# Patient Record
Sex: Male | Born: 1970
Health system: Southern US, Community
[De-identification: ages and names within clinical notes are randomized; demographics above are authoritative.]

## PROBLEM LIST (undated history)

## (undated) DIAGNOSIS — D126 Benign neoplasm of colon, unspecified: Secondary | ICD-10-CM

## (undated) DIAGNOSIS — I10 Essential (primary) hypertension: Secondary | ICD-10-CM

## (undated) HISTORY — PX: POLYPECTOMY: SHX149

---

## 2005-11-18 ENCOUNTER — Emergency Department (HOSPITAL_COMMUNITY): Admission: EM | Admit: 2005-11-18 | Discharge: 2005-11-19 | Payer: Self-pay | Admitting: Emergency Medicine

## 2008-04-20 DIAGNOSIS — I10 Essential (primary) hypertension: Secondary | ICD-10-CM | POA: Insufficient documentation

## 2008-04-27 ENCOUNTER — Emergency Department (HOSPITAL_COMMUNITY): Admission: EM | Admit: 2008-04-27 | Discharge: 2008-04-27 | Payer: Self-pay | Admitting: Emergency Medicine

## 2008-08-11 ENCOUNTER — Encounter (INDEPENDENT_AMBULATORY_CARE_PROVIDER_SITE_OTHER): Payer: Self-pay | Admitting: Family Medicine

## 2008-08-11 ENCOUNTER — Ambulatory Visit: Payer: Self-pay | Admitting: Family Medicine

## 2008-08-11 ENCOUNTER — Observation Stay (HOSPITAL_COMMUNITY): Admission: EM | Admit: 2008-08-11 | Discharge: 2008-08-13 | Payer: Self-pay | Admitting: Emergency Medicine

## 2008-08-13 ENCOUNTER — Encounter (INDEPENDENT_AMBULATORY_CARE_PROVIDER_SITE_OTHER): Payer: Self-pay | Admitting: Family Medicine

## 2008-08-15 DIAGNOSIS — K226 Gastro-esophageal laceration-hemorrhage syndrome: Secondary | ICD-10-CM | POA: Insufficient documentation

## 2008-08-23 ENCOUNTER — Ambulatory Visit: Payer: Self-pay | Admitting: Family Medicine

## 2008-09-22 ENCOUNTER — Ambulatory Visit: Payer: Self-pay | Admitting: Family Medicine

## 2008-09-22 LAB — CONVERTED CEMR LAB
BUN: 18 mg/dL
Basophils Absolute: 0 K/uL
Basophils Relative: 1 %
CO2: 24 meq/L
Calcium: 10.2 mg/dL
Chloride: 101 meq/L
Creatinine, Ser: 1.3 mg/dL
Eosinophils Absolute: 0.3 K/uL
Eosinophils Relative: 5 %
Glucose, Bld: 94 mg/dL
HCT: 46.6 %
Hemoglobin: 15.1 g/dL
Lymphocytes Relative: 41 %
Lymphs Abs: 2.2 K/uL
MCHC: 32.4 g/dL
MCV: 100.2 fL — ABNORMAL HIGH
Monocytes Absolute: 0.4 K/uL
Monocytes Relative: 8 %
Neutro Abs: 2.3 K/uL
Neutrophils Relative %: 45 %
Platelets: 415 K/uL — ABNORMAL HIGH
Potassium: 5.3 meq/L
RBC: 4.65 M/uL
RDW: 13.1 %
Sodium: 140 meq/L
WBC: 5.2 10*3/microliter

## 2008-10-18 ENCOUNTER — Encounter (INDEPENDENT_AMBULATORY_CARE_PROVIDER_SITE_OTHER): Payer: Self-pay | Admitting: Family Medicine

## 2009-04-25 ENCOUNTER — Ambulatory Visit: Payer: Self-pay | Admitting: Physician Assistant

## 2009-04-27 LAB — CONVERTED CEMR LAB
BUN: 13 mg/dL (ref 6–23)
CO2: 16 meq/L — ABNORMAL LOW (ref 19–32)
Calcium: 9.7 mg/dL (ref 8.4–10.5)
Creatinine, Ser: 1.56 mg/dL — ABNORMAL HIGH (ref 0.40–1.50)
Glucose, Bld: 93 mg/dL (ref 70–99)

## 2009-05-02 ENCOUNTER — Ambulatory Visit: Payer: Self-pay | Admitting: Physician Assistant

## 2009-05-05 ENCOUNTER — Encounter (INDEPENDENT_AMBULATORY_CARE_PROVIDER_SITE_OTHER): Payer: Self-pay | Admitting: *Deleted

## 2009-05-09 ENCOUNTER — Ambulatory Visit: Payer: Self-pay | Admitting: Physician Assistant

## 2009-05-09 ENCOUNTER — Encounter (INDEPENDENT_AMBULATORY_CARE_PROVIDER_SITE_OTHER): Payer: Self-pay | Admitting: *Deleted

## 2009-05-09 LAB — CONVERTED CEMR LAB
CO2: 25 meq/L (ref 19–32)
Calcium: 9.8 mg/dL (ref 8.4–10.5)
Creatinine, Ser: 0.98 mg/dL (ref 0.40–1.50)

## 2009-09-05 ENCOUNTER — Telehealth: Payer: Self-pay | Admitting: Physician Assistant

## 2009-09-05 ENCOUNTER — Ambulatory Visit: Payer: Self-pay | Admitting: Physician Assistant

## 2009-09-05 LAB — CONVERTED CEMR LAB
Bilirubin Urine: NEGATIVE
Glucose, Urine, Semiquant: NEGATIVE
Ketones, urine, test strip: NEGATIVE
Nitrite: NEGATIVE
pH: 5.5

## 2009-09-14 ENCOUNTER — Ambulatory Visit: Payer: Self-pay | Admitting: Physician Assistant

## 2009-09-14 LAB — CONVERTED CEMR LAB: Rapid HIV Screen: NEGATIVE

## 2009-09-15 LAB — CONVERTED CEMR LAB
AST: 57 units/L — ABNORMAL HIGH (ref 0–37)
Albumin: 4.8 g/dL (ref 3.5–5.2)
Alkaline Phosphatase: 82 units/L (ref 39–117)
Basophils Absolute: 0 10*3/uL (ref 0.0–0.1)
Glucose, Bld: 73 mg/dL (ref 70–99)
Hemoglobin: 15.7 g/dL (ref 13.0–17.0)
LDL Cholesterol: 122 mg/dL — ABNORMAL HIGH (ref 0–99)
Lymphocytes Relative: 42 % (ref 12–46)
Monocytes Absolute: 0.4 10*3/uL (ref 0.1–1.0)
Neutro Abs: 2.1 10*3/uL (ref 1.7–7.7)
Potassium: 4.7 meq/L (ref 3.5–5.3)
RDW: 14.1 % (ref 11.5–15.5)
Sodium: 139 meq/L (ref 135–145)
TSH: 2.267 microintl units/mL (ref 0.350–4.500)
Total Protein: 8.7 g/dL — ABNORMAL HIGH (ref 6.0–8.3)
Vit D, 25-Hydroxy: 13 ng/mL — ABNORMAL LOW (ref 30–89)
WBC: 4.6 10*3/uL (ref 4.0–10.5)

## 2009-09-19 ENCOUNTER — Encounter (INDEPENDENT_AMBULATORY_CARE_PROVIDER_SITE_OTHER): Payer: Self-pay | Admitting: *Deleted

## 2009-12-08 ENCOUNTER — Encounter (INDEPENDENT_AMBULATORY_CARE_PROVIDER_SITE_OTHER): Payer: Self-pay | Admitting: *Deleted

## 2009-12-21 ENCOUNTER — Encounter (INDEPENDENT_AMBULATORY_CARE_PROVIDER_SITE_OTHER): Payer: Self-pay | Admitting: *Deleted

## 2009-12-26 ENCOUNTER — Ambulatory Visit: Payer: Self-pay | Admitting: Gastroenterology

## 2010-01-16 ENCOUNTER — Ambulatory Visit: Payer: Self-pay | Admitting: Gastroenterology

## 2010-01-18 ENCOUNTER — Encounter: Payer: Self-pay | Admitting: Gastroenterology

## 2010-01-20 ENCOUNTER — Encounter: Payer: Self-pay | Admitting: Physician Assistant

## 2010-01-20 DIAGNOSIS — D126 Benign neoplasm of colon, unspecified: Secondary | ICD-10-CM

## 2010-01-20 HISTORY — DX: Benign neoplasm of colon, unspecified: D12.6

## 2010-02-13 ENCOUNTER — Telehealth: Payer: Self-pay | Admitting: Physician Assistant

## 2010-02-15 ENCOUNTER — Encounter: Payer: Self-pay | Admitting: Physician Assistant

## 2010-03-09 ENCOUNTER — Telehealth (INDEPENDENT_AMBULATORY_CARE_PROVIDER_SITE_OTHER): Payer: Self-pay | Admitting: Nurse Practitioner

## 2010-06-22 NOTE — Progress Notes (Signed)
Summary: GI referral  Phone Note Outgoing Call   Summary of Call: Patient needs referral to GI for consideration of screening colo.  He has a FHx of colon CA (his brother died in his 4s).  Initial call taken by: Tereso Newcomer PA-C,  September 05, 2009 9:55 AM

## 2010-06-22 NOTE — Progress Notes (Signed)
Summary: Office Visit//DEPRESSION SCREENING  Office Visit//DEPRESSION SCREENING   Imported By: Arta Bruce 11/02/2009 15:15:48  _____________________________________________________________________  External Attachment:    Type:   Image     Comment:   External Document

## 2010-06-22 NOTE — Miscellaneous (Signed)
  Clinical Lists Changes  Problems: Added new problem of COLONIC POLYPS, BENIGN (ICD-211.3) - 12/2009: path benign; rpt 2016 Observations: Added new observation of PAST MED HX: Hypertension (04/20/2008) h/o gout Colonoscopy 12/2009:  Polyps (path benign) . . . repeat 2016 (01/20/2010 13:17) Added new observation of PRIMARY MD: Tereso Newcomer PA-C (01/20/2010 13:17)        Past History:  Past Medical History: Hypertension (04/20/2008) h/o gout Colonoscopy 12/2009:  Polyps (path benign) . . . repeat 2016

## 2010-06-22 NOTE — Miscellaneous (Signed)
Summary: DIR COL/Previsit/rm  Clinical Lists Changes  Medications: Added new medication of MOVIPREP 100 GM  SOLR (PEG-KCL-NACL-NASULF-NA ASC-C) As per prep instructions. - Signed Rx of MOVIPREP 100 GM  SOLR (PEG-KCL-NACL-NASULF-NA ASC-C) As per prep instructions.;  #1 x 0;  Signed;  Entered by: Sherren Kerns RN;  Authorized by: Mardella Layman MD [facility];  Method used: Electronically to [facility] [address], [place], [place], [place]  [address], Ph: [phone], Fax: [phone] Observations: Added new observation of ALLERGY REV: Done (12/26/2009 8:34)    Prescriptions: MOVIPREP 100 GM  SOLR (PEG-KCL-NACL-NASULF-NA ASC-C) As per prep instructions.  #1 x 0   Entered by:   Sherren Kerns RN   Authorized by:   Mardella Layman MD [facility]   Signed by:   Sherren Kerns RN on 12/26/2009   Method used:   Electronically to        [facility] (retail)       [address]       [place], [place]  [address]       Ph: [phone]       Fax: [phone]   RxID:   [id number]

## 2010-06-22 NOTE — Letter (Signed)
Summary: Generic Letter  [facility]  [address] [place], [place] [address]   Phone: [phone]  Fax: [phone]        02/15/2010  [name] [name] [address] [place], [place]  [address]  Dear Mr. DANTONIO,  We have been unable to contact you by telephone.  Please call our office, at your earliest convenience, so that we may speak with you about your medication.  Sincerely,   Dutch Quint RN

## 2010-06-22 NOTE — Progress Notes (Signed)
Summary: Query:  Refill lisinopril/HCTZ?  Phone Note Outgoing Call   Summary of Call: Do you want to fill his lisinopril/hctz?  Last seen 08/2009. Initial call taken by: Dutch Quint RN,  February 13, 2010 3:25 PM  Follow-up for Phone Call        last filled in Dec. for 6 mos has he been taking it? Follow-up by: Tereso Newcomer PA-C,  February 14, 2010 1:41 PM  Additional Follow-up for Phone Call Additional follow up Details #1::        Unable to leave message -- no answer.  Dutch Quint RN  February 14, 2010 2:41 PM  Unable to leave message -- no answer.  Letter sent.  Dutch Quint RN  February 15, 2010 10:17 AM

## 2010-06-22 NOTE — Letter (Signed)
Summary: *HSN Results Follow up  [facility]  [address] [place], [place] [address]   Phone: [phone]  Fax: [phone]      09/19/2009   GIDEON BURSTEIN [address] [place], [place]  [address]   Dear  Mr. Jaiel Dunkerson,                            ____S.Drinkard,FNP   ____D. Gore,FNP       ____B. McPherson,MD   ____V. Rankins,MD    ____E. Mulberry,MD    ____N. Daphine Deutscher, FNP  ____D. Reche Dixon, MD    ____K. Philipp Deputy, MD    ____Other     This letter is to inform you that your recent test(s):  _______Pap Smear    _______Lab Test     _______X-ray    _______ is within acceptable limits  _______ requires a medication change  _______ requires a follow-up lab visit  _______ requires a follow-up visit with your provider   Comments:  We have been trying to reach you.  Please give the office a call at your earliest convenience.       _________________________________________________________ If you have any questions, please contact our office                     Sincerely,  Armenia Shannon [facility]

## 2010-06-22 NOTE — Assessment & Plan Note (Signed)
Summary: 4 MONTH FU FOR CPE////KT   Vital Signs:  Patient profile:   40 year old male Height:      76 inches Weight:      210 pounds BMI:     25.65 Temp:     98.2 degrees F oral Pulse rate:   111 / minute Pulse rhythm:   regular Resp:     18 per minute BP sitting:   133 / 84  (left arm) Cuff size:   large  Vitals Entered By: [place] Shannon (September 05, 2009 9:04 AM) CC: CPE..., Hypertension Management Is Patient Diabetic? No Pain Assessment Patient in pain? no       Does patient need assistance? Functional Status Self care Ambulation Normal   Primary Care Provider:  Tereso Newcomer PA-C  CC:  CPE... and Hypertension Management.  History of Present Illness: Here for CPE.  HTN:  Taking meds.  Trying to watch salt intake.    H/o Mallory Weis tear in past:  No vomiting blood.  Health Maint: Td up to date. Needs Lipid, Vit D. Notes h/o colon CA in brother in his 27s.   Hypertension History:      He denies headache, chest pain, palpitations, dyspnea with exertion, and syncope.        Positive major cardiovascular risk factors include hypertension.  Negative major cardiovascular risk factors include male age less than 27 years old and non-tobacco-user status.     Habits & Providers  Alcohol-Tobacco-Diet     Alcohol drinks/day: varies     Alcohol type: beer     Feels need to cut down: yes     Feels annoyed by complaints: no     Feels guilty re: drinking: no     Needs 'eye opener' in am: no     Tobacco Status: never  Exercise-Depression-Behavior     Have you felt down or hopeless? no     Have you felt little pleasure in things? 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 Take 1 Tablet By Mouth Once A Day For Blood Pressure 2)  Robaxin-750 750 Mg Tabs (Methocarbamol) .Marland Kitchen.. 1-2 Tabs By Mouth Every 8 Hours As Needed For Pain  Allergies (verified): No Known Drug Allergies  Past History:  Past Medical History: Hypertension (04/20/2008) h/o gout  Past Surgical History: Reviewed history from 08/23/2008 and no changes required. Denies surgical history  Family History: Mother living with borderline DM Father deceased age 67 ?GI bleed Pt says no alcoholism. Breast CA - mom Colon CA - brother (40) Family History of Colon CA 1st degree relative <60  Social History: Occupation:worked as a pressure washer...currently unemployed Single Never Smoked Alcohol use-yes.Marland KitchenMarland KitchenReports occ. beers per day (drank 6 beers yest) Drug use-no Drug Use:  never Seat Belt Use:  always  Review of Systems      See HPI General:  Denies chills and fever. Eyes:  Denies blurring. ENT:  Denies sore throat. CV:  Denies chest pain or discomfort and shortness of breath with exertion. Resp:  Denies cough. GI:  Denies bloody stools and dark tarry stools. GU:  Denies hematuria. MS:  Complains of joint  pain; c/o gout in left great toe. Derm:  Denies rash. Psych:  Denies depression. Endo:  Denies cold intolerance and heat intolerance. Heme:  Denies bleeding.   Impression & Recommendations:  Problem # 1:  Preventive Health Care (ICD-V70.0)  PHQ9=0 bring back for fasting labs  Orders: UA Dipstick w/o Micro (manual) ([address]) Hemoccult Guaiac-1 spec.(in office) ([address]) Hemoccult Cards -3 specimans (take home) ([address])  Problem # 2:  HYPERTENSION (ICD-401.9) controlled  His updated medication list for this problem includes:    Lisinopril-hydrochlorothiazide 20-25 Mg Tabs (Lisinopril-hydrochlorothiazide) .Marland Kitchen... 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 Take 1 tablet by mouth once a day for blood pressure  Hypertension Assessment/Plan:      The patient's hypertensive risk group is category A: No risk factors and no target organ damage.  Today's blood pressure is 133/84.  His blood pressure goal is < 140/90.  Patient Instructions: 1)  Return to the lab fasting (nothing to eat or drink after midnight the night before except water) in the next 1-2 weeks: 2)  CMET, Lipids, CBC, HIV, TSH, RPR, Vitamin D (25 Hydroxy). 3)  Dx:  V70.0, 401.1 4)  Complete stool cards and send back. 5)  I will set you up to see a Gastroenterologist to discuss doing a colonoscopy.  Somone will call you. 6)  Please schedule a follow-up appointment in 4 months with Scott for blood pressure.    EKG  Procedure date:  09/05/2009  Findings:      Sinus tachycardia with rate of:  110 normal axis LVH j point elevation   Laboratory Results   Urine Tests  Date/Time Received: September 05, 2009 1:46 PM   Routine Urinalysis   Glucose: negative   (Normal Range: Negative) Bilirubin: negative   (Normal Range: Negative) Ketone: negative   (Normal Range: Negative) Spec. Gravity: >=1.030   (Normal Range: 1.003-1.035) Blood: negative   (Normal Range: Negative) pH: 5.5   (Normal Range: 5.0-8.0) Protein: negative   (Normal Range: Negative) Urobilinogen: 0.2   (Normal Range: 0-1) Nitrite: negative   (Normal Range: Negative) Leukocyte Esterace: negative   (Normal Range: Negative)        Appended Document: 4 MONTH FU FOR CPE////KT    Clinical Lists Changes  Orders: Added new  Service order of Est. Patient age 89-64 [phone]) - Signed

## 2010-06-22 NOTE — Progress Notes (Signed)
Summary: Query:  Refill lisinopril-HCTZ?  Phone Note Outgoing Call   Summary of Call: Got request to refill his lisinopril/HCTZ and Lorin Picket wanted to know if he was taking his meds consistently -- pt. states that he has.  Do you want to refill his med? Initial call taken by: Dutch Quint RN,  March 09, 2010 10:18 AM  Follow-up for Phone Call        Yes, fill for 6 months Follow-up by: Julieanne Manson MD,  March 10, 2010 5:47 PM  Additional Follow-up for Phone Call Additional follow up Details #1::        Refill completed.  Left message with family member for pt. to return call.  Dutch Quint RN  March 13, 2010 5:32 PM     Additional Follow-up for Phone Call Additional follow up Details #2::    SPOKE WITH PATIENT AND I INFORMED HIM THAT HIS BP MEDICATION WAS SENT TO [facility]. Follow-up by: Leodis Rains,  March 14, 2010 9:31 AM  Prescriptions: LISINOPRIL-HYDROCHLOROTHIAZIDE 20-25 MG TABS (LISINOPRIL-HYDROCHLOROTHIAZIDE) Take 1 tablet by mouth once a day for blood pressure  #30 x 5   Entered by:   Dutch Quint RN   Authorized by:   Julieanne Manson MD   Signed by:   Dutch Quint RN on 03/13/2010   Method used:   Electronically to        [facility]retail)       [address]       [place], [place]  [address]       Ph: [phone]       Fax: [phone]   RxID:   [id number]

## 2010-06-22 NOTE — Letter (Signed)
Summary: Previsit letter  [facility]  [address] [place], [place] [address]   Phone: [phone]  Fax: [phone]       12/08/2009 MRN: [id number]  [facility] [address] [place], [place]  [address]  Dear Mr. WOLLMAN,  Welcome to the Gastroenterology Division at [facility].    You are scheduled to see a nurse for your pre-procedure visit on 12/26/2009 at 8:30AM on the 3rd floor at [facility], [address]  We ask that you try to arrive at our office 15 minutes prior to your appointment time to allow for check-in.  Your nurse visit will consist of discussing your medical and surgical history, your immediate family medical history, and your medications.    Please bring a complete list of all your medications or, if you prefer, bring the medication bottles and we will list them.  We will need to be aware of both prescribed and over the counter drugs.  We will need to know exact dosage information as well.  If you are on blood thinners (Coumadin, Plavix, Aggrenox, Ticlid, etc.) please call our office today/prior to your appointment, as we need to consult with your physician about holding your medication.   Please be prepared to read and sign documents such as consent forms, a financial agreement, and acknowledgement forms.  If necessary, and with your consent, a friend or relative is welcome to sit-in on the nurse visit with you.  Please bring your insurance card so that we may make a copy of it.  If your insurance requires a referral to see a specialist, please bring your referral form from your primary care physician.  No co-pay is required for this nurse visit.     If you cannot keep your appointment, please call [phone] to cancel or reschedule prior to your appointment date.  This allows [place] the opportunity to schedule an appointment for another patient in need of care.    Thank you for choosing [facility] for your medical  needs.  We appreciate the opportunity to care for you.  Please visit [place] at our website  to learn more about our practice.                     Sincerely.                                                                                                                   The Gastroenterology Division

## 2010-06-22 NOTE — Procedures (Signed)
Summary: Colonoscopy  Patient: Hezakiah Champeau Note: All result statuses are Final unless otherwise noted.  Tests: (1) Colonoscopy (COL)   COL Colonoscopy           DONE     [facility]     [address]     [place], [place]  [address]           COLONOSCOPY PROCEDURE REPORT           PATIENT:  Damichael, Hofman  MR#:  [id number]     BIRTHDATE:  10/31/1970, 38 yrs. old  GENDER:  male     ENDOSCOPIST:  Vania Rea. Jarold Motto, MD, [facility]     REF. BY:     PROCEDURE DATE:  01/16/2010     PROCEDURE:  Colonoscopy with snare polypectomy     ASA CLASS:  Class II     INDICATIONS:  family history of colon cancer BROTHER     MEDICATIONS:   Fentanyl 50 mcg IV, Versed 5 mg IV           DESCRIPTION OF PROCEDURE:   After the risks benefits and     alternatives of the procedure were thoroughly explained, informed     consent was obtained.  Digital rectal exam was performed and     revealed no abnormalities.   The LB [id number] endoscope     was introduced through the anus and advanced to the cecum, which     was identified by both the appendix and ileocecal valve, without     limitations.  The quality of the prep was excellent, using     MoviPrep.  The instrument was then slowly withdrawn as the colon     was fully examined.     <<PROCEDUREIMAGES>>           FINDINGS:  A sessile polyp was found in the ascending colon. 5 MM     POLYP HOT SNARE EXCISED.  This was otherwise a normal examination     of the colon.   Retroflexed views in the rectum revealed no     abnormalities.    The scope was then withdrawn from the patient     and the procedure completed.           COMPLICATIONS:  None     ENDOSCOPIC IMPRESSION:     1) Sessile polyp in the ascending colon     2) Otherwise normal examination     R/O ADENOMA.     RECOMMENDATIONS:     1) Repeat Colonoscopy in 5 years.     REPEAT EXAM:  No           ______________________________     Vania Rea. Jarold Motto, MD, [facility]           CC:   Tereso Newcomer PA           n.     eSIGNED:   Vania Rea. Patterson at 01/16/2010 11:56 AM           Teresa Pelton, [id number]  Note: An exclamation mark (!) indicates a result that was not dispersed into the flowsheet. Document Creation Date: 01/16/2010 11:58 AM _______________________________________________________________________  (1) Order result status: Final Collection or observation date-time: 01/16/2010 11:50 Requested date-time:  Receipt date-time:  Reported date-time:  Referring Physician:   Ordering Physician: Sheryn Bison [phone]) Specimen Source:  Source: Launa Grill Order Number: [id number] Lab site:   Appended Document: Colonoscopy     Procedures Next Due Date:  Colonoscopy: 01/2015

## 2010-06-22 NOTE — Letter (Signed)
Summary: Patient Notice- Polyp Results  [facility]  [address] [place], [place] [address]   Phone: [phone]  Fax: [phone]        January 18, 2010 MRN: [id number]    [name] [name] [address] [place], [place]  [address]    Dear [name] [name],  I am pleased to inform you that the colon polyp(s) removed during your recent colonoscopy was (were) found to be benign (no cancer detected) upon pathologic examination.  I recommend you have a repeat colonoscopy examination in 5_ years to look for recurrent polyps, as having colon polyps increases your risk for having recurrent polyps or even colon cancer in the future.  Should you develop new or worsening symptoms of abdominal pain, bowel habit changes or bleeding from the rectum or bowels, please schedule an evaluation with either your primary care physician or with me.  Additional information/recommendations:  _X_ No further action with gastroenterology is needed at this time. Please      follow-up with your primary care physician for your other healthcare      needs.POLYP WAS HYPERPLASTIC,NOT AN ADENOMA.5 YEAR FOLLOWUP PER FAMILY HISTORY. __ Please call [phone] to schedule a return visit to review your      situation.  __ Please keep your follow-up visit as already scheduled.  __ Continue treatment plan as outlined the day of your exam.  Please call us if you are having persistent problems or have questions about your condition that have not been fully answered at this time.  Sincerely,  Mardella Layman MD [facility]  This letter has been electronically signed by your physician.  Appended Document: Patient Notice- Polyp Results letter mailed 9.2.11

## 2010-06-22 NOTE — Letter (Signed)
Summary: [facility] Instructions  [facility]  [address] [place], [place] [address]   Phone: [phone]  Fax: [phone]       [name] [name]    [birth year]    MRN: [id number]        Procedure Day /Date:  Monday 01/16/2010     Arrival Time: 10:00 am      Procedure Time: 11:00 am     Location of Procedure:                    _x _  [facility] (4th Floor)                        PREPARATION FOR COLONOSCOPY WITH MOVIPREP   Starting 5 days prior to your procedure Wednesday 8/24 do not eat nuts, seeds, popcorn, corn, beans, peas,  salads, or any raw vegetables.  Do not take any fiber supplements (e.g. Metamucil, Citrucel, and Benefiber).  THE DAY BEFORE YOUR PROCEDURE         DATE: Sunday 8/28  1.  Drink clear liquids the entire day-NO SOLID FOOD  2.  Do not drink anything colored red or purple.  Avoid juices with pulp.  No orange juice.  3.  Drink at least 64 oz. (8 glasses) of fluid/clear liquids during the day to prevent dehydration and help the prep work efficiently.  CLEAR LIQUIDS INCLUDE: Water Jello Ice Popsicles Tea (sugar ok, no milk/cream) Powdered fruit flavored drinks Coffee (sugar ok, no milk/cream) Gatorade Juice: apple, white grape, white cranberry  Lemonade Clear bullion, consomm, broth Carbonated beverages (any kind) Strained chicken noodle soup Hard Candy                             4.  In the morning, mix first dose of MoviPrep solution:    Empty 1 Pouch A and 1 Pouch B into the disposable container    Add lukewarm drinking water to the top line of the container. Mix to dissolve    Refrigerate (mixed solution should be used within 24 hrs)  5.  Begin drinking the prep at 5:00 p.m. The MoviPrep container is divided by 4 marks.   Every 15 minutes drink the solution down to the next mark (approximately 8 oz) until the full liter is complete.   6.  Follow completed prep with 16 oz of clear liquid of your choice  (Nothing red or purple).  Continue to drink clear liquids until bedtime.  7.  Before going to bed, mix second dose of MoviPrep solution:    Empty 1 Pouch A and 1 Pouch B into the disposable container    Add lukewarm drinking water to the top line of the container. Mix to dissolve    Refrigerate  THE DAY OF YOUR PROCEDURE      DATE: Monday 8/29  Beginning at 6:00 a.m. (5 hours before procedure):         1. Every 15 minutes, drink the solution down to the next mark (approx 8 oz) until the full liter is complete.  2. Follow completed prep with 16 oz. of clear liquid of your choice.    3. You may drink clear liquids until 9:00 am (2 HOURS BEFORE PROCEDURE).   MEDICATION INSTRUCTIONS  Unless otherwise instructed, you should take regular prescription medications with a small sip of water   as early as possible the morning of  your procedure.   Additional medication instructions: hold BP pill morning of procedure only         OTHER INSTRUCTIONS  You will need a responsible adult at least 42 years of age to accompany you and drive you home.   This person must remain in the waiting room during your procedure.  Wear loose fitting clothing that is easily removed.  Leave jewelry and other valuables at home.  However, you may wish to bring a book to read or  an iPod/MP3 player to listen to music as you wait for your procedure to start.  Remove all body piercing jewelry and leave at home.  Total time from sign-in until discharge is approximately 2-3 hours.  You should go home directly after your procedure and rest.  You can resume normal activities the  day after your procedure.  The day of your procedure you should not:   Drive   Make legal decisions   Operate machinery   Drink alcohol   Return to work  You will receive specific instructions about eating, activities and medications before you leave.    The above instructions have been reviewed and explained to me  by   Sherren Kerns RN  December 26, 2009 8:53 AM    I fully understand and can verbalize these instructions _____________________________ Date _________

## 2010-08-31 LAB — SAMPLE TO BLOOD BANK

## 2010-08-31 LAB — CBC
HCT: 33.1 % — ABNORMAL LOW (ref 39.0–52.0)
HCT: 37.9 % — ABNORMAL LOW (ref 39.0–52.0)
Hemoglobin: 12.8 g/dL — ABNORMAL LOW (ref 13.0–17.0)
Hemoglobin: 15.9 g/dL (ref 13.0–17.0)
MCHC: 33.5 g/dL (ref 30.0–36.0)
MCHC: 33.8 g/dL (ref 30.0–36.0)
MCV: 95.7 fL (ref 78.0–100.0)
MCV: 96.1 fL (ref 78.0–100.0)
Platelets: 246 10*3/uL (ref 150–400)
RBC: 3.45 MIL/uL — ABNORMAL LOW (ref 4.22–5.81)
RBC: 3.95 MIL/uL — ABNORMAL LOW (ref 4.22–5.81)
RBC: 4.98 MIL/uL (ref 4.22–5.81)
RDW: 12.5 % (ref 11.5–15.5)
WBC: 6.3 10*3/uL (ref 4.0–10.5)

## 2010-08-31 LAB — COMPREHENSIVE METABOLIC PANEL
ALT: 39 U/L (ref 0–53)
AST: 20 U/L (ref 0–37)
Albumin: 3.2 g/dL — ABNORMAL LOW (ref 3.5–5.2)
Alkaline Phosphatase: 53 U/L (ref 39–117)
CO2: 25 mEq/L (ref 19–32)
CO2: 26 mEq/L (ref 19–32)
Calcium: 9.4 mg/dL (ref 8.4–10.5)
Chloride: 110 mEq/L (ref 96–112)
Creatinine, Ser: 1.08 mg/dL (ref 0.4–1.5)
Creatinine, Ser: 1.14 mg/dL (ref 0.4–1.5)
GFR calc Af Amer: >60 mL/min (ref >60)
GFR calc non Af Amer: >60 mL/min (ref >60)
GFR calc non Af Amer: >60 mL/min (ref >60)
Glucose, Bld: 119 mg/dL — ABNORMAL HIGH (ref 70–99)
Potassium: 3.6 mEq/L (ref 3.5–5.1)
Total Bilirubin: 0.9 mg/dL (ref 0.3–1.2)

## 2010-08-31 LAB — HEMOGLOBIN
Hemoglobin: 11.1 g/dL — ABNORMAL LOW (ref 13.0–17.0)
Hemoglobin: 11.8 g/dL — ABNORMAL LOW (ref 13.0–17.0)

## 2010-08-31 LAB — DIFFERENTIAL
Basophils Absolute: 0 10*3/uL (ref 0.0–0.1)
Eosinophils Absolute: 0.1 10*3/uL (ref 0.0–0.7)
Lymphs Abs: 2.3 10*3/uL (ref 0.7–4.0)
Neutrophils Relative %: 53 % (ref 43–77)

## 2010-08-31 LAB — CROSSMATCH

## 2010-08-31 LAB — PROTIME-INR
INR: 1.1 (ref 0.00–1.49)
Prothrombin Time: 14 seconds (ref 11.6–15.2)

## 2010-08-31 LAB — HEMATOCRIT: HCT: 32 % — ABNORMAL LOW (ref 39.0–52.0)

## 2010-08-31 LAB — POCT I-STAT, CHEM 8
BUN: 31 mg/dL — ABNORMAL HIGH (ref 6–23)
Chloride: 106 mEq/L (ref 96–112)
Creatinine, Ser: 1.1 mg/dL (ref 0.4–1.5)
Glucose, Bld: 117 mg/dL — ABNORMAL HIGH (ref 70–99)
Potassium: 4.1 mEq/L (ref 3.5–5.1)
Sodium: 141 mEq/L (ref 135–145)

## 2010-10-03 NOTE — Op Note (Signed)
NAME:  [name] [name], [name] [name] NO.:  [id number]   MEDICAL RECORD NO.:  [id number]          PATIENT TYPE:  OBV   LOCATION:  5509                         FACILITY:  [facility]   PHYSICIAN:  Graylin Shiver, M.D.   DATE OF BIRTH:  15-Jul-1970   DATE OF PROCEDURE:  DATE OF DISCHARGE:                               OPERATIVE REPORT   Esophagogastroduodenoscopy with control of hemorrhage.   INDICATIONS FOR PROCEDURE:  Hematemesis.   Informed consent was obtained after explanation of the risks of  bleeding, infection, and perforation.   PREMEDICATIONS:  1. Fentanyl 100 mcg IV.  2. Versed 10 mg IV.  3. Benadryl 25 mg IV.   PROCEDURE:  With the patient in the left lateral decubitus position, the  Pentax gastroscope was inserted into the oropharynx and passed into the  esophagus.  It was advanced down the esophagus and then into the stomach  and into the duodenum.  The second portion and bulb of the duodenum  looked normal.  The stomach had blood in it.  No lesions were seen in  the antrum or in the body of the stomach that could be seen, but again  there was blood and clot in the stomach.  Upon inspection of the fundus  and cardia, it was noted that there was an adherent clot right at the  level of the esophagogastric junction, and just below the level of the  esophagogastric junction on the lesser curvature side, there was an area  where I could see bleeding.  This is suspect for a Mallory-Weiss tear.  Because of the ongoing bleeding, I injected this area with 5 mL of  1:10,000 epinephrine and the bleeding stopped.  No other bleeding was  seen.  I did not see a specific point to clip.  It was just bleeding  from around this area just beyond the esophagogastric junction.  The  esophagus otherwise looked normal.  He tolerated the procedure well  without complications.   IMPRESSION:  Active bleeding from a suspect Mallory-Weiss tear, injected  with 5 mL of 1:10,000 epinephrine and  bleeding stopped.           ______________________________  Graylin Shiver, M.D.     SFG/MEDQ  D:  08/12/2008  T:  08/12/2008  Job:  [id number]

## 2010-10-03 NOTE — Discharge Summary (Signed)
NAMEMarland Kitchen  [name], [name] NO.:  [id number]   MEDICAL RECORD NO.:  [id number]          PATIENT TYPE:  OBV   LOCATION:  5509                         FACILITY:  [facility]   PHYSICIAN:  [name] Ramp, MD        DATE OF BIRTH:  25-Oct-1970   DATE OF ADMISSION:  08/11/2008  DATE OF DISCHARGE:  08/13/2008                               DISCHARGE SUMMARY   PRIMARY CARE [name] [name]:  Dr. Luciana [name] at [facility].   DISCHARGE DIAGNOSES:  1. Mallory-Weiss tear at the esophagogastric junction.  2. Hypertension.  3. Alcohol abuse.   DISCHARGE MEDICATIONS:  1. Enalapril 5 mg p.o. daily.  2. Hydrochlorothiazide 12.5 mg p.o. daily.  3. Multivitamin p.o. daily.   The patient is instructed to not use any NSAIDs such as Advil, Motrin,  ibuprofen, etc, only use Tylenol for headache.   CONSULTS:  The patient had a GI consult and EGD on August 11, 2008.   The patient had no radiology images done, but he did have an EGD done on  August 11, 2008.  He was found to have a Mallory-Weiss tear at the EG  suction.  It was injected with lidocaine and the patient had no further  bleeding during his hospitalization.  This was done by Dr. Evette [name].   LABORATORY DATA PERTINENT DURING THIS HOSPITALIZATION:  The patient was  admitted.  He was fecal occult blood positive.  He had a CBC that showed  that he had a white blood cell count of 6.1, hemoglobin of 15.9,  hematocrit 47.7, and platelets of 332.  The patient had a PT of 14 and  INR of 1.1 and PTT of 30.  He had a comprehensive metabolic panel that  showed that his BUN was little elevated at 24, glucose was 119, and his  total bilirubin was 1.5, otherwise within normal limits.  His alcohol  level was less than 5.  He was typed and crossed and the patient has O+  blood type.  The patient continued to have a CBCs and  hemoglobin/hematocrit checks throughout his hospitalization on the day  prior to hospitalization.  The patient's complete metabolic panel was  only significant for a glucose of 100 and albumin of 3.2, otherwise was  completely normal level.  The patient had a final hemoglobin on the day  of discharge on August 13, 2008, with a hemoglobin of 9.7.  This had  dropped down from his admission hemoglobin of 15.9.  However, on the day  of discharge, the patient had stopped bleeding and was recommended to  start on a multivitamin which will get a prescription for.   BRIEF HOSPITAL COURSE AND SUMMARY:  This is a 40 year old African  American male with a history of hypertension and alcohol use who had  presented with hematemesis and black-tarry stools since the 23rd.  He  had vomited red blood 3 times prior to admission and has had 9 black  stools.  The patient has a history of alcohol abuse.  He admits to  drinking 3-4 beers a day, sometimes he only drinks 1 beer a  day.  1. The patient had a GI bleed.  It was an upper GI bleed.  The patient      was taken straight from the emergency room to the GI Lab where the      patient had an upper endoscopy.  The patient was found to have      Mallory-Weiss tear at the EG junction.  The patient had lidocaine      injected into the site of bleeding.  The bleeding did resolve and      the patient was kept n.p.o. until the next day where he was started      on clears and transitioned to a soft diet and is now able to eat a      full diet.  He has no pain.  He has had no further hematemesis.  He      has had normal brown-colored stools.  The patient's hemoglobin at      the time of discharge was 9.7.  The patient did not have      transfusion.  2. Hypertension.  The patient has a history of hypertension that has      been uncontrolled.  He has not been on his medications due to not      being able to get an appointment with his primary care [name] [name]      soon enough he ran out of his medications, but did have an      appointment during this hospital stay.  I did call his clinic and      tell them that  he was in the hospital and rescheduled his      appointment for followup and he was also provided with a month's      supply of his hypertensive medications.  His blood pressures did      come back into normal range when put on his home dose of enalapril      and hydrochlorothiazide.  3. Alcohol abuse.  The patient has a history of alcohol abuse and is      most likely the cause of his GI bleeding.  He did have a consult      with social work to give him resources to stop.  The patient denied      feeling guilty or having eye-openers or having any depression      associated with the alcohol.   DISCHARGE INSTRUCTIONS:  The patient is to return to the ER if he has  any further bleeding or black stools.  He is to make sure to follow up  with his primary care [name] [name].  This will be Dr. Luciana [name] at [facility],  number there is [phone].  He has an appointment on Sep 22, 2008, at  3:30. The patient was discharged home in stable medical condition.      [name] Brookes, MD  Electronically Signed      [name] Ramp, MD  Electronically Signed    AS/MEDQ  D:  08/13/2008  T:  08/14/2008  Job:  [id number]   cc:   Dr. Ethelle [name], M.D.

## 2010-10-03 NOTE — H&P (Signed)
NAME:  [name] [name], [name] [name] NO.:  [id number]   MEDICAL RECORD NO.:  [id number]          PATIENT TYPE:  OBV   LOCATION:  5509                         FACILITY:  [facility]   PHYSICIAN:  Nestor Ramp, MD        DATE OF BIRTH:  [birth year]   DATE OF ADMISSION:  08/11/2008  DATE OF DISCHARGE:                              HISTORY & PHYSICAL   CHIEF COMPLAINT:  Hematemesis and melena.   HISTORY OF PRESENT ILLNESS:  The patient is a 40 year old African  American male with a history of hypertension and alcohol use here with  hematemesis, melena, since yesterday.  He vomited red blood x3, the last  one being at 8:30 this morning.  He also had loose, dark black stools  nighttime since last night, with the last one also being around 8:30  this morning.  He denies any lightheadedness and denies dizziness.  He  says he has never had any bleeding before in his stool and never had any  throwing up with a blood in it.  Denies any black-colored stools in the  past.  He does often drink 3-4 beers per day but only drank one  yesterday.  He ate half of the can of finger chicken soup yesterday  morning but has not had any since that time because of some decreased  appetite and feeling bad.  He currently denies any recent NSAID use.  He  said he did use ibuprofen as needed about 1-2 months ago but ran out  about that time.  Denies any other over-the-counter medications.  He  says his abdomen does feel sore.  Denies any fevers.  He does have a  headache, however.  He did drink a bottle of water this morning  approximately 4-5 a.m. and kept it down until 8:30 when he threw up.  Of  note, his brother had colon cancer and died around age 22 of this.  The  patient has never had a colonoscopy.  He denies any recent weight loss,  usually has a good appetite.  Denies night sweats.  He was given Zofran  4 mg x1 and Protonix 40 mg x1 as well as normal saline bolus at 500 mL  and then rendered 125 mL an  hour in the ED.   REVIEW OF SYSTEMS:  Please see HPI.  Also of note, the patient does not  have any shortness of breath.  Denies chest pain.  He ran out of blood  pressure medications 1-2 months ago and has not had anyone to refill  them.  Denies dysuria, denies hematuria, denies history of heartburn.   PAST MEDICAL HISTORY:  1. Hypertension.  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  3. Alcohol use.  4. Marijuana use.   ALLERGIES:  No known drug allergies.   MEDICATIONS:  1. Hydrochlorothiazide 25 mg 1/2 tablet daily.  2. Enalapril 5 mg 1 tablet daily.   The patient also has medication bottles for ibuprofen 800 mg and Vicodin  5/500, but says he has been out of these for a few months as well as  the  rest of his medications.   FAMILY HISTORY:  Brother died at 77 of colon cancer.  Father had a CVA.  Wife also says that he died at 56 from some kind of internal bleeding,  was not on blood thinners according to his wife and also had prostate  cancer.  Mother with history of diabetes, breast cancer, hypertension,  and hyperlipidemia.  Other siblings are alive and well.   SOCIAL HISTORY:  The patient lives in [place] by himself.  He is not married.  He is self-employed as a  pressure washer.  He drinks 3-4 beers a day and sometimes, yesterday he  drank 1 beer.  Denies feeling guilty about his alcohol use.  Denies  having any eye openers.  Denies depression associated with this.  Denies  history of tobacco, drug use, or marijuana at times.  No cocaine use.   PHYSICAL EXAMINATION:  VITAL SIGNS:  Temperature 97.5, heart rate 135-  140, respiratory rate 18-20, blood pressure 151-153/110-113, and 97% on  room air.  GENERAL:  The patient is alert and in no acute distress.  He is laying  in bed.  HEENT:  Pupils equal, round, and reactive to light.  Extraocular muscles  intact.  If any, he might have a slight little bit of scleral icterus.  Oropharynx:   No exudates or erythema.  Mucous membranes are moist.  NECK:  No lymphadenopathy.  CARDIOVASCULAR:  Heart tachycardic with no murmurs, rubs, or gallops.  PULMONARY:  Lungs clear to auscultation bilaterally.  ABDOMEN:  Soft, nondistended, mild tenderness in the midepigastric  region.  No rebound, no guarding, and no hepatosplenomegaly.  EXTREMITIES:  2+ dorsalis pedis pulses.  No clubbing, cyanosis, or  edema.  MUSCULOSKELETAL:  Good 5/5 strength upper and lower extremities.  NEUROLOGIC:  Alert and oriented x3.   LABORATORY DATA:  White blood cell count 6.1, hemoglobin 15.9,  hematocrit 47.7, and platelets 332.  Sodium 137, potassium 4, chloride  102, bicarb 25, BUN 24, creatinine 1.14, and glucose 119.  Neutrophil  percentage 53%, lymphocyte percentage 37%, and alcohol level less than  5.  Calcium 9.4, total protein 7.5, and albumin 4.1.  AST 34 and ALT 39.  PTT 30.  INR 1.1.  Fecal occult blood positive.  Total bili 1.5. ALP 78.   ASSESSMENT AND PLAN:  The patient is a 40 year old African American male  with a history of hypertension and alcohol use who was admitted for  gastrointestinal bleeding likely due to an upper gastrointestinal bleed.  1. Gastrointestinal bleed.  This seems to be due to an upper GI source      given his hematemesis and his melena.  Differential includes      esophageal varices, Mallory-Weiss tear, peptic ulcer disease, and      AVMs, most likely this is secondary to patient's alcohol use.  We      will give Zofran p.r.n. nausea and start Protonix 40 mg IV b.i.d.      The patient has already received a first dose of Protonix in the      ED.  The patient has not eaten since 6:00 a.m., so we may be able      to consult GI and have an EGD today.  We will check H. pylori to      rule out an ulcer causing his bleeding if he is not going to have      the EGD and be tested  then.  GI has now been consulted and will      consider scoping him today.  We will also give  him one more bolus      of normal saline 1 L and continue him on fluids as hemoglobin is      currently stable.  We will recheck this evening.  2. Hypertension.  This is uncontrolled due to him not being on his      medications.  We will restart his enalapril and hydrochlorothiazide      at prior doses.  3. Alcohol use.  This may be the cause of his GI bleeding.  We will      consult social work to give him resources to help him stop this.      Also advised him he will need to stop as well.  Ativan withdrawal      protocol.  4. Fluids, electrolytes, nutrition, and gastrointestinal.  We will      keep the patient n.p.o. until seen by a GI, continue fluids, give      one more liter of normal saline as he is still tachycardic likely      due to dehydration.  Check again in      a.m.  5. Disposition.  The patient is full code.  If EGD is okay and GI      approves, it will be great for him to make it final tomorrow at      10:45 a.m., if not this will need to be rescheduled by our group      prior to discharge.      Alanda Amass, M.D.  Electronically Signed      Nestor Ramp, MD  Electronically Signed    JH/MEDQ  D:  08/11/2008  T:  08/12/2008  Job:  [id number]

## 2010-10-03 NOTE — Consult Note (Signed)
NAME:  [name] [name], [name] [name] NO.:  [id number]   MEDICAL RECORD NO.:  [id number]          PATIENT TYPE:  OBV   LOCATION:  5509                         FACILITY:  [facility]   PHYSICIAN:  Graylin Shiver, M.D.   DATE OF BIRTH:  04-06-1971   DATE OF CONSULTATION:  08/11/2008  DATE OF DISCHARGE:                                 CONSULTATION   PRIMARY CARE PHYSICIAN:  [facility].  He was due to have his first  appointment with [facility] tomorrow, August 12, 2008.   We were asked to see Mr. Bougie today in consultation for upper GI  bleeding by the [facility].   HISTORY OF PRESENT ILLNESS:  This is a 40 year old African American male  who reports that he became nauseated yesterday morning.  He began having  vomiting and black stools.  His vomiting was initially fluid with dark  clots and then became bright red.  This morning, his emesis is bright  red.  His stools turned dark yesterday and he began having episodes of  black diarrhea.  He reports having about 9 such episodes overnight.   The patient denies any previous history of these symptoms prior to August 10, 2008.  He also denies any history of heartburn, abdominal pain,  diarrhea, or NSAID use.   Family history is significant for GI cancer.   PAST MEDICAL HISTORY:  1. Degenerative joint disease.  2. Hypertension.   CURRENT MEDICATIONS:  Hydrochlorothiazide and enalapril.   He has no known drug allergies.   Review of systems is negative for weight loss, shortness of breath,  palpitations, or fever.   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  He tells me  that he has about 3 beers a day, sometimes more, sometimes less.  He  uses marijuana occasionally, but not recently.  He has no tobacco use.  No IV drug use.   Family history is significant for his brother who was diagnosed with  colon cancer, 42 year old and is now passed away.  His father died of  some  type of GI perforation.  His mother is alive and well.  She is a  breast cancer survivor.   PHYSICAL EXAMINATION:  GENERAL:  He is alert and oriented in no apparent  distress.  He does have mild icterus.  VITAL SIGNS:  Temperature 98.2, respirations 18, blood pressure 150/93,  pulse 125.  HEART:  Tachy with no obvious murmurs, rubs, or gallops.  LUNGS:  No wheezes or no crackles, but he does have decreased breath  sounds.  ABDOMEN:  Soft, nontender, nondistended with good bowel sounds.   Labs show a hemoglobin of 17.3, hematocrit 51.0, white count 6.1,  platelets 332,000.  PT is 14, INR 1.0, PTT 30, BUN 31, creatinine 1.1,  glucose 117.  He is guaiac positive.   ASSESSMENT:  Dr. Herbert Moors was seen and examined the patient,  collected history and reviewed his chart.  His impression is this is a  40 year old gentleman with upper gastrointestinal bleeding of uncertain  etiology, also quite dehydrated.  We will  plan for upper EGD with Dr.  Evette Cristal today.  Continue IV PPI therapy and IV fluids.  No nonsteroidal  anti-inflammatory drugs.  We will check amylase and lipase.  We will  plan for colonoscopy this hospitalization.      Stephani Police, PA    ______________________________  Graylin Shiver, M.D.    MLY/MEDQ  D:  08/11/2008  T:  08/12/2008  Job:  [id number]   cc:   Melvern Banker

## 2011-05-30 ENCOUNTER — Encounter (HOSPITAL_COMMUNITY): Payer: Self-pay | Admitting: *Deleted

## 2011-05-30 ENCOUNTER — Emergency Department (HOSPITAL_COMMUNITY)
Admission: EM | Admit: 2011-05-30 | Discharge: 2011-05-30 | Disposition: A | Discharge status: Home / Self Care | Payer: Self-pay | Attending: Emergency Medicine | Admitting: Emergency Medicine

## 2011-05-30 ENCOUNTER — Emergency Department (HOSPITAL_COMMUNITY): Payer: Self-pay

## 2011-05-30 DIAGNOSIS — N453 Epididymo-orchitis: Secondary | ICD-10-CM

## 2011-05-30 DIAGNOSIS — I10 Essential (primary) hypertension: Secondary | ICD-10-CM | POA: Insufficient documentation

## 2011-05-30 DIAGNOSIS — N452 Orchitis: Secondary | ICD-10-CM | POA: Insufficient documentation

## 2011-05-30 DIAGNOSIS — R300 Dysuria: Secondary | ICD-10-CM | POA: Insufficient documentation

## 2011-05-30 DIAGNOSIS — N509 Disorder of male genital organs, unspecified: Secondary | ICD-10-CM | POA: Insufficient documentation

## 2011-05-30 DIAGNOSIS — N5089 Other specified disorders of the male genital organs: Secondary | ICD-10-CM | POA: Insufficient documentation

## 2011-05-30 HISTORY — DX: Essential (primary) hypertension: I10

## 2011-05-30 LAB — URINE MICROSCOPIC-ADD ON

## 2011-05-30 LAB — URINALYSIS, ROUTINE W REFLEX MICROSCOPIC
Nitrite: NEGATIVE
Protein, ur: 30 mg/dL — AB
Specific Gravity, Urine: 1.013 (ref 1.005–1.030)
Urobilinogen, UA: 0.2 mg/dL (ref 0.0–1.0)

## 2011-05-30 LAB — URINE CULTURE

## 2011-05-30 MED ORDER — DOXYCYCLINE HYCLATE 100 MG PO TABS: 100.0000 mg | ORAL_TABLET | Freq: Two times a day (BID) | ORAL | Status: AC

## 2011-05-30 MED ORDER — DOXYCYCLINE HYCLATE 100 MG PO TABS: 100.0000 mg | ORAL_TABLET | Freq: Two times a day (BID) | ORAL | Status: DC

## 2011-05-30 MED ORDER — DOXYCYCLINE HYCLATE 100 MG PO TABS
100.0000 mg | ORAL_TABLET | Freq: Once | ORAL | Status: AC
  Administered 2011-05-30: 100 mg via ORAL
  Filled 2011-05-30: qty 1

## 2011-05-30 MED ORDER — HYDROCODONE-ACETAMINOPHEN 5-325 MG PO TABS
ORAL_TABLET | ORAL | Status: AC
Start: 2011-05-30 — End: 2011-06-09

## 2011-05-30 MED ORDER — CEFTRIAXONE SODIUM 250 MG IJ SOLR
250.0000 mg | Freq: Once | INTRAMUSCULAR | Status: AC
  Administered 2011-05-30: 250 mg via INTRAMUSCULAR
  Filled 2011-05-30: qty 250

## 2011-05-30 NOTE — ED Notes (Signed)
Pt reports pain to lower abdomen radiating to right testicle. States right testicle swollen.

## 2011-05-30 NOTE — ED Notes (Signed)
MD/Allen, RN at bedside.

## 2011-05-30 NOTE — ED Notes (Signed)
Pt hypertensive upon arrival, notes that he has not taken his BP meds in some time

## 2011-05-30 NOTE — ED Notes (Signed)
Patient transported to Ultrasound 

## 2011-05-30 NOTE — ED Notes (Signed)
Pt returned from US

## 2011-05-30 NOTE — ED Provider Notes (Signed)
History     CSN: [id number]  Arrival date & time 05/30/11  1002    Chief Complaint  Patient presents with  . Testicle Pain   HPI Pt was seen at 1020.  Per pt, c/o gradual onset and persistence of constant right scrotal "pain" and "swelling" x3 days.  Has been assoc with mild dysuria.  Denies abd pain, no back pain, no fevers, no N/V/D, no trauma, no hematuria.    Past Medical History  Diagnosis Date  . Hypertension     History reviewed. No pertinent past surgical history.   History  Substance Use Topics  . Smoking status: Never Smoker   . Smokeless tobacco: Not on file  . Alcohol Use: Yes    Review of Systems ROS: Statement: All systems negative except as marked or noted in the HPI; Constitutional: Negative for fever and chills. ; ; Eyes: Negative for eye pain, redness and discharge. ; ; ENMT: Negative for ear pain, hoarseness, nasal congestion, sinus pressure and sore throat. ; ; Cardiovascular: Negative for chest pain, palpitations, diaphoresis, dyspnea and peripheral edema. ; ; Respiratory: Negative for cough, wheezing and stridor. ; ; Gastrointestinal: Negative for nausea, vomiting, diarrhea, abdominal pain, blood in stool, hematemesis, jaundice and rectal bleeding. . ; ; Genitourinary: +dysuria.  Negative for flank pain and hematuria. Genital:  No penile drainage or rash, +right testicular/scrotal pain and swelling.; Musculoskeletal: Negative for back pain and neck pain. Negative for swelling and trauma.; ; Skin: Negative for pruritus, rash, abrasions, blisters, bruising and skin lesion.; ; Neuro: Negative for headache, lightheadedness and neck stiffness. Negative for weakness, altered level of consciousness , altered mental status, extremity weakness, paresthesias, involuntary movement, seizure and syncope.     Allergies  Review of patient's allergies indicates no known allergies.  Home Medications  No current outpatient prescriptions on file.  BP 164/107  Pulse 105   Temp(Src) 98.3 F (36.8 C) (Oral)  Resp 18  SpO2 99%  Physical Exam 1025: Physical examination:  Nursing notes reviewed; Vital signs and O2 SAT reviewed;  Constitutional: Well developed, Well nourished, Well hydrated, In no acute distress; Head:  Normocephalic, atraumatic; Eyes: EOMI, PERRL, No scleral icterus; ENMT: Mouth and pharynx normal, Mucous membranes moist; Neck: Supple, Full range of motion, No lymphadenopathy; Cardiovascular: Regular rate and rhythm, No murmur, rub, or gallop; Respiratory: Breath sounds clear & equal bilaterally, No rales, rhonchi, wheezes, or rub, Normal respiratory effort/excursion; Chest: Nontender, Movement normal; Abdomen: Soft, Nontender, Nondistended, Normal bowel sounds, no palp hernias;  Genital exam performed with pt permission and male ED RN chaparone present during exam.  Pt examined laying and standing.  No perineal erythema.  No penile lesions or drainage.  +right scrotal area with firm edema, mild overlying erythema and tenderness to palp.  No left scrotal erythema, edema or tenderness to palp.  Normal testicular lie.  No testicular tenderness to palp.  +cremasteric reflexes bilat.  No inguinal LAN or palpable masses. Genitourinary: No CVA tenderness; Extremities: Pulses normal, No tenderness, No edema, No calf edema or asymmetry.; Neuro: AA&Ox3, Major CN grossly intact.  No gross focal motor or sensory deficits in extremities.; Skin: Color normal, Warm, Dry, no rash.    ED Course  Procedures   MDM  MDM Reviewed: nursing note and vitals Interpretation: labs and ultrasound   Results for orders placed during the hospital encounter of 05/30/11  URINALYSIS, ROUTINE W REFLEX MICROSCOPIC      Component Value Range   Color, Urine YELLOW  YELLOW  APPearance CLOUDY (*) CLEAR    Specific Gravity, Urine 1.013  1.005 - 1.030    pH 5.5  5.0 - 8.0    Glucose, UA NEGATIVE  NEGATIVE (mg/dL)   Hgb urine dipstick MODERATE (*) NEGATIVE    Bilirubin Urine NEGATIVE   NEGATIVE    Ketones, ur NEGATIVE  NEGATIVE (mg/dL)   Protein, ur 30 (*) NEGATIVE (mg/dL)   Urobilinogen, UA 0.2  0.0 - 1.0 (mg/dL)   Nitrite NEGATIVE  NEGATIVE    Leukocytes, UA LARGE (*) NEGATIVE   URINE MICROSCOPIC-ADD ON      Component Value Range   Squamous Epithelial / LPF RARE  RARE    WBC, UA TOO NUMEROUS TO COUNT  <3 (WBC/hpf)   RBC / HPF 7-10  <3 (RBC/hpf)   Bacteria, UA FEW (*) RARE    US Scrotum 05/30/2011  *RADIOLOGY REPORT*  Clinical Data:  41 year old male with right testicle pain.  Scrotal pain.  SCROTAL ULTRASOUND DOPPLER ULTRASOUND OF THE TESTICLES  Technique: Complete ultrasound examination of the testicles, epididymis, and other scrotal structures was performed.  Color and spectral Doppler ultrasound were also utilized to evaluate blood flow to the testicles.  Comparison:  None.  Findings:  Right testis:  Right testicular echotexture is preserved.  The testicle measures 4.4 x 2.8 x 3.7 cm.  Left testis:  Normal echotexture.  4.5 x 2.9 x 3.7 cm.  Right epididymis:  Enlarged and indistinct.  Left epididymis:  Also somewhat enlarged and indistinct, but without hypervascularity on Doppler interrogation.  Hydocele:  Small right hydrocele with echogenic debris.  Varicocele:  Absent.  Pulsed Doppler interrogation of both testes demonstrates low resistance flow bilaterally. Hypervascular flow is noted in the ventral aspect of the right testis and throughout the enlarged and indistinct right epididymis.  IMPRESSION: 1.  Findings compatible with acute epididymoorchitis on the right. Associated small and slightly complex hydrocele. 2.  Normal left testis.  Left epididymis somewhat enlarged but without hypervascularity to suggest acute inflammation.  Study discussed by telephone with Dr. Samuel Jester on 05/30/2011 at 1215 hours.  Original Report Authenticated By: Harley Hallmark, M.D.   [place] Art/ven Flow Abd Pelv Doppler 05/30/2011  *RADIOLOGY REPORT*  Clinical Data:  41 year old male with  right testicle pain.  Scrotal pain.  SCROTAL ULTRASOUND DOPPLER ULTRASOUND OF THE TESTICLES  Technique: Complete ultrasound examination of the testicles, epididymis, and other scrotal structures was performed.  Color and spectral Doppler ultrasound were also utilized to evaluate blood flow to the testicles.  Comparison:  None.  Findings:  Right testis:  Right testicular echotexture is preserved.  The testicle measures 4.4 x 2.8 x 3.7 cm.  Left testis:  Normal echotexture.  4.5 x 2.9 x 3.7 cm.  Right epididymis:  Enlarged and indistinct.  Left epididymis:  Also somewhat enlarged and indistinct, but without hypervascularity on Doppler interrogation.  Hydocele:  Small right hydrocele with echogenic debris.  Varicocele:  Absent.  Pulsed Doppler interrogation of both testes demonstrates low resistance flow bilaterally. Hypervascular flow is noted in the ventral aspect of the right testis and throughout the enlarged and indistinct right epididymis.  IMPRESSION: 1.  Findings compatible with acute epididymoorchitis on the right. Associated small and slightly complex hydrocele. 2.  Normal left testis.  Left epididymis somewhat enlarged but without hypervascularity to suggest acute inflammation.  Study discussed by telephone with Dr. Samuel Jester on 05/30/2011 at 1215 hours.  Original Report Authenticated By: Harley Hallmark, M.D.     12:56 PM:  UC pending. GC/chlam pending.  Will tx here with rocephin IM and PO doxy, rx doxy x14 days.  Dx testing d/w pt.  Questions answered.  Verb understanding, agreeable to d/c home with outpt f/u.        Chana Lindstrom Allison Quarry, DO 06/02/11 0000

## 2011-05-31 LAB — GC/CHLAMYDIA PROBE AMP, GENITAL
Chlamydia, DNA Probe: NEGATIVE
GC Probe Amp, Genital: NEGATIVE

## 2011-05-31 NOTE — ED Notes (Signed)
Pt called to say that doxycycline is too expensive (states $90.00). Chart reviewed with Cherrie Distance, PA-C who states since gonorrhea and chlamydia cultures are negative, omit doxycycline. Pt informed and instructed to return to ED or see his MD as needed.

## 2013-04-12 ENCOUNTER — Emergency Department (HOSPITAL_COMMUNITY): Payer: Self-pay

## 2013-04-12 ENCOUNTER — Encounter (HOSPITAL_COMMUNITY): Payer: Self-pay | Admitting: Emergency Medicine

## 2013-04-12 ENCOUNTER — Emergency Department (HOSPITAL_COMMUNITY)
Admission: EM | Admit: 2013-04-12 | Discharge: 2013-04-12 | Disposition: A | Discharge status: Home / Self Care | Payer: Self-pay | Attending: Emergency Medicine | Admitting: Emergency Medicine

## 2013-04-12 DIAGNOSIS — IMO0002 Reserved for concepts with insufficient information to code with codable children: Secondary | ICD-10-CM | POA: Insufficient documentation

## 2013-04-12 DIAGNOSIS — S6390XA Sprain of unspecified part of unspecified wrist and hand, initial encounter: Secondary | ICD-10-CM | POA: Insufficient documentation

## 2013-04-12 DIAGNOSIS — I10 Essential (primary) hypertension: Secondary | ICD-10-CM | POA: Insufficient documentation

## 2013-04-12 DIAGNOSIS — Y9389 Activity, other specified: Secondary | ICD-10-CM | POA: Insufficient documentation

## 2013-04-12 DIAGNOSIS — Y92410 Unspecified street and highway as the place of occurrence of the external cause: Secondary | ICD-10-CM | POA: Insufficient documentation

## 2013-04-12 DIAGNOSIS — R000 Tachycardia, unspecified: Secondary | ICD-10-CM | POA: Insufficient documentation

## 2013-04-12 DIAGNOSIS — S43402A Unspecified sprain of left shoulder joint, initial encounter: Secondary | ICD-10-CM

## 2013-04-12 DIAGNOSIS — S6392XA Sprain of unspecified part of left wrist and hand, initial encounter: Secondary | ICD-10-CM

## 2013-04-12 MED ORDER — ONDANSETRON HCL 4 MG/2ML IJ SOLN
4.0000 mg | Freq: Once | INTRAMUSCULAR | Status: AC
  Administered 2013-04-12: 4 mg via INTRAVENOUS
  Filled 2013-04-12: qty 2

## 2013-04-12 MED ORDER — MORPHINE SULFATE 4 MG/ML IJ SOLN
4.0000 mg | Freq: Once | INTRAMUSCULAR | Status: AC
  Administered 2013-04-12: 4 mg via INTRAVENOUS
  Filled 2013-04-12: qty 1

## 2013-04-12 MED ORDER — SODIUM CHLORIDE 0.9 % IV BOLUS (SEPSIS)
1000.0000 mL | Freq: Once | INTRAVENOUS | Status: AC
  Administered 2013-04-12: 1000 mL via INTRAVENOUS

## 2013-04-12 MED ORDER — KETOROLAC TROMETHAMINE 30 MG/ML IJ SOLN
30.0000 mg | Freq: Once | INTRAMUSCULAR | Status: AC
  Administered 2013-04-12: 30 mg via INTRAVENOUS
  Filled 2013-04-12: qty 1

## 2013-04-12 MED ORDER — OXYCODONE-ACETAMINOPHEN 5-325 MG PO TABS
1.0000 | ORAL_TABLET | Freq: Once | ORAL | Status: AC
  Administered 2013-04-12: 1 via ORAL
  Filled 2013-04-12: qty 1

## 2013-04-12 MED ORDER — HYDROCODONE-ACETAMINOPHEN 5-325 MG PO TABS: 1.0000 | ORAL_TABLET | Freq: Four times a day (QID) | ORAL | Status: DC | PRN

## 2013-04-12 MED ORDER — LORAZEPAM 2 MG/ML IJ SOLN
1.0000 mg | Freq: Once | INTRAMUSCULAR | Status: AC
  Administered 2013-04-12: 1 mg via INTRAVENOUS
  Filled 2013-04-12: qty 1

## 2013-04-12 MED ORDER — LORAZEPAM 1 MG PO TABS
1.0000 mg | ORAL_TABLET | Freq: Once | ORAL | Status: AC
  Administered 2013-04-12: 1 mg via ORAL
  Filled 2013-04-12: qty 2

## 2013-04-12 MED ORDER — NAPROXEN 500 MG PO TABS: 500.0000 mg | ORAL_TABLET | Freq: Two times a day (BID) | ORAL | Status: DC
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
## 2013-04-14 NOTE — ED Provider Notes (Signed)
Medical screening examination/treatment/procedure(s) were performed by non-physician practitioner and as supervising physician I was immediately available for consultation/collaboration.  Ahonesty Woodfin J. Lavera Vandermeer, MD 04/14/13 0829 

## 2013-07-21 ENCOUNTER — Encounter (HOSPITAL_COMMUNITY): Payer: Self-pay | Admitting: Emergency Medicine

## 2013-07-21 ENCOUNTER — Emergency Department (HOSPITAL_COMMUNITY)
Admission: EM | Admit: 2013-07-21 | Discharge: 2013-07-21 | Disposition: A | Discharge status: Home / Self Care | Payer: Self-pay | Attending: Emergency Medicine | Admitting: Emergency Medicine

## 2013-07-21 DIAGNOSIS — Y929 Unspecified place or not applicable: Secondary | ICD-10-CM | POA: Insufficient documentation

## 2013-07-21 DIAGNOSIS — W503XXA Accidental bite by another person, initial encounter: Secondary | ICD-10-CM | POA: Insufficient documentation

## 2013-07-21 DIAGNOSIS — S01502A Unspecified open wound of oral cavity, initial encounter: Secondary | ICD-10-CM | POA: Insufficient documentation

## 2013-07-21 DIAGNOSIS — S01512A Laceration without foreign body of oral cavity, initial encounter: Secondary | ICD-10-CM

## 2013-07-21 DIAGNOSIS — Z79899 Other long term (current) drug therapy: Secondary | ICD-10-CM | POA: Insufficient documentation

## 2013-07-21 DIAGNOSIS — I10 Essential (primary) hypertension: Secondary | ICD-10-CM | POA: Insufficient documentation

## 2013-07-21 DIAGNOSIS — Y9389 Activity, other specified: Secondary | ICD-10-CM | POA: Insufficient documentation

## 2013-07-21 MED ORDER — LIDOCAINE VISCOUS 2 % MT SOLN
15.0000 mL | Freq: Once | OROMUCOSAL | Status: AC
  Administered 2013-07-21: 15 mL via OROMUCOSAL
  Filled 2013-07-21: qty 15

## 2013-07-21 MED ORDER — LIDOCAINE VISCOUS 2 % MT SOLN
15.0000 mL | Freq: Once | OROMUCOSAL | Status: AC
  Administered 2013-07-21: 15 mL via OROMUCOSAL
  Filled 2013-07-21 (×2): qty 15

## 2013-07-21 MED ORDER — HYDROCODONE-ACETAMINOPHEN 5-325 MG PO TABS: 1.0000 | ORAL_TABLET | ORAL | Status: DC | PRN

## 2013-07-21 NOTE — ED Provider Notes (Signed)
CSN: [id number]     Arrival date & time 07/21/13  1358 History  This chart was scribed for Charlann Lange, PA working with Leota Jacobsen, MD by Roxan Diesel, ED Scribe. This patient was seen in room WTR7/WTR7 and the patient's care was started at 2:57 PM.   Chief Complaint  Patient presents with  . Facial Laceration    bit tongue    The history is provided by the patient. No language interpreter was used.    HPI Comments: [name] [name] is a 43 y.o. male who presents to the Emergency Department complaining of a tongue laceration sustained 3 hours ago.  Pt reports that he was eating and accidentally bit down onto his tongue.  He has had constant bleeding from the area since then.  He has been holding gauze to the area since arrival but states his bleeding has not seemed to slow down.  He is not on blood-thinners.  He admits to h/o HTN but denies any other chronic medical conditions or regular medication usage.  He denies medication allergies.    Past Medical History  Diagnosis Date  . Hypertension     History reviewed. No pertinent past surgical history.   Family History  Problem Relation Age of Onset  . Diabetes Other   . Hypertension Other     History  Substance Use Topics  . Smoking status: Never Smoker   . Smokeless tobacco: Not on file  . Alcohol Use: Yes     Review of Systems  Skin: Positive for wound (tongue laceration).  All other systems reviewed and are negative.      Allergies  Review of patient's allergies indicates no known allergies.  Home Medications   Current Outpatient Rx  Name  Route  Sig  Dispense  Refill  . lisinopril-hydrochlorothiazide (PRINZIDE,ZESTORETIC) 20-25 MG per tablet   Oral   Take 1 tablet by mouth daily.          BP 133/95  Pulse 107  Temp(Src) 97.7 F (36.5 C) (Oral)  Resp 18  SpO2 98%  Physical Exam  Nursing note and vitals reviewed. Constitutional: He is oriented to person, place, and time. He appears  well-developed and well-nourished. No distress.  HENT:  Head: Normocephalic.  Less than 1-cm transverse laceration to the top of the tongue.  No inferior tongue laceration.  Active bleeding.  Dentition stable.  Eyes: EOM are normal.  Neck: Neck supple. No tracheal deviation present.  Cardiovascular: Normal rate.   Pulmonary/Chest: Effort normal. No respiratory distress.  Musculoskeletal: Normal range of motion.  Neurological: He is alert and oriented to person, place, and time.  Skin: Skin is warm and dry.  Psychiatric: He has a normal mood and affect. His behavior is normal.    ED Course  Procedures (including critical care time)  DIAGNOSTIC STUDIES: Oxygen Saturation is 98% on room air, normal by my interpretation.    COORDINATION OF CARE: 3:00 PM-Discussed treatment plan with pt at bedside and pt agreed to plan.   Attempt to control bleeding without suture consisted of viscous lidocaine with epi, injected lido w/epi, ice, and pressure. Wound continued to ooze. Three sutures applied as in suture note with significant improvement in bleeding. Discussed care.   Labs Review Labs Reviewed - No data to display  Imaging Review No results found.   EKG Interpretation None      MDM   Final diagnoses:  None    1. Tongue laceration  LACERATION REPAIR Performed by: Phoebe Sharps,  Luisalberto Beegle A Authorized by: Charlann Lange A Consent: Verbal consent obtained. Risks and benefits: risks, benefits and alternatives were discussed Consent given by: patient Patient identity confirmed: provided demographic data Prepped and Draped in normal sterile fashion Wound explored  Laceration Location: tongue  Laceration Length: 1cm  No Foreign Bodies seen or palpated  Anesthesia: local infiltration  Local anesthetic: lidocaine 2% w/ epinephrine  Anesthetic total: 1 ml  Irrigation method: syringe Amount of cleaning: standard  Skin closure: 4-0 chromic  Number of sutures: 3  Technique:  simple interrupted  Patient tolerance: Patient tolerated the procedure well with no immediate complications.    I personally performed the services described in this documentation, which was scribed in my presence. The recorded information has been reviewed and is accurate.     Dewaine Oats, PA-C 07/21/13 1648

## 2013-07-21 NOTE — ED Notes (Signed)
Pt was eating and bit down on his tongue.  C/o tongue bleeding x 3 hours.  Not on thinners.

## 2013-07-21 NOTE — Progress Notes (Signed)
[facility] provided pt with a list of primary care resources to help patient establish primary care.  °

## 2013-07-21 NOTE — Discharge Instructions (Signed)
Absorbable Suture Repair  Absorbable sutures (stitches) hold skin together so you can heal. Keep skin wounds clean and dry for the next 2 to 3 days. Then, you may gently wash your wound and dress it with an antibiotic ointment as recommended. As your wound begins to heal, the sutures are no longer needed, and they typically begin to fall off. This will take 7 to 10 days. After 10 days, if your sutures are loose, you can remove them by wiping with a clean gauze pad or a cotton ball. Do not pull your sutures out. They should wipe away easily. If after 10 days they do not easily wipe away, have your caregiver take them out. Absorbable sutures may be used deep in a wound to help hold it together. If these stitches are below the skin, the body will absorb them completely in 3 to 4 weeks.   You may need a tetanus shot if:  · You cannot remember when you had your last tetanus shot.  · You have never had a tetanus shot.  If you get a tetanus shot, your arm may swell, get red, and feel warm to the touch. This is common and not a problem. If you need a tetanus shot and you choose not to have one, there is a rare chance of getting tetanus. Sickness from tetanus can be serious.  SEEK IMMEDIATE MEDICAL CARE IF:  · You have redness in the wound area.  · The wound area feels hot to the touch.  · You develop swelling in the wound area.  · You develop pain.  · There is fluid drainage from the wound.  Document Released: 06/14/2004 Document Revised: 07/30/2011 Document Reviewed: 09/26/2010  [employer]® Patient Information ©2014 [employer].

## 2013-07-22 ENCOUNTER — Encounter (HOSPITAL_COMMUNITY): Payer: Self-pay | Admitting: Emergency Medicine

## 2013-07-22 ENCOUNTER — Emergency Department (HOSPITAL_COMMUNITY)
Admission: EM | Admit: 2013-07-22 | Discharge: 2013-07-22 | Disposition: A | Discharge status: Home / Self Care | Payer: No Typology Code available for payment source | Attending: Emergency Medicine | Admitting: Emergency Medicine

## 2013-07-22 DIAGNOSIS — S01512A Laceration without foreign body of oral cavity, initial encounter: Secondary | ICD-10-CM

## 2013-07-22 DIAGNOSIS — Z79899 Other long term (current) drug therapy: Secondary | ICD-10-CM | POA: Insufficient documentation

## 2013-07-22 DIAGNOSIS — W503XXA Accidental bite by another person, initial encounter: Secondary | ICD-10-CM | POA: Insufficient documentation

## 2013-07-22 DIAGNOSIS — Y9389 Activity, other specified: Secondary | ICD-10-CM | POA: Insufficient documentation

## 2013-07-22 DIAGNOSIS — S01502A Unspecified open wound of oral cavity, initial encounter: Secondary | ICD-10-CM | POA: Insufficient documentation

## 2013-07-22 DIAGNOSIS — Y929 Unspecified place or not applicable: Secondary | ICD-10-CM | POA: Insufficient documentation

## 2013-07-22 DIAGNOSIS — I10 Essential (primary) hypertension: Secondary | ICD-10-CM | POA: Insufficient documentation

## 2013-07-22 NOTE — Discharge Instructions (Signed)
Absorbable Suture Repair  Absorbable sutures (stitches) hold skin together so you can heal. Keep skin wounds clean and dry for the next 2 to 3 days. Then, you may gently wash your wound and dress it with an antibiotic ointment as recommended. As your wound begins to heal, the sutures are no longer needed, and they typically begin to fall off. This will take 7 to 10 days. After 10 days, if your sutures are loose, you can remove them by wiping with a clean gauze pad or a cotton ball. Do not pull your sutures out. They should wipe away easily. If after 10 days they do not easily wipe away, have your caregiver take them out. Absorbable sutures may be used deep in a wound to help hold it together. If these stitches are below the skin, the body will absorb them completely in 3 to 4 weeks.   You may need a tetanus shot if:  · You cannot remember when you had your last tetanus shot.  · You have never had a tetanus shot.  If you get a tetanus shot, your arm may swell, get red, and feel warm to the touch. This is common and not a problem. If you need a tetanus shot and you choose not to have one, there is a rare chance of getting tetanus. Sickness from tetanus can be serious.  SEEK IMMEDIATE MEDICAL CARE IF:  · You have redness in the wound area.  · The wound area feels hot to the touch.  · You develop swelling in the wound area.  · You develop pain.  · There is fluid drainage from the wound.  Document Released: 06/14/2004 Document Revised: 07/30/2011 Document Reviewed: 09/26/2010  [employer]® Patient Information ©2014 [employer].

## 2013-07-22 NOTE — ED Provider Notes (Addendum)
CSN: [id number]     Arrival date & time 07/22/13  4132 History   First MD Initiated Contact with Patient 07/22/13 [phone]     Chief Complaint  Patient presents with  . tongue bleeding      (Consider location/radiation/quality/duration/timing/severity/associated sxs/prior Treatment) Patient is a 43 y.o. male presenting with mouth injury. The history is provided by the patient.  Mouth Injury This is a new problem. Episode onset: seen here last night for tongue laceration after he bit it.  however the wound has continued to bleed all night. The problem occurs constantly. The problem has not changed since onset.Associated symptoms comments: Persistent bleeding in tongue despite stitches.. The symptoms are aggravated by eating. Nothing relieves the symptoms. He has tried a cold compress for the symptoms. The treatment provided no relief.    Past Medical History  Diagnosis Date  . Hypertension    History reviewed. No pertinent past surgical history. Family History  Problem Relation Age of Onset  . Diabetes Other   . Hypertension Other    History  Substance Use Topics  . Smoking status: Never Smoker   . Smokeless tobacco: Not on file  . Alcohol Use: Yes    Review of Systems  All other systems reviewed and are negative.      Allergies  Review of patient's allergies indicates no known allergies.  Home Medications   Current Outpatient Rx  Name  Route  Sig  Dispense  Refill  . HYDROcodone-acetaminophen (NORCO/VICODIN) 5-325 MG per tablet   Oral   Take 1-2 tablets by mouth every 4 (four) hours as needed.   12 tablet   0   . lisinopril-hydrochlorothiazide (PRINZIDE,ZESTORETIC) 20-25 MG per tablet   Oral   Take 1 tablet by mouth daily.          BP 153/113  Pulse 109  Temp(Src) 98.9 F (37.2 C) (Axillary)  Resp 20  Ht 6\' 5"  (1.956 m)  Wt 190 lb (86.183 kg)  BMI 22.53 kg/m2  SpO2 98% Physical Exam  Nursing note and vitals reviewed. Constitutional: He is oriented to  person, place, and time. He appears well-developed and well-nourished. No distress.  HENT:  Mouth/Throat:    Eyes: EOM are normal. Pupils are equal, round, and reactive to light.  Cardiovascular: Normal rate.   Pulmonary/Chest: Effort normal.  Neurological: He is alert and oriented to person, place, and time.  Skin: Skin is warm and dry.  Psychiatric: He has a normal mood and affect. His behavior is normal.    ED Course  Procedures (including critical care time) Labs Review Labs Reviewed - No data to display Imaging Review No results found.   EKG Interpretation None     LACERATION REPAIR Performed by: Blanchie Dessert Authorized byBlanchie Dessert Consent: Verbal consent obtained. Risks and benefits: risks, benefits and alternatives were discussed Consent given by: patient Patient identity confirmed: provided demographic data Prepped and Draped in normal sterile fashion Wound explored  Laceration Location: tongue  Laceration Length: 3cm  No Foreign Bodies seen or palpated  Anesthesia: local infiltration  Local anesthetic: lidocaine 2% with epinephrine  Anesthetic total: 1 ml  Irrigation method: syringe Amount of cleaning: standard  Skin closure: 5.0 Vicryl   Number of sutures: 2   Technique: Simple interrupted   Patient tolerance: Patient tolerated the procedure well with no immediate complications.  MDM   Final diagnoses:  Laceration of tongue with complication    Patient returning do to persistent bleeding from the laceration he sustained in his  tongue last night. The laceration was sutured however one of his sutures broke and he's had persistent bleeding since.  Patient is not taking anticoagulation and has persistent bleeding from the lateral edge of his laceration. Area was anesthetized and 2 more sutures placed with resolution of the bleeding. Patient was monitored for 30 minutes to ensure no further bleeding.    Blanchie Dessert,  MD 07/22/13 7782  Blanchie Dessert, MD 07/22/13 4235

## 2013-07-22 NOTE — ED Notes (Signed)
Pt states had stitches placed in his tongue yesterday for injury from bite from chewing; states has been bleeding all night; spitting blood into emesis bag in triage

## 2013-07-22 NOTE — ED Notes (Signed)
Vitals elevated at d/c.  [place] made aware and ok to send home.

## 2013-07-22 NOTE — ED Provider Notes (Signed)
Medical screening examination/treatment/procedure(s) were performed by non-physician practitioner and as supervising physician I was immediately available for consultation/collaboration.   EKG Interpretation None        Ephraim Hamburger, MD 07/22/13 1200

## 2013-10-11 ENCOUNTER — Encounter (HOSPITAL_COMMUNITY): Payer: Self-pay | Admitting: Emergency Medicine

## 2013-10-11 ENCOUNTER — Emergency Department (HOSPITAL_COMMUNITY)
Admission: EM | Admit: 2013-10-11 | Discharge: 2013-10-11 | Disposition: A | Discharge status: Home / Self Care | Payer: Self-pay | Attending: Emergency Medicine | Admitting: Emergency Medicine

## 2013-10-11 DIAGNOSIS — M549 Dorsalgia, unspecified: Secondary | ICD-10-CM

## 2013-10-11 DIAGNOSIS — M545 Low back pain, unspecified: Secondary | ICD-10-CM | POA: Insufficient documentation

## 2013-10-11 DIAGNOSIS — I10 Essential (primary) hypertension: Secondary | ICD-10-CM | POA: Insufficient documentation

## 2013-10-11 DIAGNOSIS — Z79899 Other long term (current) drug therapy: Secondary | ICD-10-CM | POA: Insufficient documentation

## 2013-10-11 LAB — URINALYSIS, ROUTINE W REFLEX MICROSCOPIC
Bilirubin Urine: NEGATIVE
GLUCOSE, UA: NEGATIVE mg/dL
Hgb urine dipstick: NEGATIVE
KETONES UR: NEGATIVE mg/dL
Leukocytes, UA: NEGATIVE
Nitrite: NEGATIVE
PROTEIN: NEGATIVE mg/dL
Specific Gravity, Urine: 1.017 (ref 1.005–1.030)
Urobilinogen, UA: 0.2 mg/dL (ref 0.0–1.0)
pH: 5 (ref 5.0–8.0)

## 2013-10-11 MED ORDER — HYDROCODONE-ACETAMINOPHEN 5-325 MG PO TABS
2.0000 | ORAL_TABLET | Freq: Once | ORAL | Status: AC
  Administered 2013-10-11: 2 via ORAL
  Filled 2013-10-11: qty 2

## 2013-10-11 MED ORDER — IBUPROFEN 800 MG PO TABS
800.0000 mg | ORAL_TABLET | Freq: Once | ORAL | Status: AC
  Administered 2013-10-11: 800 mg via ORAL
  Filled 2013-10-11: qty 1

## 2013-10-11 MED ORDER — HYDROCODONE-ACETAMINOPHEN 5-325 MG PO TABS: 1.0000 | ORAL_TABLET | Freq: Four times a day (QID) | ORAL | Status: DC | PRN

## 2013-10-11 MED ORDER — IBUPROFEN 800 MG PO TABS: 800.0000 mg | ORAL_TABLET | Freq: Three times a day (TID) | ORAL | Status: DC

## 2013-10-11 NOTE — ED Notes (Signed)
Pt states he has had right lower back pain x3 days, worsening today.  Pt rates back pain 10/10 currently and states it is unchanged regardless of position.  Pt denies trauma or injury to back.  Pt denies previous episodes of back pain.  Pt states he noticed the back pain @ 3 days ago when he woke up.  Pt denies urinary frequency/urgency or hematuria.  Pt denies numbness/tingling or loss of bowel or bladder control.  Pt has not treated pain at home.

## 2013-10-11 NOTE — ED Provider Notes (Signed)
CSN: [id number]     Arrival date & time 10/11/13  1239 History  This chart was scribed for non-physician practitioner Glendell Docker, NP working with Houston Siren III, by Mercy Moore, ED Scribe. This patient was seen in room WTR5/WTR5 and the patient's care was started at 1:05 PM.   Chief Complaint  Patient presents with  . Back Pain     The history is provided by the patient. No language interpreter was used.   HPI Comments: [name] [name] is a 43 y.o. male who presents to the Emergency Department complaining of right lower back pain, onset upon wakening three days ago. Patient states that initially the pain was intermittent but is now constant and much worse in intensity. Patient rates pain at 10/10. Patient denies alleviating factors. Patient has not taken any pain medication. Patient denies dysuria, numbness or weakness.   Patient denies history of back pain. Patient is unemployed and denies recent injury or causative trauma.   Past Medical History  Diagnosis Date  . Hypertension    History reviewed. No pertinent past surgical history. Family History  Problem Relation Age of Onset  . Diabetes Other   . Hypertension Other    History  Substance Use Topics  . Smoking status: Never Smoker   . Smokeless tobacco: Never Used  . Alcohol Use: Yes    Review of Systems  Genitourinary: Negative for urgency, frequency and hematuria.  Neurological: Negative for weakness and numbness.  All other systems reviewed and are negative.    Allergies  Review of patient's allergies indicates no known allergies.  Home Medications   Prior to Admission medications   Medication Sig Start Date End Date Taking? Authorizing Provider  HYDROcodone-acetaminophen (NORCO/VICODIN) 5-325 MG per tablet Take 1-2 tablets by mouth every 4 (four) hours as needed. 07/21/13   Shari A Upstill, PA-C  lisinopril-hydrochlorothiazide (PRINZIDE,ZESTORETIC) 20-25 MG per tablet Take 1 tablet by mouth daily.     Historical Provider, MD   Triage Vitals: BP 142/96  Pulse 101  Temp(Src) 98.1 F (36.7 C) (Oral)  Resp 17  SpO2 98% Physical Exam  Nursing note and vitals reviewed. Constitutional: He is oriented to person, place, and time. He appears well-developed and well-nourished.  HENT:  Head: Normocephalic and atraumatic.  Eyes: EOM are normal.  Neck: Neck supple. No tracheal deviation present.  Cardiovascular: Normal rate.   Pulmonary/Chest: Effort normal. No respiratory distress.  Musculoskeletal: He exhibits tenderness.  Right lumbar paraspinal tenderness. Moves all extemities without any problems  Neurological: He is alert and oriented to person, place, and time. He exhibits normal muscle tone. Coordination normal.  Skin: Skin is warm and dry.  Psychiatric: He has a normal mood and affect. His behavior is normal.    ED Course  Procedures (including critical care time) DIAGNOSTIC STUDIES: Oxygen Saturation is 98% on room air, normal by my interpretation.    COORDINATION OF CARE: 1:10 PM- Discussed treatment plan with patient at bedside and patient agreed to plan.     Labs Review Labs Reviewed  URINALYSIS, ROUTINE W REFLEX MICROSCOPIC    Imaging Review No results found.   EKG Interpretation None      MDM   Final diagnoses:  Back pain    Not having any neuro deficits:will treat symptomatically:no redflags  I personally performed the services described in this documentation, which was scribed in my presence. The recorded information has been reviewed and is accurate.   Glendell Docker, NP 10/11/13 [phone]

## 2013-10-11 NOTE — Discharge Instructions (Signed)
Back Pain, Adult  Back pain is very common. The pain often gets better over time. The cause of back pain is usually not dangerous. Most people can learn to manage their back pain on their own.   HOME CARE   · Stay active. Start with short walks on flat ground if you can. Try to walk farther each day.  · Do not sit, drive, or stand in one place for more than 30 minutes. Do not stay in bed.  · Do not avoid exercise or work. Activity can help your back heal faster.  · Be careful when you bend or lift an object. Bend at your knees, keep the object close to you, and do not twist.  · Sleep on a firm mattress. Lie on your side, and bend your knees. If you lie on your back, put a pillow under your knees.  · Only take medicines as told by your doctor.  · Put ice on the injured area.  · Put ice in a plastic bag.  · Place a towel between your skin and the bag.  · Leave the ice on for 15-20 minutes, 03-04 times a day for the first 2 to 3 days. After that, you can switch between ice and heat packs.  · Ask your doctor about back exercises or massage.  · Avoid feeling anxious or stressed. Find good ways to deal with stress, such as exercise.  GET HELP RIGHT AWAY IF:   · Your pain does not go away with rest or medicine.  · Your pain does not go away in 1 week.  · You have new problems.  · You do not feel well.  · The pain spreads into your legs.  · You cannot control when you poop (bowel movement) or pee (urinate).  · Your arms or legs feel weak or lose feeling (numbness).  · You feel sick to your stomach (nauseous) or throw up (vomit).  · You have belly (abdominal) pain.  · You feel like you may pass out (faint).  MAKE SURE YOU:   · Understand these instructions.  · Will watch your condition.  · Will get help right away if you are not doing well or get worse.  Document Released: 10/24/2007 Document Revised: 07/30/2011 Document Reviewed: 09/25/2010  [employer]® Patient Information ©2014 [employer].

## 2013-10-11 NOTE — ED Provider Notes (Signed)
Medical screening examination/treatment/procedure(s) were performed by non-physician practitioner and as supervising physician I was immediately available for consultation/collaboration.   EKG Interpretation None        Houston Siren III, MD 10/11/13 [phone]

## 2013-10-11 NOTE — ED Notes (Signed)
Pt denies blood in stool and diarrhea as well as N/V.

## 2014-01-09 ENCOUNTER — Encounter: Payer: Self-pay | Admitting: Gastroenterology

## 2014-08-12 ENCOUNTER — Encounter (HOSPITAL_COMMUNITY): Payer: Self-pay

## 2014-08-12 ENCOUNTER — Ambulatory Visit (HOSPITAL_COMMUNITY)
Admission: RE | Admit: 2014-08-12 | Discharge: 2014-08-12 | Disposition: A | Discharge status: Home / Self Care | Payer: Self-pay | Attending: Psychiatry | Admitting: Psychiatry

## 2014-08-12 ENCOUNTER — Emergency Department (HOSPITAL_COMMUNITY)
Admission: EM | Admit: 2014-08-12 | Discharge: 2014-08-14 | Disposition: A | Discharge status: Other Acute Inpatient Hospital | Payer: Self-pay | Attending: Emergency Medicine | Admitting: Emergency Medicine

## 2014-08-12 DIAGNOSIS — Z791 Long term (current) use of non-steroidal anti-inflammatories (NSAID): Secondary | ICD-10-CM | POA: Insufficient documentation

## 2014-08-12 DIAGNOSIS — Z7952 Long term (current) use of systemic steroids: Secondary | ICD-10-CM | POA: Insufficient documentation

## 2014-08-12 DIAGNOSIS — F332 Major depressive disorder, recurrent severe without psychotic features: Secondary | ICD-10-CM | POA: Insufficient documentation

## 2014-08-12 DIAGNOSIS — F1210 Cannabis abuse, uncomplicated: Secondary | ICD-10-CM | POA: Insufficient documentation

## 2014-08-12 DIAGNOSIS — I10 Essential (primary) hypertension: Secondary | ICD-10-CM | POA: Insufficient documentation

## 2014-08-12 DIAGNOSIS — F1410 Cocaine abuse, uncomplicated: Secondary | ICD-10-CM | POA: Insufficient documentation

## 2014-08-12 DIAGNOSIS — R45851 Suicidal ideations: Secondary | ICD-10-CM

## 2014-08-12 LAB — COMPREHENSIVE METABOLIC PANEL
ALT: 50 U/L (ref 0–53)
AST: 105 U/L — ABNORMAL HIGH (ref 0–37)
Albumin: 4.5 g/dL (ref 3.5–5.2)
Alkaline Phosphatase: 74 U/L (ref 39–117)
Anion gap: 13 (ref 5–15)
BUN: 16 mg/dL (ref 6–23)
CO2: 23 mmol/L (ref 19–32)
Calcium: 9.2 mg/dL (ref 8.4–10.5)
Chloride: 103 mmol/L (ref 96–112)
Creatinine, Ser: 0.82 mg/dL (ref 0.50–1.35)
GFR calc Af Amer: >90 mL/min (ref >90)
GFR calc non Af Amer: >90 mL/min (ref >90)
Glucose, Bld: 93 mg/dL (ref 70–99)
Potassium: 3.9 mmol/L (ref 3.5–5.1)
Sodium: 139 mmol/L (ref 135–145)
Total Bilirubin: 0.9 mg/dL (ref 0.3–1.2)
Total Protein: 8.3 g/dL (ref 6.0–8.3)

## 2014-08-12 LAB — RAPID URINE DRUG SCREEN, HOSP PERFORMED
Amphetamines: NOT DETECTED
Barbiturates: NOT DETECTED
Benzodiazepines: NOT DETECTED
Cocaine: POSITIVE — AB
Opiates: NOT DETECTED
Tetrahydrocannabinol: POSITIVE — AB

## 2014-08-12 LAB — CBC
HCT: 44.7 % (ref 39.0–52.0)
Hemoglobin: 15.4 g/dL (ref 13.0–17.0)
MCH: 34.1 pg — ABNORMAL HIGH (ref 26.0–34.0)
MCHC: 34.5 g/dL (ref 30.0–36.0)
MCV: 99.1 fL (ref 78.0–100.0)
Platelets: 238 10*3/uL (ref 150–400)
RBC: 4.51 MIL/uL (ref 4.22–5.81)
RDW: 12.1 % (ref 11.5–15.5)
WBC: 4.6 10*3/uL (ref 4.0–10.5)

## 2014-08-12 LAB — SALICYLATE LEVEL: Salicylate Lvl: <4 mg/dL (ref 2.8–20.0)

## 2014-08-12 LAB — ACETAMINOPHEN LEVEL: Acetaminophen (Tylenol), Serum: <10 ug/mL — ABNORMAL LOW (ref 10–30)

## 2014-08-12 LAB — ETHANOL: Alcohol, Ethyl (B): 263 mg/dL — ABNORMAL HIGH (ref 0–9)

## 2014-08-12 MED ORDER — LORAZEPAM 0.5 MG PO TABS
0.5000 mg | ORAL_TABLET | Freq: Once | ORAL | Status: AC
  Administered 2014-08-12: 0.5 mg via ORAL
  Filled 2014-08-12: qty 1

## 2014-08-12 MED ORDER — ACETAMINOPHEN 325 MG PO TABS: 650.0000 mg | ORAL_TABLET | ORAL | Status: DC | PRN

## 2014-08-12 MED ORDER — ONDANSETRON HCL 4 MG PO TABS: 4.0000 mg | ORAL_TABLET | Freq: Three times a day (TID) | ORAL | Status: DC | PRN

## 2014-08-12 MED ORDER — ZOLPIDEM TARTRATE 5 MG PO TABS: 5.0000 mg | ORAL_TABLET | Freq: Every evening | ORAL | Status: DC | PRN

## 2014-08-12 MED ORDER — LORAZEPAM 1 MG PO TABS
1.0000 mg | ORAL_TABLET | Freq: Three times a day (TID) | ORAL | Status: DC | PRN
  Administered 2014-08-13: 1 mg via ORAL
  Filled 2014-08-12: qty 1

## 2014-08-12 MED ORDER — IBUPROFEN 200 MG PO TABS: 600.0000 mg | ORAL_TABLET | Freq: Three times a day (TID) | ORAL | Status: DC | PRN

## 2014-08-12 MED ORDER — ALUM & MAG HYDROXIDE-SIMETH 200-200-20 MG/5ML PO SUSP: 30.0000 mL | ORAL | Status: DC | PRN

## 2014-08-12 MED ORDER — IBUPROFEN 200 MG PO TABS
600.0000 mg | ORAL_TABLET | Freq: Once | ORAL | Status: AC
  Administered 2014-08-12: 600 mg via ORAL
  Filled 2014-08-12: qty 3

## 2014-08-12 NOTE — BH Assessment (Signed)
Tele Assessment Note   [name] [name] is a 44 y.o., single, African-American male who presents as a walk-in to [facility] after seeing his primary care physician, whom told him that he needed to talk with someone about his depression. Pt presents with depressed mood and congruent affect, becoming tearful throughout assessment at times. He has difficulty articulating at times and says he does not know how to open up to people well or explain how he is feeling. Eye-contact is fair and pt often looks at the floor. Thought process is not always linear and pt exhibits thought-blocking. Speech is usually coherent. Pt has freedom of movement but c/o some knee pain. Pt is well-oriented. Pt c/o SI and HI with plans but no intent. He reports increasing sx of depression recently but states that he has been struggling with depression for many years. He c/o insomnia, racing thoughts, decreased appetite with weight loss2, fatigue, feelings of guilt, anhedonia, tearfulness, anger outbursts and increased irritability, social isolation, HI, and SI with plan to shoot self. Pt does have access to a gun at his home he shares with his mother. Pt also reports HI but is very vague about his plans and intention, laughing and stating, "Yeah, those [HI] are worse than the suicidal thoughts". Pt does have a hx of harm towards others but says that he has never seriously injured anyone. Pt endorses some legal px and has a court date in April. Pt says he is unsure of what triggered his current depressive episode but that stress associated with being his mother's primary caregiver could be contributing, as she is reported to have Alzheimer's. 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 Pt says his family is very supportive but that he does not like to burden them with his problems, and is also unsure of whom he can trust with his problems.  Pt  says he drinks a 2-3 beers about 3-4x per week but he denies any hx of SA. No prior psychiatric hospitalizations and no prior mental health tx of any kind reported. Pt denies being on any psych meds. He does have knee and shoulder problems/pain and sometimes has difficulty walking and climbing stairs. He does report that his knee will sometimes "go out" on him, causing him to fall. Pt does not use any assistive devices. Pt denies A/VH. He denies any hx of suicide attempt. Pt is willing to go to inpt tx voluntarily, if recommended.   Per Arlester Marker, NP, psych eval in the AM is recommended. Pt sent to [facility] for med clearance.  Axis I: 296.30 Major depressive disorder, Recurrent episode, Unspecified Axis II: No diagnosis Axis III:  Past Medical History  Diagnosis Date  . Hypertension    Axis IV: economic problems, housing problems, occupational problems, other psychosocial or environmental problems, problems related to legal system/crime, problems related to social environment, problems with access to health care services and problems with primary support group Axis V: 41-50 serious symptoms  Past Medical History:  Past Medical History  Diagnosis Date  . Hypertension     No past surgical history on file.  Family History:  Family History  Problem Relation Age of Onset  . Diabetes Other   . Hypertension Other     Social History:  reports that he has never smoked. He has never used smokeless tobacco. He reports that he drinks alcohol. He reports  that he does not use illicit drugs.  Additional Social History:  Alcohol / Drug Use Pain Medications: See PTA List Prescriptions: See PTA List Over the Counter: See PTA List History of alcohol / drug use?: Yes Longest period of sobriety (when/how long): Pt denies abuse of etoh Negative Consequences of Use:  (Denies) Withdrawal Symptoms:  (Denies) Substance #1 Name of Substance 1: Etoh 1 - Age of First Use: 19 1 - Amount (size/oz): 2-3  beers 1 - Frequency: 3-4x per week 1 - Duration: several years 1 - Last Use / Amount: Yesterday  CIWA:   COWS:    PATIENT STRENGTHS: (choose at least two) Ability for insight Average or above average intelligence Capable of independent living Communication skills General fund of knowledge Supportive family/friends  Allergies: No Known Allergies  Home Medications:  (Not in a hospital admission)  OB/GYN Status:  No LMP for male patient.  General Assessment Data Location of Assessment: [facility] Is this a Tele or Face-to-Face Assessment?: Face-to-Face Is this an Initial Assessment or a Re-assessment for this encounter?: Initial Assessment Living Arrangements: Parent Can pt return to current living arrangement?: Yes Admission Status: Voluntary Is patient capable of signing voluntary admission?: Yes Transfer from: Other (Comment) (MD's office) Referral Source: MD  Medical Screening Exam ([place]) Medical Exam completed: Yes  [place] Living Arrangements: Parent Name of Psychiatrist: None Name of Therapist: None  Education Status Is patient currently in school?: No Current Grade: na Highest grade of school patient has completed: na Name of school: na Contact person: na  Risk to self with the past 6 months Suicidal Ideation: Yes-Currently Present Suicidal Intent: No Is patient at risk for suicide?: Yes Suicidal Plan?: Yes-Currently Present Specify Current Suicidal Plan: Shoot self Access to Means: Yes Specify Access to Suicidal Means: Access to firearms at home What has been your use of drugs/alcohol within the last 12 months?: Etoh use w/in last 3-4 weeks Previous Attempts/Gestures: No How many times?: 0 Other Self Harm Risks: None Intentional Self Injurious Behavior: None Family Suicide History: No Recent stressful life event(s): Financial Problems, Legal Issues, Other (Comment) (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) Persecutory voices/beliefs?: No Depression: Yes Depression Symptoms: Despondent, Insomnia, Tearfulness, Isolating, Fatigue, Guilt, Loss of interest in usual pleasures, Feeling angry/irritable Substance abuse history and/or treatment for substance abuse?: No Suicide prevention information given to non-admitted patients: Not applicable  Risk to Others within the past 6 months Homicidal Ideation: Yes-Currently Present Thoughts of Harm to Others: Yes-Currently Present Comment - Thoughts of Harm to Others: Pt did not elaborate beyond stating that he is easily irritated and had hurt people in the past (though not "seriously") Current Homicidal Intent: No Current Homicidal Plan: No Access to Homicidal Means: Yes Describe Access to Homicidal Means: Access to firearms at home Identified Victim: None reported History of harm to others?: Yes Assessment of Violence: In past 6-12 months Violent Behavior Description: Pt reports getting into altercations with people who irritate him or argue with him Does patient have access to weapons?: Yes (Comment) (Firearms) Criminal Charges Pending?: No Does patient have a court date: Yes Court Date: 08/20/14  Psychosis Hallucinations: None noted Delusions: None noted  Mental Status Report Appearance/Hygiene: Disheveled Eye Contact: Fair Motor Activity: Freedom of movement Speech: Slow Level of Consciousness: Quiet/awake Mood: Depressed Affect: Depressed Anxiety Level: Minimal Thought Processes: Coherent, Thought Blocking Judgement: Partial Orientation: Person, Place, Time, Situation Obsessive Compulsive Thoughts/Behaviors: None  Cognitive Functioning Concentration: Decreased Memory: Recent Intact, Remote  Intact IQ: Average Insight: Fair Impulse Control: Fair Appetite: Fair Weight Loss: 15 Weight Gain: 0 Sleep: Decreased Total Hours of Sleep: 2 Vegetative Symptoms: None  ADLScreening [facility] Assessment Services) Patient's  cognitive ability adequate to safely complete daily activities?: Yes Patient able to express need for assistance with ADLs?: Yes Independently performs ADLs?: Yes (appropriate for developmental age)  Prior Inpatient Therapy Prior Inpatient Therapy: No  Prior Outpatient Therapy Prior Outpatient Therapy: No  ADL Screening (condition at time of admission) Patient's cognitive ability adequate to safely complete daily activities?: Yes Is the patient deaf or have difficulty hearing?: No Does the patient have difficulty seeing, even when wearing glasses/contacts?: No Does the patient have difficulty concentrating, remembering, or making decisions?: Yes Patient able to express need for assistance with ADLs?: Yes Does the patient have difficulty dressing or bathing?: No Independently performs ADLs?: Yes (appropriate for developmental age) Does the patient have difficulty walking or climbing stairs?: Yes (Knee and shoulder problems/pain; No hx of surgeries) Weakness of Legs: Left Weakness of Arms/Hands: None  Home Assistive Devices/Equipment Home Assistive Devices/Equipment: None    Abuse/Neglect Assessment (Assessment to be complete while patient is alone) Physical Abuse: Denies Verbal Abuse: Denies Sexual Abuse: Denies Exploitation of patient/patient's resources: Denies Self-Neglect: Denies Values / Beliefs Cultural Requests During Hospitalization: None Spiritual Requests During Hospitalization: None   Advance Directives (For Healthcare) Does patient have an advance directive?: No Would patient like information on creating an advanced directive?: No - patient declined information    Additional Information 1:1 In Past 12 Months?: No CIRT Risk: No Elopement Risk: No Does patient have medical clearance?: No     Disposition: Per Arlester Marker, NP, psych eval in the AM is recommended. Pt sent to [facility] for med clearance. Disposition Initial Assessment Completed for this Encounter:  Yes Disposition of Patient: Other dispositions Other disposition(s): Other (Comment) (AM Psych Eval recommended.)  Ramond Dial, [facility] Triage Specialist  08/12/2014 8:51 PM

## 2014-08-12 NOTE — [facility] Counselor (Signed)
Pt presents as a walk-in to [facility] with c/o worsening depression with SI and HI.   Assessment completed at [facility] and pt was sent to [facility] for med clearance. Agricultural consultant at [employer] made aware. Pt reported he preferred for his brother to take him across the street to [facility].  Per Arlester Marker, NP, psych eval in the AM is recommended.   Ramond Dial, [facility] Triage Specialist

## 2014-08-12 NOTE — ED Provider Notes (Signed)
CSN: [id number]     Arrival date & time 08/12/14  2042 History   First MD Initiated Contact with Patient 08/12/14 2149     Chief Complaint  Patient presents with  . Medical Clearance     (Consider location/radiation/quality/duration/timing/severity/associated sxs/prior Treatment) HPI  [name] [name] is a 44 y.o. male with PMH of HTN presenting for medical clearance. Patient went to behavioral health today due to thoughts of wanting to harm himself or harm others. Patient with history of depression and states he has a gun in the home and he is not using it. He endorses some alcohol and marijuana use but denies any IV or other illicit drug use. Denies any hallucinations. Patient states he has not taken anything for his nerves. He denies any chest pain, shortness of breath, back pain, abdominal pain, nausea, vomiting, headaches.  Past Medical History  Diagnosis Date  . Hypertension    History reviewed. No pertinent past surgical history. Family History  Problem Relation Age of Onset  . Diabetes Other   . Hypertension Other    History  Substance Use Topics  . Smoking status: Never Smoker   . Smokeless tobacco: Never Used  . Alcohol Use: Yes    Review of Systems  Constitutional: Negative for fever and chills.  HENT: Negative for congestion and rhinorrhea.   Eyes: Negative for visual disturbance.  Respiratory: Negative for cough and shortness of breath.   Cardiovascular: Negative for chest pain and palpitations.  Gastrointestinal: Negative for nausea, vomiting and diarrhea.  Musculoskeletal: Negative for back pain and gait problem.  Neurological: Negative for weakness and headaches.      Allergies  Review of patient's allergies indicates no known allergies.  Home Medications   Prior to Admission medications   Medication Sig Start Date End Date Taking? Authorizing Provider  HYDROcodone-acetaminophen (NORCO/VICODIN) 5-325 MG per tablet Take 1-2 tablets by mouth every 6  (six) hours as needed. 10/11/13  Yes Glendell Docker, NP  naproxen (NAPROSYN) 500 MG tablet Take 500 mg by mouth 2 (two) times daily as needed for moderate pain (inflammation).   Yes Historical Provider, MD  predniSONE (DELTASONE) 10 MG tablet Take 10 mg by mouth daily with breakfast.   Yes Historical Provider, MD  HYDROcodone-acetaminophen (NORCO/VICODIN) 5-325 MG per tablet Take 1-2 tablets by mouth every 4 (four) hours as needed. Patient not taking: Reported on 08/12/2014 07/21/13   Charlann Lange, PA-C  ibuprofen (ADVIL,MOTRIN) 800 MG tablet Take 1 tablet (800 mg total) by mouth 3 (three) times daily. Patient not taking: Reported on 08/12/2014 10/11/13   Glendell Docker, NP   BP 144/88 mmHg  Pulse 103  Temp(Src) 98.3 F (36.8 C) (Oral)  Resp 16  Ht 6\' 5"  (1.956 m)  Wt 185 lb (83.915 kg)  BMI 21.93 kg/m2  SpO2 100% Physical Exam  Constitutional: He appears well-developed and well-nourished. No distress.  HENT:  Head: Normocephalic and atraumatic.  Eyes: Conjunctivae and EOM are normal. Right eye exhibits no discharge. Left eye exhibits no discharge.  Cardiovascular: Normal rate and regular rhythm.   Pulmonary/Chest: Effort normal and breath sounds normal. No respiratory distress. He has no wheezes.  Abdominal: Soft. Bowel sounds are normal. He exhibits no distension. There is no tenderness.  Neurological: He is alert. He exhibits normal muscle tone. Coordination normal.  Speech fluent and goal oriented. Patient moves all four extremities without any focal neurological deficits and has no facial droop. Normal gait.  Skin: Skin is warm and dry. He is not diaphoretic.  Nursing note and vitals reviewed.   ED Course  Procedures (including critical care time) Labs Review Labs Reviewed  ACETAMINOPHEN LEVEL - Abnormal; Notable for the following:    Acetaminophen (Tylenol), Serum <10.0 (*)    All other components within normal limits  CBC - Abnormal; Notable for the following:    MCH 34.1  (*)    All other components within normal limits  COMPREHENSIVE METABOLIC PANEL - Abnormal; Notable for the following:    AST 105 (*)    All other components within normal limits  ETHANOL - Abnormal; Notable for the following:    Alcohol, Ethyl (B) 263 (*)    All other components within normal limits  SALICYLATE LEVEL  URINE RAPID DRUG SCREEN (HOSP PERFORMED)    Imaging Review No results found.   EKG Interpretation None      MDM   Final diagnoses:  Suicidal ideation    patient awaiting medical clearance with plan for psychiatric evaluation in the morning. Patient was started and evaluated by behavioral health. Labs significant for alcohol and elevated AST. Pt medically cleared. He has no complaints at this time. Pt to present to [facility] for recheck of AST and further workup.  Discussed return precautions with patient. Discussed all results and patient verbalizes understanding and agrees with plan.     Al Corpus, PA-C 08/12/14 2244  Virgel Manifold, MD 08/14/14 [phone]

## 2014-08-12 NOTE — ED Notes (Addendum)
Patient is experiencing depression.  He went to check himself into [facility], but was told he needed medical clearance first.  Admits to SI/HI.

## 2014-08-13 DIAGNOSIS — R45851 Suicidal ideations: Secondary | ICD-10-CM

## 2014-08-13 DIAGNOSIS — F332 Major depressive disorder, recurrent severe without psychotic features: Secondary | ICD-10-CM | POA: Diagnosis present

## 2014-08-13 MED ORDER — THIAMINE HCL 100 MG/ML IJ SOLN: 100.0000 mg | Freq: Every day | INTRAMUSCULAR | Status: DC

## 2014-08-13 MED ORDER — LORAZEPAM 1 MG PO TABS
1.0000 mg | ORAL_TABLET | Freq: Four times a day (QID) | ORAL | Status: DC | PRN
  Administered 2014-08-14: 1 mg via ORAL
  Filled 2014-08-13: qty 1

## 2014-08-13 MED ORDER — LORAZEPAM 1 MG PO TABS: 0.0000 mg | ORAL_TABLET | Freq: Two times a day (BID) | ORAL | Status: DC

## 2014-08-13 MED ORDER — CLONIDINE HCL 0.1 MG PO TABS
0.1000 mg | ORAL_TABLET | Freq: Two times a day (BID) | ORAL | Status: DC | PRN
  Administered 2014-08-13: 0.1 mg via ORAL
  Filled 2014-08-13: qty 1

## 2014-08-13 MED ORDER — VITAMIN B-1 100 MG PO TABS
100.0000 mg | ORAL_TABLET | Freq: Every day | ORAL | Status: DC
  Administered 2014-08-14: 100 mg via ORAL
  Filled 2014-08-13: qty 1

## 2014-08-13 MED ORDER — LORAZEPAM 1 MG PO TABS
0.0000 mg | ORAL_TABLET | Freq: Four times a day (QID) | ORAL | Status: DC
  Administered 2014-08-13: 1 mg via ORAL
  Filled 2014-08-13: qty 1

## 2014-08-13 MED ORDER — FOLIC ACID 1 MG PO TABS
1.0000 mg | ORAL_TABLET | Freq: Every day | ORAL | Status: DC
  Administered 2014-08-13 – 2014-08-14 (×2): 1 mg via ORAL
  Filled 2014-08-13 (×2): qty 1

## 2014-08-13 MED ORDER — ADULT MULTIVITAMIN W/MINERALS CH
1.0000 | ORAL_TABLET | Freq: Every day | ORAL | Status: DC
  Administered 2014-08-14: 1 via ORAL
  Filled 2014-08-13: qty 1

## 2014-08-13 MED ORDER — LORAZEPAM 2 MG/ML IJ SOLN: 1.0000 mg | Freq: Four times a day (QID) | INTRAMUSCULAR | Status: DC | PRN

## 2014-08-13 NOTE — ED Notes (Signed)
Pt presents with complaint of depression, passive SI, no plan.  Pt reports he is frustrated with people in general and keeps things bottled up which does not help his situation.  Denies HI or AV hallucinations, denies feeling hopeless.  Pt reports he had a couple of beers and smoked marijuana.  Pt does not maintain eye contact, not forthcoming with information.  AAO x 3, no distress noted, calm and cooperative, monitoring for safety, Q 15 min checks in effect.

## 2014-08-13 NOTE — ED Notes (Addendum)
Pt was in the bed when  the nurse entered the room but did awaken to go the bathroom. He is cooperative but appears depressed and a little dishelved. Pt does contract for safety and denies SI and HI-Pt was found in his room crying and stated,"I just can't do this anymore." Pt stated he felt he wanted to hurt himself and and hurt others but was not sure who he wanted to hurt. He does contract for safety. Pt was given 1mg  of ativan for feeling anxious. Pt ate his lunch. NP made aware pts BP is elevated. 159/102, 93. Pt was given .1mg  of clonidine for elevated BP. He did inquire about medication for his nerves and was told that he is not due for it yet. 6:20pm-Pt began to cry again when given 1mg  of ativan. He stated he keeps hearing a male voice in his head. Report to Dominica Severin the Soil scientist.

## 2014-08-13 NOTE — ED Notes (Signed)
Pt. Noted sleeping in room. No complaints or concerns voiced. No distress or abnormal behavior noted. Will continue to monitor with security cameras. Q 15 minute rounds continue. 

## 2014-08-13 NOTE — Progress Notes (Signed)
[facility] Assessment Progress Note  The following facilities have been contacted in an effort to place this pt, with results as noted:  Bed available, information faxed, decision pending:  [facility]:  [facility] declined:  [facility] (no IPRS beds available)  Marisa Cyphers, MS Clinician 08/13/2014 @ 1550

## 2014-08-13 NOTE — ED Notes (Signed)
Pt. Alert and oriented in no distress denies VH and pain.  Pt. States he still has SI with out a plan and wants to "hurt people" Pt. Instructed to come to me with problems or concerns.Will continue to monitor for safety via security cameras and Q 15 minute checks.

## 2014-08-13 NOTE — Progress Notes (Signed)
  CARE MANAGEMENT ED NOTE 08/13/2014  Patient:  [name] [name], [name] [name]   Account Number:  [id number]  Date Initiated:  08/13/2014  Documentation initiated by:  Jackelyn Poling  Subjective/Objective Assessment:   43 yr old self pay [place] pt experiencing depression.  He went to check himself into [facility], but was told he needed medical clearance first.  Admits to SI/HI.        Subjective/Objective Assessment Detail:   pcp - pt states he recently received an orange card to change from [facility] to [address] [facility]  pcp is [facility] on [address]  [place]     Action/Plan:   spoke pt to about pcp services; updated epic   Action/Plan Detail:   Anticipated DC Date:       Status Recommendation to Physician:   Result of Recommendation:    Other ED Services  Consult Working Production assistant, radio  Other  Outpatient Services - Pt will follow up  PCP issues    Choice offered to / List presented to:            Status of service:  Completed, signed off  ED Comments:   ED Comments Detail:

## 2014-08-13 NOTE — ED Notes (Signed)
Pt sleeping at present, no distress noted, monitoring for safety, Q 15 min checks in effect.

## 2014-08-13 NOTE — ED Notes (Signed)
Report received from [facility]. Pt. Sleeping, respirations regular and unlabored. Will continue to monitor for safety via security cameras and Q 15 minute checks.

## 2014-08-13 NOTE — ED Notes (Signed)
When patient wakes up family wants him to call his brother [name] [name]

## 2014-08-13 NOTE — Consult Note (Signed)
[place] [facility]iatry Consult   Reason for Consult:  Suicidal ideations Referring Physician:  EDP Patient Identification: [name] [name] MRN:  [id number] Principal Diagnosis: Suicidal ideation Diagnosis:   Patient Active Problem List   Diagnosis Date Noted  . 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 Suicidal ideation [R45.851] 08/13/2014    Priority: High  . 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 VITAMIN D DEFICIENCY [E55.9] 09/15/2009  . BLOOD CHEMISTRY, ABNORMAL [R79.9] 09/15/2009  . LIVER FUNCTION TESTS, ABNORMAL, HX OF [Z86.39] 09/15/2009  . LUMBAGO [M54.5] 04/25/2009  . 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 HYPERTENSION [I10] 04/20/2008    Total Time spent with patient: 45 minutes  Subjective:   [name] [name] is a 44 y.o. male patient admitted with depression and suicidal ideations.  HPI:  The patient presented was sent to the ED from his primary care MD because of his suicidal ideations.  "I don't care if I'm living"  He has been having these thoughts for months but they have increased. [name] [name] reports he lives with his mother and the stressors have been building up.  He works at [employer] but does not voice stress from work.  Forwards little information except suicidal ideations, visibly depressed and crying at times.  He has been drinking and using cocaine on occasion but does not feel it is an issue.  No past history of psychiatric issues, denies hallucinations. HPI Elements:   Location:  generalized. Quality:  acute. Severity:  severe. Timing:  constant. Duration:  past week. Context:  stessors.  Past Medical History:  Past Medical History  Diagnosis Date  . Hypertension    History reviewed. No pertinent past surgical history. Family History:  Family History  Problem Relation Age of Onset  . Diabetes Other   . Hypertension Other    Social History:  History  Alcohol Use  .  Yes     History  Drug Use No    History   Social History  . Marital Status: Single    Spouse Name: N/A  . Number of Children: N/A  . Years of Education: N/A   Social History Main Topics  . Smoking status: Never Smoker   . Smokeless tobacco: Never Used  . Alcohol Use: Yes  . Drug Use: No  . Sexual Activity: Not on file   Other Topics Concern  . None   Social History Narrative   Additional Social History:                          Allergies:  No Known Allergies  Labs:  Results for orders placed or performed during the hospital encounter of 08/12/14 (from the past 48 hour(s))  Acetaminophen level     Status: Abnormal   Collection Time: 08/12/14  9:15 PM  Result Value Ref Range   Acetaminophen (Tylenol), Serum <10.0 (L) 10 - 30 ug/mL    Comment:        THERAPEUTIC CONCENTRATIONS VARY SIGNIFICANTLY. A RANGE OF 10-30 ug/mL MAY BE AN EFFECTIVE CONCENTRATION FOR MANY PATIENTS. HOWEVER, SOME ARE BEST TREATED AT CONCENTRATIONS OUTSIDE THIS RANGE. ACETAMINOPHEN CONCENTRATIONS >150 ug/mL AT 4 HOURS AFTER INGESTION AND >50 ug/mL AT 12 HOURS AFTER INGESTION ARE OFTEN ASSOCIATED WITH TOXIC REACTIONS.   CBC     Status: Abnormal   Collection Time: 08/12/14  9:15 PM  Result Value Ref Range   WBC 4.6 4.0 - 10.5 K/uL  RBC 4.51 4.22 - 5.81 MIL/uL   Hemoglobin 15.4 13.0 - 17.0 g/dL   HCT 44.7 39.0 - 52.0 %   MCV 99.1 78.0 - 100.0 fL   MCH 34.1 (H) 26.0 - 34.0 pg   MCHC 34.5 30.0 - 36.0 g/dL   RDW 12.1 11.5 - 15.5 %   Platelets 238 150 - 400 K/uL  Comprehensive metabolic panel     Status: Abnormal   Collection Time: 08/12/14  9:15 PM  Result Value Ref Range   Sodium 139 135 - 145 mmol/L   Potassium 3.9 3.5 - 5.1 mmol/L   Chloride 103 96 - 112 mmol/L   CO2 23 19 - 32 mmol/L   Glucose, Bld 93 70 - 99 mg/dL   BUN 16 6 - 23 mg/dL   Creatinine, Ser 0.82 0.50 - 1.35 mg/dL   Calcium 9.2 8.4 - 10.5 mg/dL   Total Protein 8.3 6.0 - 8.3 g/dL   Albumin 4.5 3.5 - 5.2  g/dL   AST 105 (H) 0 - 37 U/L   ALT 50 0 - 53 U/L   Alkaline Phosphatase 74 39 - 117 U/L   Total Bilirubin 0.9 0.3 - 1.2 mg/dL   GFR calc non Af Amer >90 >90 mL/min   GFR calc Af Amer >90 >90 mL/min    Comment: (NOTE) The eGFR has been calculated using the CKD EPI equation. This calculation has not been validated in all clinical situations. eGFR's persistently <90 mL/min signify possible Chronic Kidney Disease.    Anion gap 13 5 - 15  Ethanol (ETOH)     Status: Abnormal   Collection Time: 08/12/14  9:15 PM  Result Value Ref Range   Alcohol, Ethyl (B) 263 (H) 0 - 9 mg/dL    Comment:        LOWEST DETECTABLE LIMIT FOR SERUM ALCOHOL IS 11 mg/dL FOR MEDICAL PURPOSES ONLY   Salicylate level     Status: None   Collection Time: 08/12/14  9:15 PM  Result Value Ref Range   Salicylate Lvl <9.3 2.8 - 20.0 mg/dL  Urine Drug Screen     Status: Abnormal   Collection Time: 08/12/14 10:42 PM  Result Value Ref Range   Opiates NONE DETECTED NONE DETECTED   Cocaine POSITIVE (A) NONE DETECTED   Benzodiazepines NONE DETECTED NONE DETECTED   Amphetamines NONE DETECTED NONE DETECTED   Tetrahydrocannabinol POSITIVE (A) NONE DETECTED   Barbiturates NONE DETECTED NONE DETECTED    Comment:        DRUG SCREEN FOR MEDICAL PURPOSES ONLY.  IF CONFIRMATION IS NEEDED FOR ANY PURPOSE, NOTIFY LAB WITHIN 5 DAYS.        LOWEST DETECTABLE LIMITS FOR URINE DRUG SCREEN Drug Class       Cutoff (ng/mL) Amphetamine      1000 Barbiturate      200 Benzodiazepine   818 Tricyclics       299 Opiates          300 Cocaine          300 THC              50     Vitals: Blood pressure 162/107, pulse 89, temperature 98 F (36.7 C), temperature source Oral, resp. rate 18, height _0  (1.956 m), weight 185 lb (83.915 kg), SpO2 100 %.  Risk to Self: Is patient at risk for suicide?: Yes Risk to Others:   Prior Inpatient Therapy:   Prior Outpatient Therapy:    Current Facility-Administered  Medications   Medication Dose Route Frequency Provider Last Rate Last Dose  . acetaminophen (TYLENOL) tablet 650 mg  650 mg Oral Q4H PRN Al Corpus, PA-C      . alum & mag hydroxide-simeth (MAALOX/MYLANTA) 200-200-20 MG/5ML suspension 30 mL  30 mL Oral PRN Al Corpus, PA-C      . cloNIDine (CATAPRES) tablet 0.1 mg  0.1 mg Oral BID PRN Patrecia Pour, NP      . ibuprofen (ADVIL,MOTRIN) tablet 600 mg  600 mg Oral Q8H PRN Al Corpus, PA-C      . LORazepam (ATIVAN) tablet 1 mg  1 mg Oral Q8H PRN Al Corpus, PA-C   1 mg at 08/13/14 1158  . ondansetron (ZOFRAN) tablet 4 mg  4 mg Oral Q8H PRN [place], PA-C      . zolpidem [facility]) tablet 5 mg  5 mg Oral QHS PRN Al Corpus, PA-C       Current Outpatient Prescriptions  Medication Sig Dispense Refill  . HYDROcodone-acetaminophen (NORCO/VICODIN) 5-325 MG per tablet Take 1-2 tablets by mouth every 6 (six) hours as needed. 15 tablet 0  . naproxen (NAPROSYN) 500 MG tablet Take 500 mg by mouth 2 (two) times daily as needed for moderate pain (inflammation).    . predniSONE (DELTASONE) 10 MG tablet Take 10 mg by mouth daily with breakfast.    . HYDROcodone-acetaminophen (NORCO/VICODIN) 5-325 MG per tablet Take 1-2 tablets by mouth every 4 (four) hours as needed. (Patient not taking: Reported on 08/12/2014) 12 tablet 0  . ibuprofen (ADVIL,MOTRIN) 800 MG tablet Take 1 tablet (800 mg total) by mouth 3 (three) times daily. (Patient not taking: Reported on 08/12/2014) 21 tablet 0    Musculoskeletal: Strength & Muscle Tone: within normal limits Gait & Station: normal Patient leans: N/A  Psychiatric Specialty Exam:     Blood pressure 162/107, pulse 89, temperature 98 F (36.7 C), temperature source Oral, resp. rate 18, height _0  (1.956 m), weight 185 lb (83.915 kg), SpO2 100 %.Body mass index is 21.93 kg/(m^2).  General Appearance: Casual  Eye Contact::  Minimal  Speech:  Slow  Volume:  Decreased  Mood:  Depressed  Affect:  Congruent   Thought Process:  Coherent  Orientation:  Full (Time, Place, and Person)  Thought Content:  Rumination  Suicidal Thoughts:  Yes.  with intent/plan  Homicidal Thoughts:  Yes without intent/plan  Memory:  Immediate;   Fair Recent;   Fair Remote;   Fair  Judgement:  Poor  Insight:  Fair  Psychomotor Activity:  Decreased  Concentration:  Fair  Recall:  [employer] of Knowledge:Fair  Language: Good  Akathisia:  No  Handed:  Right  AIMS (if indicated):     Assets:  Leisure Time Physical Health Resilience  ADL's:  Intact  Cognition: WNL  Sleep:      Medical Decision Making: Review of Psycho-Social Stressors (1), Review or order clinical lab tests (1) and Review of New Medication or Change in Dosage (2)  Treatment Plan Summary: Daily contact with patient to assess and evaluate symptoms and progress in treatment, Medication management and Plan admit to inpatient psychiatric unit for stabilization  Plan:  Recommend psychiatric Inpatient admission when medically cleared. Disposition: Johny Sax, PMH-NP 08/13/2014 6:05 PM

## 2014-08-14 MED ORDER — FLUOXETINE HCL 20 MG PO CAPS
20.0000 mg | ORAL_CAPSULE | Freq: Every day | ORAL | Status: DC
Start: 2014-08-14 — End: 2014-08-15
  Administered 2014-08-14: 20 mg via ORAL
  Filled 2014-08-14 (×2): qty 1

## 2014-08-14 MED ORDER — CHLORDIAZEPOXIDE HCL 25 MG PO CAPS
25.0000 mg | ORAL_CAPSULE | Freq: Once | ORAL | Status: AC
  Administered 2014-08-14: 25 mg via ORAL
  Filled 2014-08-14: qty 1

## 2014-08-14 MED ORDER — ATENOLOL 50 MG PO TABS
50.0000 mg | ORAL_TABLET | Freq: Every day | ORAL | Status: DC
  Administered 2014-08-14: 50 mg via ORAL
  Filled 2014-08-14: qty 1

## 2014-08-14 MED ORDER — CLONIDINE HCL 0.1 MG PO TABS
0.1000 mg | ORAL_TABLET | Freq: Two times a day (BID) | ORAL | Status: DC
Start: 2014-08-14 — End: 2014-08-14
  Administered 2014-08-14: 0.1 mg via ORAL
  Filled 2014-08-14: qty 1

## 2014-08-14 MED ORDER — LORAZEPAM 1 MG PO TABS
1.0000 mg | ORAL_TABLET | Freq: Once | ORAL | Status: AC
  Administered 2014-08-14: 1 mg via ORAL
  Filled 2014-08-14: qty 1

## 2014-08-14 MED ORDER — CLONIDINE HCL 0.1 MG PO TABS
0.1000 mg | ORAL_TABLET | Freq: Once | ORAL | Status: AC
  Administered 2014-08-14: 0.1 mg via ORAL
  Filled 2014-08-14: qty 1

## 2014-08-14 NOTE — BH Assessment (Signed)
Pt referred to [place], [facility], and [place].   Ruben Reason, MA, LPCA, [facility]

## 2014-08-14 NOTE — ED Notes (Signed)
Pt. Noted sleeping in room. No complaints or concerns voiced. No distress or abnormal behavior noted. Will continue to monitor with security cameras. Q 15 minute rounds continue. 

## 2014-08-14 NOTE — ED Notes (Signed)
Pt reports he has been off his blood pressure medications for a long time.

## 2014-08-14 NOTE — ED Notes (Addendum)
Up on the phone, calm, has eaten lunch

## 2014-08-14 NOTE — Consult Note (Signed)
[place] [facility] Consult   Reason for Consult:  Suicidal ideations Referring Physician:  EDP Patient Identification: [name] [name] MRN:  [id number] Principal Diagnosis: Suicidal ideation Diagnosis:   Patient Active Problem List   Diagnosis Date Noted  . 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 Suicidal ideation [R45.851] 08/13/2014    Priority: High  . 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 VITAMIN D DEFICIENCY [E55.9] 09/15/2009  . BLOOD CHEMISTRY, ABNORMAL [R79.9] 09/15/2009  . LIVER FUNCTION TESTS, ABNORMAL, HX OF [Z86.39] 09/15/2009  . LUMBAGO [M54.5] 04/25/2009  . 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 HYPERTENSION [I10] 04/20/2008    Total Time spent with patient: 30 minutes  Subjective:   [name] [name] is a 44 y.o. male patient admitted with depression and suicidal ideations.  HPI:  The patient presented continues to endorse suicidal ideations, poor eye contact, crying at times.  He states things "built up" but no specific stressors identified, forwards little.  Denies homicidal ideations and hallucinations.  His brother can come get him after discharge from any psychiatric facility that is available. HPI Elements:   Location:  generalized. Quality:  acute. Severity:  severe. Timing:  constant. Duration:  past week. Context:  stessors.  Past Medical History:  Past Medical History  Diagnosis Date  . Hypertension    History reviewed. No pertinent past surgical history. Family History:  Family History  Problem Relation Age of Onset  . Diabetes Other   . Hypertension Other    Social History:  History  Alcohol Use  . Yes     History  Drug Use No    History   Social History  . Marital Status: Single    Spouse Name: N/A  . Number of Children: N/A  . Years of Education: N/A   Social History Main Topics  . Smoking status: Never Smoker   . Smokeless tobacco:  Never Used  . Alcohol Use: Yes  . Drug Use: No  . Sexual Activity: Not on file   Other Topics Concern  . None   Social History Narrative   Additional Social History:                          Allergies:  No Known Allergies  Labs:  Results for orders placed or performed during the hospital encounter of 08/12/14 (from the past 48 hour(s))  Acetaminophen level     Status: Abnormal   Collection Time: 08/12/14  9:15 PM  Result Value Ref Range   Acetaminophen (Tylenol), Serum <10.0 (L) 10 - 30 ug/mL    Comment:        THERAPEUTIC CONCENTRATIONS VARY SIGNIFICANTLY. A RANGE OF 10-30 ug/mL MAY BE AN EFFECTIVE CONCENTRATION FOR MANY PATIENTS. HOWEVER, SOME ARE BEST TREATED AT CONCENTRATIONS OUTSIDE THIS RANGE. ACETAMINOPHEN CONCENTRATIONS >150 ug/mL AT 4 HOURS AFTER INGESTION AND >50 ug/mL AT 12 HOURS AFTER INGESTION ARE OFTEN ASSOCIATED WITH TOXIC REACTIONS.   CBC     Status: Abnormal   Collection Time: 08/12/14  9:15 PM  Result Value Ref Range   WBC 4.6 4.0 - 10.5 K/uL   RBC 4.51 4.22 - 5.81 MIL/uL   Hemoglobin 15.4 13.0 - 17.0 g/dL   HCT 44.7 39.0 - 52.0 %   MCV 99.1 78.0 - 100.0 fL   MCH 34.1 (H) 26.0 - 34.0 pg   MCHC 34.5 30.0 - 36.0 g/dL   RDW 12.1 11.5 -  15.5 %   Platelets 238 150 - 400 K/uL  Comprehensive metabolic panel     Status: Abnormal   Collection Time: 08/12/14  9:15 PM  Result Value Ref Range   Sodium 139 135 - 145 mmol/L   Potassium 3.9 3.5 - 5.1 mmol/L   Chloride 103 96 - 112 mmol/L   CO2 23 19 - 32 mmol/L   Glucose, Bld 93 70 - 99 mg/dL   BUN 16 6 - 23 mg/dL   Creatinine, Ser 0.82 0.50 - 1.35 mg/dL   Calcium 9.2 8.4 - 10.5 mg/dL   Total Protein 8.3 6.0 - 8.3 g/dL   Albumin 4.5 3.5 - 5.2 g/dL   AST 105 (H) 0 - 37 U/L   ALT 50 0 - 53 U/L   Alkaline Phosphatase 74 39 - 117 U/L   Total Bilirubin 0.9 0.3 - 1.2 mg/dL   GFR calc non Af Amer >90 >90 mL/min   GFR calc Af Amer >90 >90 mL/min    Comment: (NOTE) The eGFR has been calculated  using the CKD EPI equation. This calculation has not been validated in all clinical situations. eGFR's persistently <90 mL/min signify possible Chronic Kidney Disease.    Anion gap 13 5 - 15  Ethanol (ETOH)     Status: Abnormal   Collection Time: 08/12/14  9:15 PM  Result Value Ref Range   Alcohol, Ethyl (B) 263 (H) 0 - 9 mg/dL    Comment:        LOWEST DETECTABLE LIMIT FOR SERUM ALCOHOL IS 11 mg/dL FOR MEDICAL PURPOSES ONLY   Salicylate level     Status: None   Collection Time: 08/12/14  9:15 PM  Result Value Ref Range   Salicylate Lvl <2.1 2.8 - 20.0 mg/dL  Urine Drug Screen     Status: Abnormal   Collection Time: 08/12/14 10:42 PM  Result Value Ref Range   Opiates NONE DETECTED NONE DETECTED   Cocaine POSITIVE (A) NONE DETECTED   Benzodiazepines NONE DETECTED NONE DETECTED   Amphetamines NONE DETECTED NONE DETECTED   Tetrahydrocannabinol POSITIVE (A) NONE DETECTED   Barbiturates NONE DETECTED NONE DETECTED    Comment:        DRUG SCREEN FOR MEDICAL PURPOSES ONLY.  IF CONFIRMATION IS NEEDED FOR ANY PURPOSE, NOTIFY LAB WITHIN 5 DAYS.        LOWEST DETECTABLE LIMITS FOR URINE DRUG SCREEN Drug Class       Cutoff (ng/mL) Amphetamine      1000 Barbiturate      200 Benzodiazepine   194 Tricyclics       174 Opiates          300 Cocaine          300 THC              50     Vitals: Blood pressure 155/108, pulse 92, temperature 98.7 F (37.1 C), temperature source Oral, resp. rate 16, height _0  (1.956 m), weight 185 lb (83.915 kg), SpO2 100 %.  Risk to Self: Is patient at risk for suicide?: Yes Risk to Others:   Prior Inpatient Therapy:   Prior Outpatient Therapy:    Current Facility-Administered Medications  Medication Dose Route Frequency Provider Last Rate Last Dose  . acetaminophen (TYLENOL) tablet 650 mg  650 mg Oral Q4H PRN Al Corpus, PA-C      . alum & mag hydroxide-simeth (MAALOX/MYLANTA) 200-200-20 MG/5ML suspension 30 mL  30 mL Oral PRN Al Corpus, PA-C      .  cloNIDine (CATAPRES) tablet 0.1 mg  0.1 mg Oral BID Patrecia Pour, NP      . folic acid (FOLVITE) tablet 1 mg  1 mg Oral Daily Patrecia Pour, NP   1 mg at 08/14/14 1012  . ibuprofen (ADVIL,MOTRIN) tablet 600 mg  600 mg Oral Q8H PRN Al Corpus, PA-C      . LORazepam (ATIVAN) tablet 1 mg  1 mg Oral Q6H PRN Patrecia Pour, NP       Or  . LORazepam (ATIVAN) injection 1 mg  1 mg Intravenous Q6H PRN Patrecia Pour, NP      . LORazepam (ATIVAN) tablet 0-4 mg  0-4 mg Oral Q6H Patrecia Pour, NP   Stopped at 08/14/14 [phone]   Followed by  . [START ON 08/15/2014] LORazepam (ATIVAN) tablet 0-4 mg  0-4 mg Oral Q12H Patrecia Pour, NP      . multivitamin with minerals tablet 1 tablet  1 tablet Oral Daily Patrecia Pour, NP   1 tablet at 08/14/14 1012  . ondansetron (ZOFRAN) tablet 4 mg  4 mg Oral Q8H PRN [place], PA-C      . thiamine (VITAMIN B-1) tablet 100 mg  100 mg Oral Daily Patrecia Pour, NP   100 mg at 08/14/14 1014   Or  . thiamine (B-1) injection 100 mg  100 mg Intravenous Daily Patrecia Pour, NP      . zolpidem (AMBIEN) tablet 5 mg  5 mg Oral QHS PRN Al Corpus, PA-C       Current Outpatient Prescriptions  Medication Sig Dispense Refill  . HYDROcodone-acetaminophen (NORCO/VICODIN) 5-325 MG per tablet Take 1-2 tablets by mouth every 6 (six) hours as needed. 15 tablet 0  . naproxen (NAPROSYN) 500 MG tablet Take 500 mg by mouth 2 (two) times daily as needed for moderate pain (inflammation).    . predniSONE (DELTASONE) 10 MG tablet Take 10 mg by mouth daily with breakfast.    . HYDROcodone-acetaminophen (NORCO/VICODIN) 5-325 MG per tablet Take 1-2 tablets by mouth every 4 (four) hours as needed. (Patient not taking: Reported on 08/12/2014) 12 tablet 0  . ibuprofen (ADVIL,MOTRIN) 800 MG tablet Take 1 tablet (800 mg total) by mouth 3 (three) times daily. (Patient not taking: Reported on 08/12/2014) 21 tablet 0    Musculoskeletal: Strength & Muscle Tone: within  normal limits Gait & Station: normal Patient leans: N/A  Psychiatric Specialty Exam:     Blood pressure 155/108, pulse 92, temperature 98.7 F (37.1 C), temperature source Oral, resp. rate 16, height _0  (1.956 m), weight 185 lb (83.915 kg), SpO2 100 %.Body mass index is 21.93 kg/(m^2).  General Appearance: Casual  Eye Contact::  Poor  Speech:  Slow  Volume:  Decreased  Mood:  Depressed  Affect:  Congruent  Thought Process:  Coherent  Orientation:  Full (Time, Place, and Person)  Thought Content:  Rumination  Suicidal Thoughts:  Yes.  with intent/plan  Homicidal Thoughts:  Yes without intent/plan  Memory:  Immediate;   Fair Recent;   Fair Remote;   Fair  Judgement:  Poor  Insight:  Fair  Psychomotor Activity:  Decreased  Concentration:  Fair  Recall:  [employer] of Knowledge:Fair  Language: Good  Akathisia:  No  Handed:  Right  AIMS (if indicated):     Assets:  Leisure Time Physical Health Resilience  ADL's:  Intact  Cognition: WNL  Sleep:      Medical Decision Making: Review of  Psycho-Social Stressors (1), Review or order clinical lab tests (1) and Review of New Medication or Change in Dosage (2)  Treatment Plan Summary: Daily contact with patient to assess and evaluate symptoms and progress in treatment, Medication management and Plan admit to inpatient psychiatric unit for stabilization  Plan:  Recommend psychiatric Inpatient admission when medically cleared. Disposition: Johny Sax, PMH-NP 08/14/2014 12:44 PM Patient seen face-to-face for psychiatric evaluation, chart reviewed and case discussed with the physician extender and developed treatment plan. Reviewed the information documented and agree with the treatment plan. Corena Pilgrim, MD

## 2014-08-14 NOTE — ED Notes (Addendum)
Pt denies any symptoms of withdrawal, but is sitting on the bed crying.  Support/encouragement given.

## 2014-08-14 NOTE — BH Assessment (Signed)
Per Sinou at [facility] pt has now been declined by their doctor, stating pt needs a higher level of care than they can provide.   Lear Ng, [facility] Triage Specialist 08/14/2014 6:19 AM

## 2014-08-14 NOTE — ED Notes (Signed)
Pt. Noted sleeping in room. No complaints or concerns voiced. No distress or abnormal behavior noted. Will continue to monitor with security cameras. Q 15 minute rounds continue.l

## 2014-08-14 NOTE — ED Notes (Signed)
Pt. Noted in room. No complaints or concerns voiced. No distress or abnormal behavior noted. Will continue to monitor with security cameras. Q 15 minute rounds continue. 

## 2014-08-14 NOTE — ED Notes (Signed)
up to the bathroom

## 2014-08-14 NOTE — BH Assessment (Signed)
Per Sinou at [facility] pt is being considered for placement on a voluntary status. They would like confirmation from pt that someone will be able to pick him up there once discharged. [phone]. Informed nurse, and updated on shift report for TTS staff.   Lear Ng, [facility] Triage Specialist 08/14/2014 5:36 AM

## 2014-08-14 NOTE — ED Notes (Signed)
Report received from [name] Rambo RN. Pt. Alert and oriented in no distress denies SI, HI, AVH and pain.  Pt. Instructed to come to me with problems or concerns.Will continue to monitor for safety via security cameras and Q 15 minute checks. 

## 2014-08-15 ENCOUNTER — Inpatient Hospital Stay (HOSPITAL_COMMUNITY)
Admission: AD | Admit: 2014-08-15 | Discharge: 2014-08-16 | DRG: 885 | Disposition: A | Discharge status: Home / Self Care | Payer: Federal, State, Local not specified - Other | Source: Intra-hospital | Attending: Psychiatry | Admitting: Psychiatry

## 2014-08-15 ENCOUNTER — Encounter (HOSPITAL_COMMUNITY): Payer: Self-pay

## 2014-08-15 DIAGNOSIS — R45850 Homicidal ideations: Secondary | ICD-10-CM | POA: Diagnosis not present

## 2014-08-15 DIAGNOSIS — G4700 Insomnia, unspecified: Secondary | ICD-10-CM | POA: Diagnosis present

## 2014-08-15 DIAGNOSIS — F419 Anxiety disorder, unspecified: Secondary | ICD-10-CM | POA: Diagnosis present

## 2014-08-15 DIAGNOSIS — Z833 Family history of diabetes mellitus: Secondary | ICD-10-CM

## 2014-08-15 DIAGNOSIS — Z8249 Family history of ischemic heart disease and other diseases of the circulatory system: Secondary | ICD-10-CM

## 2014-08-15 DIAGNOSIS — R45851 Suicidal ideations: Secondary | ICD-10-CM | POA: Diagnosis present

## 2014-08-15 DIAGNOSIS — I10 Essential (primary) hypertension: Secondary | ICD-10-CM | POA: Diagnosis present

## 2014-08-15 DIAGNOSIS — F322 Major depressive disorder, single episode, severe without psychotic features: Secondary | ICD-10-CM | POA: Diagnosis not present

## 2014-08-15 DIAGNOSIS — F332 Major depressive disorder, recurrent severe without psychotic features: Principal | ICD-10-CM | POA: Diagnosis present

## 2014-08-15 MED ORDER — TRAZODONE HCL 50 MG PO TABS
50.0000 mg | ORAL_TABLET | Freq: Once | ORAL | Status: AC
  Administered 2014-08-15: 50 mg via ORAL
  Filled 2014-08-15: qty 1

## 2014-08-15 MED ORDER — ADULT MULTIVITAMIN W/MINERALS CH
1.0000 | ORAL_TABLET | Freq: Every day | ORAL | Status: DC
  Administered 2014-08-15 – 2014-08-16 (×2): 1 via ORAL
  Filled 2014-08-15 (×2): qty 1
  Filled 2014-08-15: qty 3
  Filled 2014-08-15 (×3): qty 1

## 2014-08-15 MED ORDER — PREDNISONE (PAK) 10 MG PO TABS: 10.0000 mg | ORAL_TABLET | ORAL | Status: AC

## 2014-08-15 MED ORDER — ALUM & MAG HYDROXIDE-SIMETH 200-200-20 MG/5ML PO SUSP: 30.0000 mL | ORAL | Status: DC | PRN

## 2014-08-15 MED ORDER — PREDNISONE 10 MG PO TABS: 10.0000 mg | ORAL_TABLET | Freq: Every day | ORAL | Status: DC

## 2014-08-15 MED ORDER — THIAMINE HCL 100 MG/ML IJ SOLN: 100.0000 mg | Freq: Every day | INTRAMUSCULAR | Status: DC

## 2014-08-15 MED ORDER — FOLIC ACID 1 MG PO TABS
1.0000 mg | ORAL_TABLET | Freq: Every day | ORAL | Status: DC
  Administered 2014-08-15 – 2014-08-16 (×2): 1 mg via ORAL
  Filled 2014-08-15 (×3): qty 1
  Filled 2014-08-15: qty 3

## 2014-08-15 MED ORDER — ONDANSETRON HCL 4 MG PO TABS: 4.0000 mg | ORAL_TABLET | Freq: Three times a day (TID) | ORAL | Status: DC | PRN

## 2014-08-15 MED ORDER — VITAMIN B-1 100 MG PO TABS
100.0000 mg | ORAL_TABLET | Freq: Every day | ORAL | Status: DC
  Administered 2014-08-15 – 2014-08-16 (×2): 100 mg via ORAL
  Filled 2014-08-15 (×5): qty 1

## 2014-08-15 MED ORDER — HYDROXYZINE HCL 25 MG PO TABS: 25.0000 mg | ORAL_TABLET | Freq: Four times a day (QID) | ORAL | Status: DC | PRN

## 2014-08-15 MED ORDER — THIAMINE HCL 100 MG/ML IJ SOLN: 100.0000 mg | Freq: Once | INTRAMUSCULAR | Status: DC

## 2014-08-15 MED ORDER — ADULT MULTIVITAMIN W/MINERALS CH: 1.0000 | ORAL_TABLET | Freq: Every day | ORAL | Status: DC

## 2014-08-15 MED ORDER — CHLORDIAZEPOXIDE HCL 25 MG PO CAPS
25.0000 mg | ORAL_CAPSULE | Freq: Four times a day (QID) | ORAL | Status: DC
  Administered 2014-08-15 (×3): 25 mg via ORAL
  Filled 2014-08-15 (×5): qty 1

## 2014-08-15 MED ORDER — FLUOXETINE HCL 20 MG PO CAPS
20.0000 mg | ORAL_CAPSULE | Freq: Every day | ORAL | Status: DC
  Administered 2014-08-15 – 2014-08-16 (×2): 20 mg via ORAL
  Filled 2014-08-15: qty 1
  Filled 2014-08-15: qty 14
  Filled 2014-08-15: qty 1

## 2014-08-15 MED ORDER — NAPROXEN 500 MG PO TABS
500.0000 mg | ORAL_TABLET | Freq: Two times a day (BID) | ORAL | Status: DC | PRN
  Filled 2014-08-15: qty 6

## 2014-08-15 MED ORDER — ATENOLOL 50 MG PO TABS
50.0000 mg | ORAL_TABLET | Freq: Every day | ORAL | Status: DC
  Administered 2014-08-15 – 2014-08-16 (×2): 50 mg via ORAL
  Filled 2014-08-15: qty 1
  Filled 2014-08-15: qty 3
  Filled 2014-08-15 (×2): qty 1
  Filled 2014-08-15: qty 2

## 2014-08-15 MED ORDER — PREDNISONE (PAK) 10 MG PO TABS
10.0000 mg | ORAL_TABLET | ORAL | Status: AC
  Administered 2014-08-15: 10 mg via ORAL

## 2014-08-15 MED ORDER — PREDNISONE (PAK) 10 MG PO TABS
20.0000 mg | ORAL_TABLET | Freq: Every morning | ORAL | Status: AC
  Administered 2014-08-15: 20 mg via ORAL
  Filled 2014-08-15: qty 21

## 2014-08-15 MED ORDER — ACETAMINOPHEN 325 MG PO TABS: 650.0000 mg | ORAL_TABLET | Freq: Four times a day (QID) | ORAL | Status: DC | PRN

## 2014-08-15 MED ORDER — CHLORDIAZEPOXIDE HCL 25 MG PO CAPS: 25.0000 mg | ORAL_CAPSULE | Freq: Four times a day (QID) | ORAL | Status: DC | PRN

## 2014-08-15 MED ORDER — PREDNISONE (PAK) 10 MG PO TABS: 10.0000 mg | ORAL_TABLET | Freq: Four times a day (QID) | ORAL | Status: DC

## 2014-08-15 MED ORDER — CHLORDIAZEPOXIDE HCL 25 MG PO CAPS: 25.0000 mg | ORAL_CAPSULE | Freq: Three times a day (TID) | ORAL | Status: DC

## 2014-08-15 MED ORDER — LOPERAMIDE HCL 2 MG PO CAPS
2.0000 mg | ORAL_CAPSULE | ORAL | Status: DC | PRN
Start: 2014-08-15 — End: 2014-08-16

## 2014-08-15 MED ORDER — ACETAMINOPHEN 325 MG PO TABS: 650.0000 mg | ORAL_TABLET | ORAL | Status: DC | PRN

## 2014-08-15 MED ORDER — PREDNISONE (PAK) 10 MG PO TABS: 20.0000 mg | ORAL_TABLET | Freq: Every evening | ORAL | Status: DC

## 2014-08-15 MED ORDER — CHLORDIAZEPOXIDE HCL 25 MG PO CAPS: 25.0000 mg | ORAL_CAPSULE | Freq: Every day | ORAL | Status: DC

## 2014-08-15 MED ORDER — PREDNISONE (PAK) 10 MG PO TABS
20.0000 mg | ORAL_TABLET | Freq: Every evening | ORAL | Status: AC
  Administered 2014-08-15: 20 mg via ORAL

## 2014-08-15 MED ORDER — IBUPROFEN 600 MG PO TABS: 600.0000 mg | ORAL_TABLET | Freq: Three times a day (TID) | ORAL | Status: DC | PRN

## 2014-08-15 MED ORDER — TRAZODONE HCL 50 MG PO TABS
ORAL_TABLET | ORAL | Status: AC
  Filled 2014-08-15: qty 1

## 2014-08-15 MED ORDER — VITAMIN B-1 100 MG PO TABS: 100.0000 mg | ORAL_TABLET | Freq: Every day | ORAL | Status: DC

## 2014-08-15 MED ORDER — CHLORDIAZEPOXIDE HCL 25 MG PO CAPS: 25.0000 mg | ORAL_CAPSULE | ORAL | Status: DC

## 2014-08-15 MED ORDER — ONDANSETRON 4 MG PO TBDP: 4.0000 mg | ORAL_TABLET | Freq: Four times a day (QID) | ORAL | Status: DC | PRN

## 2014-08-15 MED ORDER — TRAZODONE HCL 50 MG PO TABS
50.0000 mg | ORAL_TABLET | Freq: Every evening | ORAL | Status: DC | PRN
  Administered 2014-08-15: 50 mg via ORAL
  Filled 2014-08-15: qty 28

## 2014-08-15 MED ORDER — MAGNESIUM HYDROXIDE 400 MG/5ML PO SUSP: 30.0000 mL | Freq: Every day | ORAL | Status: DC | PRN

## 2014-08-15 MED ORDER — PREDNISONE (PAK) 10 MG PO TABS
10.0000 mg | ORAL_TABLET | Freq: Three times a day (TID) | ORAL | Status: AC
  Administered 2014-08-15 – 2014-08-16 (×3): 10 mg via ORAL

## 2014-08-15 NOTE — Progress Notes (Addendum)
D Ronav spends most of the time in his bed. He says he feels " very bad" today and that he " doesn't want to be around a lot of people ."    A He is quiet, does not initiiate conversation with this Probation officer and " stays in the background" . He does go to the cafe' to eat his meals and he completes his morning  assessment and on it he writes he denies SI  and rates his depression, hopelessness and anxiety  " " 8/7/7/". He approached this nurse this afternoon, stated " I want to go home..  Get me my clothes." and when this  nurse explained to him the process to be dc'd, he walked off and said " I'm going home... Just get  my clothes". RN spoke with MD immediately - who met with  Patient and social worker and processed with pt and pt willing and able to take all medications after meeting. He denies any feelings of withdrawal.    R Safety is in place.

## 2014-08-15 NOTE — [facility] Group Notes (Signed)
[place] Group Notes:  (Clinical Social Work)  08/15/2014   1:15-2:15PM  Summary of Progress/Problems:  The main focus of today's process group was to   identify the patient's current support system and decide on other supports that can be put in place.  The picture on workbook was used to discuss why additional supports are needed.  An emphasis was placed on using counselor, doctor, therapy groups, 12-step groups, and problem-specific support groups to expand supports.   There was also an extensive discussion about what constitutes a healthy support versus an unhealthy support.  Using music as one support was discussed, and several songs were played as a demonstration.  Also discussed the importance of educating oneself about one's diagnosis in order to be able to garner support from others for same.  The patient expressed full comprehension of the concepts presented.    Type of Therapy:  Process Group  Participation Level:  Active  Participation Quality:  Attentive  Affect:  Blunted and Depressed  Cognitive:  Appropriate   Insight:  Limited  Engagement in Therapy:  Limited  Modes of Intervention:  Education,  Support and [employer], LCSW 08/15/2014, 2:15pm

## 2014-08-15 NOTE — H&P (Signed)
Psychiatric Admission Assessment Adult  Patient Identification: [name] [name] MRN:  [id number] Date of Evaluation:  08/15/2014 Chief Complaint:  DEPRESSION Principal Diagnosis: MDD Diagnosis:   Patient Active Problem List   Diagnosis Date Noted  . Depression, major, single episode, severe [F32.2] 08/15/2014  . 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 Suicidal ideation [R45.851] 08/13/2014  . 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 VITAMIN D DEFICIENCY [E55.9] 09/15/2009  . BLOOD CHEMISTRY, ABNORMAL [R79.9] 09/15/2009  . LIVER FUNCTION TESTS, ABNORMAL, HX OF [Z86.39] 09/15/2009  . LUMBAGO [M54.5] 04/25/2009  . 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 HYPERTENSION [I10] 04/20/2008   History of Present Illness:: Patient notes that he has depression  Starting a couple of months ago, he notes that it has gotten worse, SI with no plan, no previous attempts, HI no one in particular, no plans. Anger outbursts, sadness, hopeless, helpless, fatigue, he is able to go to work at [employer], appetite about the same, denies weight loss, poor sleep, some irritability, but "not too bad." He denies paranoia.   Elements:  Location:  Adult unit [place]. Quality:  acute. Severity:  moderate to severe. Timing:  worsening over the previous 2 months. Duration:  two months. Context:  increased depression with SI. Associated Signs/Symptoms: Depression Symptoms:  depressed mood, anhedonia, insomnia, psychomotor retardation, feelings of worthlessness/guilt, difficulty concentrating, hopelessness, suicidal thoughts without plan, anxiety, disturbed sleep, (Hypo) Manic Symptoms:  Irritable Mood, Anxiety Symptoms:  Excessive Worry, Psychotic Symptoms:  denies PTSD Symptoms:denies Total Time spent with patient: 1 hour  Past Medical History:  Past Medical History  Diagnosis Date  . Hypertension    History reviewed. No pertinent past surgical  history. Family History:  Family History  Problem Relation Age of Onset  . Diabetes Other   . Hypertension Other    Social History:  History  Alcohol Use  . Yes    Comment: (2) 24 oz beers every other day pt reports     History  Drug Use  . Yes  . Special: Marijuana    Comment: pt reports marijuana use occasionally         Cocaine "Random use" History   Social History  . Marital Status: Single    Spouse Name: N/A  . Number of Children: N/A  . Years of Education: N/A   Social History Main Topics  . Smoking status: Never Smoker   . Smokeless tobacco: Never Used  . Alcohol Use: Yes     Comment: (2) 24 oz beers every other day pt reports  . Drug Use: Yes    Special: Marijuana     Comment: pt reports marijuana use occasionally  . Sexual Activity: Yes    Birth Control/ Protection: None   Other Topics Concern  . None   Social History Narrative   Additional Social History: Denies rehab or detox in the past.    Musculoskeletal: Strength & Muscle Tone: within normal limits Gait & Station: normal Patient leans: N/A  Psychiatric Specialty Exam: Physical Exam  Constitutional: He appears well-developed and well-nourished.  Psychiatric: Judgment normal. His speech is delayed. He is slowed and withdrawn. Thought content is not paranoid and not delusional. Cognition and memory are impaired. He exhibits a depressed mood. He expresses homicidal and suicidal ideation. He expresses no suicidal plans and no homicidal plans.  Patient is seen and the chart is reviewed. I agree with the exam completed in the Ed with no exceptions.    Review of Systems  Constitutional: Negative.   HENT: Negative.   Eyes: Negative.   Respiratory: Negative.   Cardiovascular: Negative.   Gastrointestinal: Negative.   Genitourinary: Negative.   Musculoskeletal: Positive for joint pain.  Skin: Negative.   Neurological: Negative.   Endo/Heme/Allergies: Negative.   Psychiatric/Behavioral: Positive  for depression, suicidal ideas and substance abuse. The patient is nervous/anxious and has insomnia.     Blood pressure 103/80, pulse 94, temperature 97.5 F (36.4 C), temperature source Oral, resp. rate 16, height 6' 5"  (1.956 m), weight 85.73 kg (189 lb).Body mass index is 22.41 kg/(m^2).  General Appearance: Disheveled  Eye Sport and exercise psychologist::  Fair  Speech:  Clear and Coherent and Slow  Volume:  Decreased  Mood:  Depressed  Affect:  Congruent  Thought Process:  Goal Directed  Orientation:  Full (Time, Place, and Person)  Thought Content:  WDL  Suicidal Thoughts:  Yes.  without intent/plan  Homicidal Thoughts:  Yes.  without intent/plan  Memory:  NA  Judgement:  Fair  Insight:  Shallow  Psychomotor Activity:  Decreased  Concentration:  Poor  Recall:  NA  Fund of Knowledge:Good  Language: Good  Akathisia:  No  Handed:  Left  AIMS (if indicated):     Assets:  Communication Skills Desire for [place] Talents/Skills Vocational/Educational  ADL's:  Intact  Cognition: WNL  Sleep:  Number of Hours: 2   Risk to Self: Is patient at risk for suicide?: Yes Risk to Others:   Prior Inpatient Therapy:   Prior Outpatient Therapy:    Alcohol Screening:  Patient refused Alcohol Screening Tool: Yes 1. How often do you have a drink containing alcohol?: 2 to 3 times a week 2. How many drinks containing alcohol do you have on a typical day when you are drinking?: 1 or 2 3. How often do you have six or more drinks on one occasion?: Never Preliminary Score: 0 9. Have you or someone else been injured as a result of your drinking?: No 10. Has a relative or friend or a doctor or another health worker been concerned about your drinking or suggested you cut down?: No Alcohol Use Disorder Identification Test Final Score (AUDIT): 3 Brief Intervention: Patient declined brief intervention  Allergies:  No Known Allergies Lab Results: No results found for this  or any previous visit (from the past 83 hour(s)). Current Medications: Current Facility-Administered Medications  Medication Dose Route Frequency Provider Last Rate Last Dose  . acetaminophen (TYLENOL) tablet 650 mg  650 mg Oral Q6H PRN Patrecia Pour, NP      . alum & mag hydroxide-simeth (MAALOX/MYLANTA) 200-200-20 MG/5ML suspension 30 mL  30 mL Oral Q4H PRN Patrecia Pour, NP      . atenolol (TENORMIN) tablet 50 mg  50 mg Oral Daily Dara Hoyer, PA-C   50 mg at 08/15/14 1004  . chlordiazePOXIDE (LIBRIUM) capsule 25 mg  25 mg Oral Q6H PRN Dara Hoyer, PA-C      . chlordiazePOXIDE (LIBRIUM) capsule 25 mg  25 mg Oral QID Dara Hoyer, PA-C   25 mg at 08/15/14 1005   Followed by  . [START ON 08/16/2014] chlordiazePOXIDE (LIBRIUM) capsule 25 mg  25 mg Oral TID Dara Hoyer, PA-C       Followed by  . [START ON 08/17/2014] chlordiazePOXIDE (LIBRIUM) capsule 25 mg  25 mg Oral BH-qamhs Dara Hoyer, PA-C       Followed by  . [START ON 08/19/2014] chlordiazePOXIDE (LIBRIUM) capsule  25 mg  25 mg Oral Daily Dara Hoyer, PA-C      . folic acid (FOLVITE) tablet 1 mg  1 mg Oral Daily Patrecia Pour, NP   1 mg at 08/15/14 0800  . hydrOXYzine (ATARAX/VISTARIL) tablet 25 mg  25 mg Oral Q6H PRN Dara Hoyer, PA-C      . loperamide (IMODIUM) capsule 2-4 mg  2-4 mg Oral PRN Dara Hoyer, PA-C      . magnesium hydroxide (MILK OF MAGNESIA) suspension 30 mL  30 mL Oral Daily PRN Patrecia Pour, NP      . multivitamin with minerals tablet 1 tablet  1 tablet Oral Daily Patrecia Pour, NP   1 tablet at 08/15/14 1004  . naproxen (NAPROSYN) tablet 500 mg  500 mg Oral BID PRN Dara Hoyer, PA-C      . ondansetron (ZOFRAN-ODT) disintegrating tablet 4 mg  4 mg Oral Q6H PRN Dara Hoyer, PA-C      . predniSONE (STERAPRED UNI-PAK) tablet 10 mg  10 mg Oral PC lunch Dara Hoyer, PA-C      . predniSONE (STERAPRED UNI-PAK) tablet 10 mg  10 mg Oral PC supper Dara Hoyer, PA-C      . Derrill Memo  ON 08/16/2014] predniSONE (STERAPRED UNI-PAK) tablet 10 mg  10 mg Oral 3 x daily with food Dara Hoyer, PA-C      . Derrill Memo ON 08/17/2014] predniSONE (STERAPRED UNI-PAK) tablet 10 mg  10 mg Oral 4X daily taper Dara Hoyer, PA-C      . predniSONE (STERAPRED UNI-PAK) tablet 20 mg  20 mg Oral Nightly Dara Hoyer, PA-C      . Derrill Memo ON 08/16/2014] predniSONE (STERAPRED UNI-PAK) tablet 20 mg  20 mg Oral Nightly Dara Hoyer, PA-C      . thiamine (VITAMIN B-1) tablet 100 mg  100 mg Oral Daily Patrecia Pour, NP   100 mg at 08/15/14 1005   Or  . thiamine (B-1) injection 100 mg  100 mg Intravenous Daily Patrecia Pour, NP       PTA Medications: Prescriptions prior to admission  Medication Sig Dispense Refill Last Dose  . ibuprofen (ADVIL,MOTRIN) 800 MG tablet Take 1 tablet (800 mg total) by mouth 3 (three) times daily. (Patient not taking: Reported on 08/12/2014) 21 tablet 0 Completed Course at Unknown time  . naproxen (NAPROSYN) 500 MG tablet Take 500 mg by mouth 2 (two) times daily as needed for moderate pain (inflammation).     . predniSONE (DELTASONE) 10 MG tablet Take 10 mg by mouth daily with breakfast.       Previous Psychotropic Medications: No   Substance Abuse History in the last 12 months:  No.  States he drinks 3-4 x a week. Random Cocaine use THC 1 joint will last a week.    Consequences of Substance Abuse: DUI 20 years ago.  Results for orders placed or performed during the hospital encounter of 08/12/14 (from the past 72 hour(s))  Acetaminophen level     Status: Abnormal   Collection Time: 08/12/14  9:15 PM  Result Value Ref Range   Acetaminophen (Tylenol), Serum <10.0 (L) 10 - 30 ug/mL    Comment:        THERAPEUTIC CONCENTRATIONS VARY SIGNIFICANTLY. A RANGE OF 10-30 ug/mL MAY BE AN EFFECTIVE CONCENTRATION FOR MANY PATIENTS. HOWEVER, SOME ARE BEST TREATED AT CONCENTRATIONS OUTSIDE THIS RANGE. ACETAMINOPHEN CONCENTRATIONS >150 ug/mL AT 4 HOURS  AFTER INGESTION  AND >50 ug/mL AT 12 HOURS AFTER INGESTION ARE OFTEN ASSOCIATED WITH TOXIC REACTIONS.   CBC     Status: Abnormal   Collection Time: 08/12/14  9:15 PM  Result Value Ref Range   WBC 4.6 4.0 - 10.5 K/uL   RBC 4.51 4.22 - 5.81 MIL/uL   Hemoglobin 15.4 13.0 - 17.0 g/dL   HCT 44.7 39.0 - 52.0 %   MCV 99.1 78.0 - 100.0 fL   MCH 34.1 (H) 26.0 - 34.0 pg   MCHC 34.5 30.0 - 36.0 g/dL   RDW 12.1 11.5 - 15.5 %   Platelets 238 150 - 400 K/uL  Comprehensive metabolic panel     Status: Abnormal   Collection Time: 08/12/14  9:15 PM  Result Value Ref Range   Sodium 139 135 - 145 mmol/L   Potassium 3.9 3.5 - 5.1 mmol/L   Chloride 103 96 - 112 mmol/L   CO2 23 19 - 32 mmol/L   Glucose, Bld 93 70 - 99 mg/dL   BUN 16 6 - 23 mg/dL   Creatinine, Ser 0.82 0.50 - 1.35 mg/dL   Calcium 9.2 8.4 - 10.5 mg/dL   Total Protein 8.3 6.0 - 8.3 g/dL   Albumin 4.5 3.5 - 5.2 g/dL   AST 105 (H) 0 - 37 U/L   ALT 50 0 - 53 U/L   Alkaline Phosphatase 74 39 - 117 U/L   Total Bilirubin 0.9 0.3 - 1.2 mg/dL   GFR calc non Af Amer >90 >90 mL/min   GFR calc Af Amer >90 >90 mL/min    Comment: (NOTE) The eGFR has been calculated using the CKD EPI equation. This calculation has not been validated in all clinical situations. eGFR's persistently <90 mL/min signify possible Chronic Kidney Disease.    Anion gap 13 5 - 15  Ethanol (ETOH)     Status: Abnormal   Collection Time: 08/12/14  9:15 PM  Result Value Ref Range   Alcohol, Ethyl (B) 263 (H) 0 - 9 mg/dL    Comment:        LOWEST DETECTABLE LIMIT FOR SERUM ALCOHOL IS 11 mg/dL FOR MEDICAL PURPOSES ONLY   Salicylate level     Status: None   Collection Time: 08/12/14  9:15 PM  Result Value Ref Range   Salicylate Lvl <4.7 2.8 - 20.0 mg/dL  Urine Drug Screen     Status: Abnormal   Collection Time: 08/12/14 10:42 PM  Result Value Ref Range   Opiates NONE DETECTED NONE DETECTED   Cocaine POSITIVE (A) NONE DETECTED   Benzodiazepines NONE DETECTED NONE  DETECTED   Amphetamines NONE DETECTED NONE DETECTED   Tetrahydrocannabinol POSITIVE (A) NONE DETECTED   Barbiturates NONE DETECTED NONE DETECTED    Comment:        DRUG SCREEN FOR MEDICAL PURPOSES ONLY.  IF CONFIRMATION IS NEEDED FOR ANY PURPOSE, NOTIFY LAB WITHIN 5 DAYS.        LOWEST DETECTABLE LIMITS FOR URINE DRUG SCREEN Drug Class       Cutoff (ng/mL) Amphetamine      1000 Barbiturate      200 Benzodiazepine   425 Tricyclics       956 Opiates          300 Cocaine          300 THC              50     Observation Level/Precautions:  Continuous Observation  Laboratory:  Elevated liver enzymes, BAL 263,  UDS + cocaine, THC  Psychotherapy:    Medications:   As written  Consultations:   If needed  Discharge Concerns:  Increased risk for relapse, access to care  Estimated LOS:  5-7 days  Other:     Psychological Evaluations: No   Treatment Plan Summary: Daily contact with patient to assess and evaluate symptoms and progress in treatment and Medication management 1. Admit for crisis management and stabilization. 2. Medication management to reduce current symptoms to base line and improve the patient's overall level of functioning. 3. Treat health problems as indicated. 4. Develop treatment plan to decrease risk of relapse upon discharge and to reduce the need for readmission. 5. Psycho-social education regarding relapse prevention and self care. 6. Health care follow up as needed for medical problems. 7. Restart home medications where appropriate.  Medical Decision Making:  Review of Psycho-Social Stressors (1), Established Problem, Worsening (2) and New Problem, with no additional work-up planned (3)  I certify that inpatient services furnished can reasonably be expected to improve the patient's condition.   Marlane Hatcher. [place] RPAC 12:08 PM 08/15/2014   Patient Case discussed with NP and I have met with patient. 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 States he struggles with depression and had been feeling more depressed recently. Identifies financial difficulties as only stress. Chart mentions that another stress was mother declining cognitively but patient denies this at present. Patient describes regular alcohol consumption, usually 24-48 oz of beer per day. He denies any history of WDL seizures or DTs, denies blackouts . (+) DUI years ago. Of note, Admission BAL 263 , and UDS positive for cocaine and cannabis . He presented to ED reporting depression, sadness, and passive SI. Today, he reports he is feeling better and he is hoping for discharge soon, because he states he needs to return to work and does not want to jeopardize his job. Denies any SI today. Currently vitals are stable. No tremors, no diaphoresis, no acute distress  He states He has not been on any psychiatric medications in the past, and is agreeing to an antidepressant trial. Assessment- Major Depression, No psychotic features, Alcohol Abuse  Plan- start Prozac 20 mgrs QAM, We discussed rationale and patient agrees . Alcohol Detox protocol to minimize risk of potential WDL.

## 2014-08-15 NOTE — Clinical Social Work Note (Signed)
Clinical Social Work Note  At pt request and with his written consent, CSW called and left message for supervisor Berdine Addison at [employer] [phone]) stating he would not be able to work on Monday 08/16/14 at [place] due to hospitalization within [facility].  Also informed supervisor he would bring letter with him at return to work.  Selmer Dominion, LCSW 08/15/2014, 3:13 PM

## 2014-08-15 NOTE — BHH Suicide Risk Assessment (Signed)
[facility] INPATIENT:  Family/Significant Other Suicide Prevention Education  Suicide Prevention Education:  Patient Refusal for Family/Significant Other Suicide Prevention Education: The patient [name] [name] has refused to provide written consent for family/significant other to be provided Family/Significant Other Suicide Prevention Education during admission and/or prior to discharge.  Physician notified.  Suicide prevention education was provided to patient directly, along with written brochure.  Pt states he does have access to a gun, although it is not in his home.  He refused explicitly to allow staff to contact family member or friend to remove weapon.  Lysle Dingwall 08/15/2014, 3:14 PM

## 2014-08-15 NOTE — BHH Suicide Risk Assessment (Addendum)
[facility] Admission Suicide Risk Assessment   Nursing information obtained from:    Demographic factors:   44 year old man, single, lives with mother, employed  Current Mental Status:   See below Loss Factors:   describes financial difficulties as major stressor Historical Factors:   Depression,  Risk Reduction Factors:   resilience, employment  Total Time spent with patient: 45 minutes Principal Problem: MDD Diagnosis:   Patient Active Problem List   Diagnosis Date Noted  . Depression, major, single episode, severe [F32.2] 08/15/2014  . 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 Suicidal ideation [R45.851] 08/13/2014  . 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 VITAMIN D DEFICIENCY [E55.9] 09/15/2009  . BLOOD CHEMISTRY, ABNORMAL [R79.9] 09/15/2009  . LIVER FUNCTION TESTS, ABNORMAL, HX OF [Z86.39] 09/15/2009  . LUMBAGO [M54.5] 04/25/2009  . 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 HYPERTENSION [I10] 04/20/2008     Continued Clinical Symptoms:  Alcohol Use Disorder Identification Test Final Score (AUDIT): 3 The "Alcohol Use Disorders Identification Test", Guidelines for Use in Primary Care, Second Edition.  World Pharmacologist [facility] [employer]). Score between 0-7:  no or low risk or alcohol related problems. Score between 8-15:  moderate risk of alcohol related problems. Score between 16-19:  high risk of alcohol related problems. Score 20 or above:  warrants further diagnostic evaluation for alcohol dependence and treatment.   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 States he struggles with depression and had been feeling more depressed recently. Identifies financial difficulties as only stress. Chart mentions that another stress was mother declining cognitively but patient denies this at present.  Patient describes regular alcohol consumption, usually  24-48 oz of beer per day. He denies any history of WDL seizures or DTs, denies blackouts . (+) DUI years ago. Of note, Admission BAL 263 , and UDS positive for cocaine and cannabis . He presented to ED reporting depression, sadness, and passive SI.  Today, he reports he is feeling better and he is hoping for discharge soon, because he states he needs to return to work and does not want to jeopardize his job. Denies any SI today. Currently vitals are stable. No tremors, no diaphoresis, no acute distress  He states  He has not been on any psychiatric medications in the past, and is agreeing to an antidepressant trial. Assessment-  Major Depression, No psychotic features, Alcohol Abuse  Plan- start Prozac 20 mgrs QAM,  We discussed rationale and patient agrees . Alcohol Detox protocol to minimize risk of potential WDL.     Musculoskeletal: Strength & Muscle Tone: within normal limits Gait & Station: normal Patient leans: N/A  Psychiatric Specialty Exam: Physical Exam  Review of Systems  Constitutional: Negative for fever and chills.  Respiratory: Negative for cough and shortness of breath.   Cardiovascular: Negative for chest pain.  Gastrointestinal: Negative for nausea, vomiting and abdominal pain.  Genitourinary: Negative for dysuria, urgency and frequency.  Neurological: Negative for seizures and headaches.  Psychiatric/Behavioral: Positive for depression.    Blood pressure 103/80, pulse 94, temperature 97.5 F (36.4 C), temperature source Oral, resp. rate 16, height 6\' 5"  (1.956 m), weight 189 lb (85.73 kg).Body mass index is 22.41 kg/(m^2).  General Appearance: Fairly Groomed  Engineer, water::  Fair  Speech:  Normal Rate  Volume:  Decreased  Mood:  Depressed and mildly irritable  Affect:  Appropriate  Thought Process:  Goal Directed and Linear  Orientation:  Other:  fully alert and attentive  Thought Content:  no hallucinations, no delusions, currently focused on being discharged  soon so as to not jeopardize job  Suicidal Thoughts:  No- at this time denies any plan or intention of hurting self or anyone else  Homicidal Thoughts:  No  Memory:  recent and remote grossly intact   Judgement:  Fair  Insight:  Fair  Psychomotor Activity:  Decreased  Concentration:  Good  Recall:  Good  Fund of Knowledge:Good  Language: Good  Akathisia:  Negative  Handed:  Right  AIMS (if indicated):     Assets:  Desire for Improvement Resilience Vocational/Educational  Sleep:  Number of Hours: 2  Cognition: WNL  ADL's:  Impaired     COGNITIVE FEATURES THAT CONTRIBUTE TO RISK:  Closed-mindedness    SUICIDE RISK:   Moderate:  Frequent suicidal ideation with limited intensity, and duration, some specificity in terms of plans, no associated intent, good self-control, limited dysphoria/symptomatology, some risk factors present, and identifiable protective factors, including available and accessible social support.  PLAN OF CARE: Patient will be admitted to inpatient psychiatric unit for stabilization and safety. Will provide and encourage milieu participation. Provide medication management and maked adjustments as needed.  Also, has been started on alcohol detox protocol to minimize potential WDL symptoms. Will follow daily.    Medical Decision Making:  Review of Psycho-Social Stressors (1), Review or order clinical lab tests (1), Established Problem, Worsening (2) and Review of Medication Regimen & Side Effects (2)  I certify that inpatient services furnished can reasonably be expected to improve the patient's condition.   COBOS, FERNANDO 08/15/2014, 12:55 PM

## 2014-08-15 NOTE — Tx Team (Signed)
Initial Interdisciplinary Treatment Plan   PATIENT STRESSORS: Substance abuse   PATIENT STRENGTHS: Ability for insight Average or above average intelligence   PROBLEM LIST: Problem List/Patient Goals Date to be addressed Date deferred Reason deferred Estimated date of resolution  Depression 08/15/2014     Anger issues      Etoh            Patient reports that he would like to start medication for his depression.                               DISCHARGE CRITERIA:  Improved stabilization in mood, thinking, and/or behavior Safe-care adequate arrangements made  PRELIMINARY DISCHARGE PLAN: Return to previous living arrangement  PATIENT/FAMIILY INVOLVEMENT: This treatment plan has been presented to and reviewed with the patient, DREYDEN ROHRMAN, and/or family member  The patient and family have been given the opportunity to ask questions and make suggestions.  Aurora Mask 08/15/2014, 2:40 AM

## 2014-08-15 NOTE — BHH Counselor (Signed)
Adult Comprehensive Assessment  Patient ID: STACEY MAURA, male   DOB: 1970-06-22, 44 y.o.   MRN: [id number]  Information Source: Information source: Patient  Current Stressors:  Educational / Learning stressors: Denies stressors Employment / Job issues: Denies stressors - works daily Family Relationships: Denies Engineer, mining / Lack of resources (include bankruptcy): Ordinary Housing / Lack of housing: Denies stressors Physical health (include injuries & life threatening diseases): Denies stressors Social relationships: Denies stressors Substance abuse: Denies stressors Bereavement / Loss: Denies stressors - nothing recent  Living/Environment/Situation:  Living Arrangements: Parent (Lives with mother) Living conditions (as described by patient or guardian): Safe neighborhood, has own room How long has patient lived in current situation?: Lifetime, off and on What is atmosphere in current home: Loving, Supportive, Comfortable  Family History:  Marital status: Single Does patient have children?: No  Childhood History:  By whom was/is the patient raised?: Both parents Description of patient's relationship with caregiver when they were a child: Good relationship with both parents Patient's description of current relationship with people who raised him/her: Father died about 17 years ago.  Mother is still living - lives with her - "great" relationship. Does patient have siblings?: Yes Number of Siblings: 4 Description of patient's current relationship with siblings: 1 deceased brother, 2 living brothers, 1 sister - gets along wih them welll Did patient suffer any verbal/emotional/physical/sexual abuse as a child?: No Did patient suffer from severe childhood neglect?: No Has patient ever been sexually abused/assaulted/raped as an adolescent or adult?: No Was the patient ever a victim of a crime or a disaster?: No Witnessed domestic violence?: No Has patient been effected by  domestic violence as an adult?: No  Education:  Highest grade of school patient has completed: 12th grade graduate Currently a student?: No Learning disability?: No  Employment/Work Situation:   Employment situation: Employed Where is patient currently employed?: [employer] How long has patient been employed?: 8 months Patient's job has been impacted by current illness: Yes Describe how patient's job has been impacted: Does not want to miss work Monday - 6 am.  At the very least will need a letter to get back to work. 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 Has patient ever been in the [employer]?: No Has patient ever served in Recruitment consultant?: No  Financial Resources:   Financial resources: Income from employment Presenter, broadcasting) Does patient have a Programmer, applications or guardian?: No  Alcohol/Substance Abuse:   What has been your use of drugs/alcohol within the last 12 months?: Alcohol several times a week, has started smoking marijuana again If attempted suicide, did drugs/alcohol play a role in this?: No Alcohol/Substance Abuse Treatment Hx: Denies past history Has alcohol/substance abuse ever caused legal problems?: Yes (20 years ago)  Social Support System:   Patient's Community Support System: Manufacturing engineer System: Family - brothers, sister, mother and friends Type of faith/religion: Baptist How does patient's faith help to cope with current illness?: Prayer  Leisure/Recreation:   Leisure and Hobbies: Ride motorcycle, playing gold  Strengths/Needs:   What things does the patient do well?: Fishing, mechanical skills In what areas does patient struggle / problems for patient: Depression, financial  Discharge Plan:   Does patient have access to transportation?: Yes Will patient be returning to same living situation after discharge?: Yes Currently receiving community  mental health services: No If no, would patient like referral  for services when discharged?: Yes (What county?) ([place]) Does patient have financial barriers related to discharge medications?: Yes Patient description of barriers related to discharge medications: Small income, no insurance  Summary/Recommendations:   Summary and Recommendations (to be completed by the evaluator): Chen is a 44yo male who was hospitalized with increased depression for many years.  Assessment states he endorsed SI and HI with a plan to shoot self but no intent, and he is now denying this.  He exhibit thought blocking in the assessment, not currently.  Has access to a gun, is not willing to sign consent for staff to talk with anyone in his life, so SPE was provided to him.  Has a history of harm toward others, although reports no serious injuries to others.  Has a court date in April. Assessment indicated mother with whom he lives has Alzheimer's and he is caregiver - he denies this.  The patient would benefit from safety monitoring, medication evaluation, psychoeducation, group therapy, and discharge planning to link with ongoing resources. The patient does not smoke, does not need referral to [facility] for smoking cessation.  The Discharge Process and Patient Involvement form was reviewed with patient at the end of the Psychosocial Assessment, and the patient confirmed understanding and signed that document, which was placed in the paper chart.  The patient and CSW reviewed the identified goals for treatment, and the patient verbalized understanding and agreement.   Lysle Dingwall. 08/15/2014

## 2014-08-15 NOTE — Progress Notes (Addendum)
Patient voluntarily admitted requesting help with his depression, he reports that he would like to be started on medication to help with his depression and has dealt with this for a while and his depression seems to be getting worse. Patient reports that he drinks beers daily, (2) 24 ounces along with occasional marijuana use. His UDS was positive for THC and cocaine. Medical hx include HTN. He has swollen left knee and reports it being swollen for a while. He denies si and visual hallucinations but reports that he has auditory hallucinations. He was oriented to unit, meal given and safety maintained on unit with 15 min checks.

## 2014-08-15 NOTE — Progress Notes (Signed)
Psychoeducational Group Note  Date:  08/15/2014 Time: 1015 Group Topic/Focus:  Making Healthy Choices:   The focus of this group is to help patients identify negative/unhealthy choices they were using prior to admission and identify positive/healthier coping strategies to replace them upon discharge.  Participation Level:  Patient did not attend  Participation Quality:    Affect:    Cognitive:    Insight:    Engagement in Group:    Additional Comments:    Lauralyn Primes 11:00 AM. 08/15/2014

## 2014-08-16 MED ORDER — ATENOLOL 50 MG PO TABS: 50.0000 mg | ORAL_TABLET | Freq: Every day | ORAL | Status: DC

## 2014-08-16 MED ORDER — ADULT MULTIVITAMIN W/MINERALS CH: 1.0000 | ORAL_TABLET | Freq: Every day | ORAL | Status: DC

## 2014-08-16 MED ORDER — FLUOXETINE HCL 20 MG PO CAPS: 20.0000 mg | ORAL_CAPSULE | Freq: Every day | ORAL | Status: DC

## 2014-08-16 MED ORDER — PREDNISONE (PAK) 10 MG PO TABS: ORAL_TABLET | Freq: Four times a day (QID) | ORAL | Status: DC

## 2014-08-16 MED ORDER — FOLIC ACID 1 MG PO TABS: 1.0000 mg | ORAL_TABLET | Freq: Every day | ORAL | Status: DC

## 2014-08-16 MED ORDER — TRAZODONE HCL 50 MG PO TABS: 50.0000 mg | ORAL_TABLET | Freq: Every evening | ORAL | Status: DC | PRN

## 2014-08-16 NOTE — Progress Notes (Signed)
  [facility] Adult Case Management Discharge Plan :  Will you be returning to the same living situation after discharge:  Yes,  patient plans to return home At discharge, do you have transportation home?: Yes,  patient reports access to transportation Do you have the ability to pay for your medications: Yes,  patient will be provided with medication samples and prescriptions at discharge  Release of information consent forms completed and in the chart;  Patient's signature needed at discharge.  Patient to Follow up at: Follow-up Information    Follow up with [facility]. Go on 08/19/2014.   Why:  3:00pm - call to confirm appointment to follow with primary care physician   Contact information:   [address] [place]  [address]  Telephone:  [phone]      Follow up with [facility].   Specialty:  Behavioral Health   Why:  Walk-in clinic Monday-Friday between 8 am to 3 pm for assessment for therapy and medication management services.    Contact information:   [place] [address] [phone]       Patient denies SI/HI: Yes,  denies    Safety Planning and Suicide Prevention discussed: Yes,  with patient  Have you used any form of tobacco in the last 30 days? (Cigarettes, Smokeless Tobacco, Cigars, and/or Pipes): No  Has patient been referred to the Quitline?: No, Patient is not a smoker.  Rosely Fernandez, Casimiro Needle 08/16/2014, 10:59 AM

## 2014-08-16 NOTE — BHH Suicide Risk Assessment (Addendum)
[facility] Discharge Suicide Risk Assessment   Demographic Factors:  44 year old male, lives alone,  No children, employed.  Total Time spent with patient: 30 minutes  Musculoskeletal: Strength & Muscle Tone: within normal limits- no tremors, no diaphoresis, calm, no acute distress or discomfort, no tachycardia. Gait & Station: normal Patient leans: N/A  Psychiatric Specialty Exam: Physical Exam  ROS  Blood pressure 145/106, pulse 86, temperature 98 F (36.7 C), temperature source Oral, resp. rate 18, height 6\' 5"  (1.956 m), weight 189 lb (85.73 kg).Body mass index is 22.41 kg/(m^2).  General Appearance: improved grooming  Eye Contact::  Good  Speech:  Normal Rate409  Volume:  Normal  Mood:  improved, denies depression or sadness   Affect:  Congruent  Thought Process:  Goal Directed and Linear  Orientation:  Full (Time, Place, and Person)  Thought Content:  no hallucinations, no delusions, not internally preoccupied   Suicidal Thoughts:  No- denies any suicidal or homicidal ideations  Homicidal Thoughts:  No  Memory:  Recent and Remote grossly intact  Judgement:  Other:  improved   Insight:  Fair  Psychomotor Activity:  Normal  Concentration:  Good  Recall:  Good  Fund of Knowledge:Good  Language: Good  Akathisia:  Negative  Handed:  Right  AIMS (if indicated):     Assets:  Desire for Improvement Housing Resilience Vocational/Educational  Sleep:  Number of Hours: 2  Cognition: WNL  ADL's: improved    Have you used any form of tobacco in the last 30 days? (Cigarettes, Smokeless Tobacco, Cigars, and/or Pipes): No  Has this patient used any form of tobacco in the last 30 days? (Cigarettes, Smokeless Tobacco, Cigars, and/or Pipes) No  Mental Status Per Nursing Assessment::   On Admission:     Current Mental Status by Physician: At this time patient is improved compared to admission. He denies depression, denies sadness, and presents with a stable mood and a congruent  affect. No thought disorder, no SI, no HI, no hallucinations, no delusions, 0x3. He has not developed any significant alcohol withdrawal symptoms, and does not appear to be in any acute distress or discomfort.  Loss Factors: Some financial issues   Historical Factors: No prior psychiatric admissions , no history of suicide attempts, no history of psychosis. Patient is vague about alcohol abuse/ substance abuse , and states he drinks irregularly, although sometimes binges. BAL was 263 upon admission and UDS positive for cocaine and cannabis .  Risk Reduction Factors:   Employed and Positive coping skills or problem solving skills  Continued Clinical Symptoms:  As noted, at this time improved, denying depression, no SI or HI, no psychotic symptoms, behavior on unit calm and in good control. Has not developed any withdrawal symptoms. * Of note, patient on steroid course for inflamed knee- it had been started just prior to his admission.  Cognitive Features That Contribute To Risk:  Closed-mindedness    Suicide Risk:  Mild:  Suicidal ideation of limited frequency, intensity, duration, and specificity.  There are no identifiable plans, no associated intent, mild dysphoria and related symptoms, good self-control (both objective and subjective assessment), few other risk factors, and identifiable protective factors, including available and accessible social support.  Principal Problem: Depression, major, single episode, severe Discharge Diagnoses:  Patient Active Problem List   Diagnosis Date Noted  . Depression, major, single episode, severe [F32.2] 08/15/2014  . 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 Suicidal ideation [R45.851] 08/13/2014  .  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 VITAMIN D DEFICIENCY [E55.9] 09/15/2009  . BLOOD CHEMISTRY, ABNORMAL [R79.9] 09/15/2009  . LIVER FUNCTION TESTS, ABNORMAL, HX OF [Z86.39] 09/15/2009  .  LUMBAGO [M54.5] 04/25/2009  . 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 HYPERTENSION [I10] 04/20/2008    Follow-up Information    Follow up with [facility]. Go on 08/19/2014.   Why:  3:00pm - call to confirm appointment to follow with primary care physician   Contact information:   [address] [place]  [address]  Telephone:  [phone]      Follow up with [facility].   Specialty:  Behavioral Health   Why:  Walk-in clinic Monday-Friday between 8 am to 3 pm for assessment for therapy and medication management services.    Contact information:   [place] [address] [phone]       Plan Of Care/Follow-up recommendations:  Activity:  As tolerated Diet:  Low sodium, heart healthy Tests:  NA Other:  See below   Is patient on multiple antipsychotic therapies at discharge:  No   Has Patient had three or more failed trials of antipsychotic monotherapy by history:  No  Recommended Plan for Multiple Antipsychotic Therapies: NA   Patient is requesting discharge, and as above, there are no current grounds for any involuntary commitment. He plans to return home. He is encouraged to abstain from alcohol and drugs. Follow up as above. Has upcoming appointment at [facility] for medical management. He is aware of elevated BP and encouraged to follow up for management.     Langston Summerfield 08/16/2014, 11:33 AM

## 2014-08-16 NOTE — Progress Notes (Signed)
Writer received report from [employer]. Writer entered patients room and observed him lying in bed asleep with eyes closed and respirations even and unlabored. No distress noted. Safety maintained on unit with 15 min checks.

## 2014-08-16 NOTE — Progress Notes (Signed)
Recreation Therapy Notes  Date: 03.28.2016 Time: 9:30am Location: [address] Group Room   Group Topic: Stress Management  Goal Area(s) Addresses:  Patient will actively participate in stress management techniques presented during session.   Behavioral Response: Did not attend.   Laureen Ochs Ronny Korff, LRT/CTRS  Kendal Raffo L 08/16/2014 10:16 AM

## 2014-08-16 NOTE — BHH Group Notes (Signed)
[facility] LCSW Aftercare Discharge Planning Group Note  08/16/2014  8:45 AM  Participation Quality: Did Not Attend. Patient invited to participate but declined.  Tilden Fossa, MSW, [place] Worker [facility] [phone]

## 2014-08-16 NOTE — Progress Notes (Signed)
Patient ID: [name] [name], male   DOB: 1970-07-04, 44 y.o.   MRN: [id number] D-States he is going to be discharged today, but writer is unaware of this. Dr. Has not seen him yet, a question for him. Pleasant. Declined am Librium today  Because he is not having any withdrawal sx, and states has not had any alcohol in one week.  A-Medications as ordered, except for Librium. Monitored for safety. Support offered. R-No complaints. Isolative. States he needs to go so he can return to work.

## 2014-08-16 NOTE — Progress Notes (Signed)
Discharge home per order. Sent home with a week of medicines and month of Rx, His mom and brother came to pick him up. He feels ready to go home, and will be going to work tomorrow, and he really likes his job at [employer]. Affect brightens appropriately in conversation. Reviewed with him his discharge follow up plans and his Rx's and he verbalized his understanding. All person property returned to him. Denies any thoughts to hurt self or others. Escorted to lobby for discharge.

## 2014-08-16 NOTE — Discharge Summary (Signed)
Physician Discharge Summary Note  Patient:  [name] [name] is an 44 y.o., male MRN:  [id number] DOB:  1970/11/23 Patient phone:  [phone] (home)  Patient address:   [place] [address],  Total Time spent with patient: 30 minutes  Date of Admission:  08/15/2014 Date of Discharge: 08/16/14  Reason for Admission:  Depressive Symptoms, Suicidal Ideation  Principal Problem: Depression, major, single episode, severe Discharge Diagnoses: Patient Active Problem List   Diagnosis Date Noted  . Depression, major, single episode, severe [F32.2] 08/15/2014  . 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 Suicidal ideation [R45.851] 08/13/2014  . 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 VITAMIN D DEFICIENCY [E55.9] 09/15/2009  . BLOOD CHEMISTRY, ABNORMAL [R79.9] 09/15/2009  . LIVER FUNCTION TESTS, ABNORMAL, HX OF [Z86.39] 09/15/2009  . LUMBAGO [M54.5] 04/25/2009  . 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 HYPERTENSION [I10] 04/20/2008    Musculoskeletal: Strength & Muscle Tone: within normal limits- no tremors, no diaphoresis, calm, no acute distress or discomfort, no tachycardia. Gait & Station: normal Patient leans: N/A  Psychiatric Specialty Exam: Physical Exam  Psychiatric: He has a normal mood and affect. His speech is normal and behavior is normal. Judgment and thought content normal. Cognition and memory are normal.    Review of Systems  Constitutional: Negative.   HENT: Negative.   Eyes: Negative.   Respiratory: Negative.   Cardiovascular: Negative.   Gastrointestinal: Negative.   Genitourinary: Negative.   Musculoskeletal: Negative.   Skin: Negative.   Neurological: Negative.   Endo/Heme/Allergies: Negative.   Psychiatric/Behavioral: Positive for depression (Stabilized with treatments) and substance abuse (UDS positive for cocaine/marijuana ). Negative for suicidal ideas, hallucinations and  memory loss. The patient is not nervous/anxious and does not have insomnia.     Blood pressure 145/106, pulse 86, temperature 98 F (36.7 C), temperature source Oral, resp. rate 18, height 6\' 5"  (1.956 m), weight 85.73 kg (189 lb).Body mass index is 22.41 kg/(m^2).  See Physician SRA     Past Medical History:  Past Medical History  Diagnosis Date  . Hypertension    History reviewed. No pertinent past surgical history. Family History:  Family History  Problem Relation Age of Onset  . Diabetes Other   . Hypertension Other    Social History:  History  Alcohol Use  . Yes    Comment: (2) 24 oz beers every other day pt reports     History  Drug Use  . Yes  . Special: Marijuana    Comment: pt reports marijuana use occasionally    History   Social History  . Marital Status: Single    Spouse Name: N/A  . Number of Children: N/A  . Years of Education: N/A   Social History Main Topics  . Smoking status: Never Smoker   . Smokeless tobacco: Never Used  . Alcohol Use: Yes     Comment: (2) 24 oz beers every other day pt reports  . Drug Use: Yes    Special: Marijuana     Comment: pt reports marijuana use occasionally  . Sexual Activity: Yes    Birth Control/ Protection: None   Other Topics Concern  . None   Social History Narrative    Risk to Self: Is patient at risk for suicide?: Yes What has been your use of drugs/alcohol within the last 12 months?: Alcohol several times a week, has started smoking marijuana again Risk to Others:   Prior Inpatient Therapy:  Prior Outpatient Therapy:    Level of Care:  OP  Hospital Course:   [name] [name] is a 44 y.o., single, African-American male who presents as a walk-in to [name] [name] after seeing his primary care physician, whom told him that he needed to talk with someone about his depression. Pt presents with depressed mood and congruent affect, becoming tearful throughout assessment at times. He has difficulty articulating at times  and says he does not know how to open up to people well or explain how he is feeling. Eye-contact is fair and pt often looks at the floor. Thought process is not always linear and pt exhibits thought-blocking. Speech is usually coherent. Pt has freedom of movement but c/o some knee pain. Pt is well-oriented. Pt c/o SI and HI with plans but no intent. He reports increasing sx of depression recently but states that he has been struggling with depression for many years. He c/o insomnia, racing thoughts, decreased appetite with weight loss, fatigue, feelings of guilt, anhedonia, tearfulness, anger outbursts and increased irritability, social isolation, HI, and SI with plan to shoot self.         [name] [name] was admitted to the adult unit. He was evaluated and his symptoms were identified. Medication management was discussed and initiated. The patient was not taking any psychiatric medications prior to his admission. He completed the librium protocol for purpose of alcohol detox. The patient was vague about his alcohol use but admitted to binge drinking along with alcohol level being 263 on admission. He was started on Prozac 20 mg daily for depressive symptoms. He was oriented to the unit and encouraged to participate in unit programming. Medical problems were identified and treated appropriately. The patient was started on Tenormin 50 mg daily for Hypertension.  Home medication was restarted as needed.        The patient was evaluated each day by a clinical provider to ascertain the patient's response to treatment.  Improvement was noted by the patient's report of decreasing symptoms, improved sleep and appetite, affect, medication tolerance, behavior, and participation in unit programming.  He was asked each day to complete a self inventory noting mood, mental status, pain, new symptoms, anxiety and concerns.         He responded well to medication and being in a therapeutic and supportive environment. Positive  and appropriate behavior was noted and the patient was motivated for recovery.  The patient worked closely with the treatment team and case manager to develop a discharge plan with appropriate goals. Coping skills, problem solving as well as relaxation therapies were also part of the unit programming. The patient's length of stay was one day. He requested discharge from the treatment team and denied suicidal ideation. There were no apparent grounds for an involuntary commitment. He was encouraged to abstain from alcohol and drugs.          By the day of discharge he was in much improved condition than upon admission.  Symptoms were reported as significantly decreased or resolved completely. The patient denied SI/HI and voiced no AVH. He was motivated to continue taking medication with a goal of continued improvement in mental health.  Rolland Porter Dusek was discharged home with a plan to follow up as noted below. The patient was provided with sample medications and prescriptions at time of discharge. He left [facility] in stable condition with all belongings returned to him. He has upcoming appointment at [facility] for medical management. He is aware  of elevated BP and encouraged to follow up for management.   Consults:  psychiatry  Significant Diagnostic Studies:  Chemistry panel, CBC, UA, UDS positive for cocaine, marijuana   Discharge Vitals:   Blood pressure 145/106, pulse 86, temperature 98 F (36.7 C), temperature source Oral, resp. rate 18, height 6\' 5"  (1.956 m), weight 85.73 kg (189 lb). Body mass index is 22.41 kg/(m^2). Lab Results:   No results found for this or any previous visit (from the past 72 hour(s)).  Physical Findings: AIMS: Facial and Oral Movements Muscles of Facial Expression: None, normal Lips and Perioral Area: None, normal Jaw: None, normal Tongue: None, normal,Extremity Movements Upper (arms, wrists, hands, fingers): None, normal Lower (legs, knees, ankles, toes): None,  normal, Trunk Movements Neck, shoulders, hips: None, normal, Overall Severity Severity of abnormal movements (highest score from questions above): None, normal Incapacitation due to abnormal movements: None, normal Patient's awareness of abnormal movements (rate only patient's report): No Awareness, Dental Status Current problems with teeth and/or dentures?: No Does patient usually wear dentures?: No  CIWA:  CIWA-Ar Total: 0 COWS:      See Psychiatric Specialty Exam and Suicide Risk Assessment completed by Attending Physician prior to discharge.  Discharge destination:  Home  Is patient on multiple antipsychotic therapies at discharge:  No   Has Patient had three or more failed trials of antipsychotic monotherapy by history:  No  Recommended Plan for Multiple Antipsychotic Therapies: NA  Discharge Instructions    Discharge instructions    Complete by:  As directed   Please follow up with your Primary Care Provider for further management of medical problems such as Hypertension. You were provided with a thirty day prescription for Tenormin.            Medication List    STOP taking these medications        ibuprofen 800 MG tablet  Commonly known as:  ADVIL,MOTRIN     predniSONE 10 MG tablet  Commonly known as:  DELTASONE  Replaced by:  predniSONE 10 MG tablet      TAKE these medications      Indication   atenolol 50 MG tablet  Commonly known as:  TENORMIN  Take 1 tablet (50 mg total) by mouth daily.   Indication:  High Blood Pressure     FLUoxetine 20 MG capsule  Commonly known as:  PROZAC  Take 1 capsule (20 mg total) by mouth daily.   Indication:  Major Depressive Disorder     folic acid 1 MG tablet  Commonly known as:  FOLVITE  Take 1 tablet (1 mg total) by mouth daily.   Indication:  Deficiency of Folic Acid in the Diet     multivitamin with minerals Tabs tablet  Take 1 tablet by mouth daily.   Indication:  Vitamin Supplementation     naproxen 500 MG  tablet  Commonly known as:  NAPROSYN  Take 500 mg by mouth 2 (two) times daily as needed for moderate pain (inflammation).      predniSONE 10 MG tablet  Commonly known as:  STERAPRED UNI-PAK  Take by mouth taper from 4 doses each day to 1 dose and stop. Take one tablet daily until finished.  Start taking on:  08/17/2014      traZODone 50 MG tablet  Commonly known as:  DESYREL  Take 1 tablet (50 mg total) by mouth at bedtime as needed and may repeat dose one time if needed for sleep.   Indication:  Trouble Sleeping           Follow-up Information    Follow up with [facility]. Go on 08/19/2014.   Why:  3:00pm - call to confirm appointment to follow with primary care physician   Contact information:   [address] [place]  [address]  Telephone:  [phone]      Follow up with [facility].   Specialty:  Behavioral Health   Why:  Walk-in clinic Monday-Friday between 8 am to 3 pm for assessment for therapy and medication management services.    Contact information:   [place] [address] [phone]       Follow-up recommendations:   Activity: As tolerated Diet: Low sodium, heart healthy Tests: NA Other: See below   Comments:   Take all your medications as prescribed by your mental healthcare provider.  Report any adverse effects and or reactions from your medicines to your outpatient provider promptly.  Patient is instructed and cautioned to not engage in alcohol and or illegal drug use while on prescription medicines.  In the event of worsening symptoms, patient is instructed to call the crisis hotline, 911 and or go to the nearest ED for appropriate evaluation and treatment of symptoms.  Follow-up with your primary care provider for your other medical issues, concerns and or health care needs.   Total Discharge Time: Greater than 30 minutes  Signed: DAVIS, LAURA NP-C 08/16/2014, 11:21 AM   Patient seen, Suicide  Assessment Completed.  Disposition Plan Reviewed

## 2014-08-18 NOTE — Progress Notes (Signed)
Patient Discharge Instructions:  After Visit Summary (AVS):   Faxed to:  08/18/14 Discharge Summary Note:   Faxed to:  08/18/14 Psychiatric Admission Assessment Note:   Faxed to:  08/18/14 Suicide Risk Assessment - Discharge Assessment:   Faxed to:  08/18/14 Faxed/Sent to the Next Level Care provider:  08/18/14 Faxed to [facility] @ [phone] No documentation was faxed to [facility] for HBIPS.  Per the SW the patient declined to have records sent.  Patsey Berthold, 08/18/2014, 2:20 PM

## 2015-03-28 ENCOUNTER — Encounter: Payer: Self-pay | Admitting: Gastroenterology

## 2016-08-20 ENCOUNTER — Inpatient Hospital Stay (HOSPITAL_COMMUNITY)
Admission: EM | Admit: 2016-08-20 | Discharge: 2016-08-29 | DRG: 286 | Disposition: A | Discharge status: Home / Self Care | Payer: Self-pay | Attending: Internal Medicine | Admitting: Internal Medicine

## 2016-08-20 ENCOUNTER — Emergency Department (HOSPITAL_COMMUNITY): Payer: Self-pay

## 2016-08-20 ENCOUNTER — Encounter (HOSPITAL_COMMUNITY): Payer: Self-pay | Admitting: *Deleted

## 2016-08-20 DIAGNOSIS — R0602 Shortness of breath: Secondary | ICD-10-CM

## 2016-08-20 DIAGNOSIS — I340 Nonrheumatic mitral (valve) insufficiency: Secondary | ICD-10-CM | POA: Diagnosis present

## 2016-08-20 DIAGNOSIS — F1010 Alcohol abuse, uncomplicated: Secondary | ICD-10-CM | POA: Diagnosis present

## 2016-08-20 DIAGNOSIS — E876 Hypokalemia: Secondary | ICD-10-CM | POA: Diagnosis present

## 2016-08-20 DIAGNOSIS — F1410 Cocaine abuse, uncomplicated: Secondary | ICD-10-CM | POA: Diagnosis present

## 2016-08-20 DIAGNOSIS — I10 Essential (primary) hypertension: Secondary | ICD-10-CM | POA: Diagnosis present

## 2016-08-20 DIAGNOSIS — J189 Pneumonia, unspecified organism: Secondary | ICD-10-CM | POA: Diagnosis present

## 2016-08-20 DIAGNOSIS — E785 Hyperlipidemia, unspecified: Secondary | ICD-10-CM | POA: Diagnosis present

## 2016-08-20 DIAGNOSIS — Z833 Family history of diabetes mellitus: Secondary | ICD-10-CM

## 2016-08-20 DIAGNOSIS — Z8249 Family history of ischemic heart disease and other diseases of the circulatory system: Secondary | ICD-10-CM

## 2016-08-20 DIAGNOSIS — R7989 Other specified abnormal findings of blood chemistry: Secondary | ICD-10-CM | POA: Diagnosis present

## 2016-08-20 DIAGNOSIS — Z789 Other specified health status: Secondary | ICD-10-CM | POA: Diagnosis present

## 2016-08-20 DIAGNOSIS — I248 Other forms of acute ischemic heart disease: Secondary | ICD-10-CM | POA: Diagnosis present

## 2016-08-20 DIAGNOSIS — J9601 Acute respiratory failure with hypoxia: Secondary | ICD-10-CM | POA: Diagnosis present

## 2016-08-20 DIAGNOSIS — J90 Pleural effusion, not elsewhere classified: Secondary | ICD-10-CM | POA: Diagnosis present

## 2016-08-20 DIAGNOSIS — Z86010 Personal history of colonic polyps: Secondary | ICD-10-CM

## 2016-08-20 DIAGNOSIS — R778 Other specified abnormalities of plasma proteins: Secondary | ICD-10-CM | POA: Diagnosis present

## 2016-08-20 DIAGNOSIS — I5043 Acute on chronic combined systolic (congestive) and diastolic (congestive) heart failure: Secondary | ICD-10-CM | POA: Diagnosis present

## 2016-08-20 DIAGNOSIS — J181 Lobar pneumonia, unspecified organism: Secondary | ICD-10-CM

## 2016-08-20 DIAGNOSIS — Z79899 Other long term (current) drug therapy: Secondary | ICD-10-CM

## 2016-08-20 DIAGNOSIS — Z9119 Patient's noncompliance with other medical treatment and regimen: Secondary | ICD-10-CM

## 2016-08-20 DIAGNOSIS — Z7289 Other problems related to lifestyle: Secondary | ICD-10-CM | POA: Diagnosis present

## 2016-08-20 DIAGNOSIS — I700 Atherosclerosis of aorta: Secondary | ICD-10-CM | POA: Diagnosis present

## 2016-08-20 DIAGNOSIS — F332 Major depressive disorder, recurrent severe without psychotic features: Secondary | ICD-10-CM | POA: Diagnosis present

## 2016-08-20 DIAGNOSIS — I429 Cardiomyopathy, unspecified: Secondary | ICD-10-CM | POA: Diagnosis present

## 2016-08-20 DIAGNOSIS — R079 Chest pain, unspecified: Secondary | ICD-10-CM | POA: Diagnosis present

## 2016-08-20 DIAGNOSIS — D7589 Other specified diseases of blood and blood-forming organs: Secondary | ICD-10-CM | POA: Diagnosis present

## 2016-08-20 DIAGNOSIS — I110 Hypertensive heart disease with heart failure: Principal | ICD-10-CM | POA: Diagnosis present

## 2016-08-20 DIAGNOSIS — Z9114 Patient's other noncompliance with medication regimen: Secondary | ICD-10-CM

## 2016-08-20 DIAGNOSIS — E872 Acidosis: Secondary | ICD-10-CM | POA: Diagnosis present

## 2016-08-20 DIAGNOSIS — F1090 Alcohol use, unspecified, uncomplicated: Secondary | ICD-10-CM | POA: Diagnosis present

## 2016-08-20 DIAGNOSIS — R000 Tachycardia, unspecified: Secondary | ICD-10-CM | POA: Diagnosis present

## 2016-08-20 HISTORY — DX: Benign neoplasm of colon, unspecified: D12.6

## 2016-08-20 LAB — BASIC METABOLIC PANEL
Anion gap: 11 (ref 5–15)
BUN: 6 mg/dL (ref 6–20)
CALCIUM: 9 mg/dL (ref 8.9–10.3)
CO2: 21 mmol/L — ABNORMAL LOW (ref 22–32)
Chloride: 108 mmol/L (ref 101–111)
Creatinine, Ser: 0.96 mg/dL (ref 0.61–1.24)
GFR calc non Af Amer: >60 mL/min (ref >60)
GLUCOSE: 108 mg/dL — AB (ref 65–99)
Potassium: 3.8 mmol/L (ref 3.5–5.1)
Sodium: 140 mmol/L (ref 135–145)

## 2016-08-20 LAB — CBC
HCT: 43.4 % (ref 39.0–52.0)
Hemoglobin: 14.7 g/dL (ref 13.0–17.0)
MCH: 34.8 pg — ABNORMAL HIGH (ref 26.0–34.0)
MCHC: 33.9 g/dL (ref 30.0–36.0)
MCV: 102.6 fL — ABNORMAL HIGH (ref 78.0–100.0)
PLATELETS: 297 10*3/uL (ref 150–400)
RBC: 4.23 MIL/uL (ref 4.22–5.81)
RDW: 12.7 % (ref 11.5–15.5)
WBC: 6.3 10*3/uL (ref 4.0–10.5)

## 2016-08-20 LAB — TROPONIN I: TROPONIN I: 0.07 ng/mL — AB (ref <0.03)

## 2016-08-20 LAB — I-STAT TROPONIN, ED: Troponin i, poc: 0.05 ng/mL (ref 0.00–0.08)

## 2016-08-20 LAB — PROTIME-INR
INR: 1.34
PROTHROMBIN TIME: 16.7 s — AB (ref 11.4–15.2)

## 2016-08-20 LAB — LACTIC ACID, PLASMA: Lactic Acid, Venous: 4.7 mmol/L (ref 0.5–1.9)

## 2016-08-20 LAB — D-DIMER, QUANTITATIVE: D-Dimer, Quant: 1.85 ug/mL-FEU — ABNORMAL HIGH (ref 0.00–0.50)

## 2016-08-20 LAB — APTT: APTT: 30 s (ref 24–36)

## 2016-08-20 LAB — BRAIN NATRIURETIC PEPTIDE: B Natriuretic Peptide: 1729.9 pg/mL — ABNORMAL HIGH (ref 0.0–100.0)

## 2016-08-20 MED ORDER — IPRATROPIUM BROMIDE 0.02 % IN SOLN: 0.5000 mg | RESPIRATORY_TRACT | Status: DC

## 2016-08-20 MED ORDER — ONDANSETRON HCL 4 MG/2ML IJ SOLN: 4.0000 mg | Freq: Three times a day (TID) | INTRAMUSCULAR | Status: DC | PRN

## 2016-08-20 MED ORDER — IPRATROPIUM-ALBUTEROL 0.5-2.5 (3) MG/3ML IN SOLN
3.0000 mL | Freq: Once | RESPIRATORY_TRACT | Status: AC
  Administered 2016-08-20: 3 mL via RESPIRATORY_TRACT
  Filled 2016-08-20: qty 3

## 2016-08-20 MED ORDER — SODIUM CHLORIDE 0.9 % IV BOLUS (SEPSIS)
1000.0000 mL | Freq: Once | INTRAVENOUS | Status: AC
  Administered 2016-08-20: 1000 mL via INTRAVENOUS

## 2016-08-20 MED ORDER — DEXTROSE 5 % IV SOLN
500.0000 mg | INTRAVENOUS | Status: DC
  Administered 2016-08-20 – 2016-08-21 (×2): 500 mg via INTRAVENOUS
  Filled 2016-08-20 (×3): qty 500

## 2016-08-20 MED ORDER — LORAZEPAM 2 MG/ML IJ SOLN: 0.0000 mg | Freq: Four times a day (QID) | INTRAMUSCULAR | Status: DC

## 2016-08-20 MED ORDER — LORAZEPAM 2 MG/ML IJ SOLN: 1.0000 mg | Freq: Four times a day (QID) | INTRAMUSCULAR | Status: AC | PRN

## 2016-08-20 MED ORDER — LORAZEPAM 2 MG/ML IJ SOLN: 0.0000 mg | Freq: Two times a day (BID) | INTRAMUSCULAR | Status: DC

## 2016-08-20 MED ORDER — LORAZEPAM 1 MG PO TABS
1.0000 mg | ORAL_TABLET | Freq: Four times a day (QID) | ORAL | Status: AC | PRN
  Administered 2016-08-21 – 2016-08-22 (×2): 1 mg via ORAL
  Filled 2016-08-20 (×2): qty 1

## 2016-08-20 MED ORDER — DM-GUAIFENESIN ER 30-600 MG PO TB12
1.0000 | ORAL_TABLET | Freq: Two times a day (BID) | ORAL | Status: DC
  Administered 2016-08-21 – 2016-08-29 (×17): 1 via ORAL
  Filled 2016-08-20 (×18): qty 1

## 2016-08-20 MED ORDER — ADULT MULTIVITAMIN W/MINERALS CH
1.0000 | ORAL_TABLET | Freq: Every day | ORAL | Status: DC
  Administered 2016-08-21 – 2016-08-29 (×8): 1 via ORAL
  Filled 2016-08-20 (×8): qty 1

## 2016-08-20 MED ORDER — ASPIRIN 325 MG PO TABS
325.0000 mg | ORAL_TABLET | Freq: Every day | ORAL | Status: DC
  Administered 2016-08-21 – 2016-08-26 (×6): 325 mg via ORAL
  Filled 2016-08-20 (×6): qty 1

## 2016-08-20 MED ORDER — AMLODIPINE BESYLATE 5 MG PO TABS
5.0000 mg | ORAL_TABLET | Freq: Every day | ORAL | Status: DC
  Administered 2016-08-20 – 2016-08-24 (×5): 5 mg via ORAL
  Filled 2016-08-20 (×5): qty 1

## 2016-08-20 MED ORDER — SODIUM CHLORIDE 0.9 % IV SOLN
INTRAVENOUS | Status: DC
  Administered 2016-08-20: 75 mL/h via INTRAVENOUS

## 2016-08-20 MED ORDER — AZITHROMYCIN 250 MG PO TABS
500.0000 mg | ORAL_TABLET | Freq: Once | ORAL | Status: AC
  Administered 2016-08-20: 500 mg via ORAL
  Filled 2016-08-20: qty 2

## 2016-08-20 MED ORDER — OXYCODONE-ACETAMINOPHEN 5-325 MG PO TABS
1.0000 | ORAL_TABLET | ORAL | Status: DC | PRN
  Administered 2016-08-21 – 2016-08-26 (×8): 1 via ORAL
  Filled 2016-08-20 (×8): qty 1

## 2016-08-20 MED ORDER — DEXAMETHASONE SODIUM PHOSPHATE 10 MG/ML IJ SOLN: 10.0000 mg | Freq: Once | INTRAMUSCULAR | Status: DC

## 2016-08-20 MED ORDER — IOPAMIDOL (ISOVUE-370) INJECTION 76%
INTRAVENOUS | Status: AC
  Administered 2016-08-20: 100 mL
  Filled 2016-08-20: qty 100

## 2016-08-20 MED ORDER — DEXAMETHASONE SODIUM PHOSPHATE 10 MG/ML IJ SOLN
10.0000 mg | Freq: Once | INTRAMUSCULAR | Status: AC
  Administered 2016-08-20: 10 mg via INTRAVENOUS
  Filled 2016-08-20: qty 1

## 2016-08-20 MED ORDER — METRONIDAZOLE 500 MG PO TABS
500.0000 mg | ORAL_TABLET | Freq: Three times a day (TID) | ORAL | Status: DC
  Administered 2016-08-21 – 2016-08-22 (×7): 500 mg via ORAL
  Filled 2016-08-20 (×7): qty 1

## 2016-08-20 MED ORDER — ENOXAPARIN SODIUM 40 MG/0.4ML ~~LOC~~ SOLN
40.0000 mg | Freq: Every day | SUBCUTANEOUS | Status: DC
  Administered 2016-08-21 – 2016-08-29 (×8): 40 mg via SUBCUTANEOUS
  Filled 2016-08-20 (×8): qty 0.4

## 2016-08-20 MED ORDER — KETOROLAC TROMETHAMINE 30 MG/ML IJ SOLN
30.0000 mg | Freq: Once | INTRAMUSCULAR | Status: AC
  Administered 2016-08-20: 30 mg via INTRAVENOUS
  Filled 2016-08-20: qty 1

## 2016-08-20 MED ORDER — VITAMIN B-1 100 MG PO TABS
100.0000 mg | ORAL_TABLET | Freq: Every day | ORAL | Status: DC
  Administered 2016-08-21 – 2016-08-29 (×8): 100 mg via ORAL
  Filled 2016-08-20 (×8): qty 1

## 2016-08-20 MED ORDER — DEXTROSE 5 % IV SOLN
1.0000 g | INTRAVENOUS | Status: AC
  Administered 2016-08-20 – 2016-08-26 (×7): 1 g via INTRAVENOUS
  Filled 2016-08-20 (×9): qty 10

## 2016-08-20 MED ORDER — HYDRALAZINE HCL 20 MG/ML IJ SOLN: 5.0000 mg | INTRAMUSCULAR | Status: DC | PRN

## 2016-08-20 MED ORDER — THIAMINE HCL 100 MG/ML IJ SOLN: 100.0000 mg | Freq: Every day | INTRAMUSCULAR | Status: DC

## 2016-08-20 MED ORDER — NITROGLYCERIN 0.4 MG SL SUBL: 0.4000 mg | SUBLINGUAL_TABLET | SUBLINGUAL | Status: DC | PRN

## 2016-08-20 MED ORDER — IPRATROPIUM BROMIDE 0.02 % IN SOLN
0.5000 mg | Freq: Four times a day (QID) | RESPIRATORY_TRACT | Status: DC
  Administered 2016-08-21 – 2016-08-22 (×8): 0.5 mg via RESPIRATORY_TRACT
  Filled 2016-08-20 (×8): qty 2.5

## 2016-08-20 MED ORDER — FOLIC ACID 1 MG PO TABS
1.0000 mg | ORAL_TABLET | Freq: Every day | ORAL | Status: DC
  Administered 2016-08-21 – 2016-08-29 (×8): 1 mg via ORAL
  Filled 2016-08-20 (×8): qty 1

## 2016-08-20 MED ORDER — DEXTROSE 5 % IV SOLN
1.0000 g | Freq: Once | INTRAVENOUS | Status: AC
  Administered 2016-08-20: 1 g via INTRAVENOUS
  Filled 2016-08-20: qty 10

## 2016-08-20 MED ORDER — ATENOLOL 50 MG PO TABS: 50.0000 mg | ORAL_TABLET | Freq: Every day | ORAL | Status: DC

## 2016-08-20 MED ORDER — LEVALBUTEROL HCL 1.25 MG/0.5ML IN NEBU
1.2500 mg | INHALATION_SOLUTION | Freq: Four times a day (QID) | RESPIRATORY_TRACT | Status: DC
  Administered 2016-08-20: 1.25 mg via RESPIRATORY_TRACT
  Filled 2016-08-20 (×3): qty 0.5

## 2016-08-20 NOTE — ED Provider Notes (Signed)
[facility] DEPT Provider Note   CSN: [id number] Arrival date & time: 08/20/16  1119     History   Chief Complaint Chief Complaint  Patient presents with  . Chest Pain    HPI [name] [name] is a 46 y.o. male.  HPI   [name] [name] is a 46 y.o. male, with a history of HTN, presenting to the ED with productive cough with yellow sputum accompanied by chest tightness and shortness of breath increasing over the past week. Endorses nausea with occasional vomiting and diarrhea. Denies SOB at rest, Fevers/chills, or any other complaints. Denies history of DVT/PE, recent immobilization, surgery, trauma, or leg swelling.    Past Medical History:  Diagnosis Date  . Hypertension     Patient Active Problem List   Diagnosis Date Noted  . Major depressive disorder, single episode, severe without psychotic features ([place])   . Depression, major, single episode, severe ([place]) 08/15/2014  . Major depressive disorder, recurrent severe without psychotic features ([place]) 08/13/2014  . Suicidal ideation 08/13/2014  . COLONIC POLYPS, BENIGN 01/20/2010  . VITAMIN D DEFICIENCY 09/15/2009  . BLOOD CHEMISTRY, ABNORMAL 09/15/2009  . LIVER FUNCTION TESTS, ABNORMAL, HX OF 09/15/2009  . LUMBAGO 04/25/2009  . 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 HYPERTENSION 04/20/2008    History reviewed. No pertinent surgical history.     Home Medications    Prior to Admission medications   Medication Sig Start Date End Date Taking? Authorizing Provider  atenolol (TENORMIN) 50 MG tablet Take 1 tablet (50 mg total) by mouth daily. Patient not taking: Reported on 08/20/2016 08/16/14   Niel Hummer, NP  FLUoxetine (PROZAC) 20 MG capsule Take 1 capsule (20 mg total) by mouth daily. Patient not taking: Reported on 08/20/2016 08/16/14   Niel Hummer, NP  folic acid (FOLVITE) 1 MG tablet Take 1 tablet (1 mg total) by mouth daily. Patient not taking: Reported on 08/20/2016 08/16/14   Niel Hummer, NP  Multiple Vitamin  (MULTIVITAMIN WITH MINERALS) TABS tablet Take 1 tablet by mouth daily. Patient not taking: Reported on 08/20/2016 08/16/14   Niel Hummer, NP  predniSONE (STERAPRED UNI-PAK) 10 MG tablet Take by mouth taper from 4 doses each day to 1 dose and stop. Take one tablet daily until finished. Patient not taking: Reported on 08/20/2016 08/17/14   Niel Hummer, NP  traZODone (DESYREL) 50 MG tablet Take 1 tablet (50 mg total) by mouth at bedtime as needed and may repeat dose one time if needed for sleep. Patient not taking: Reported on 08/20/2016 08/16/14   Niel Hummer, NP    Family History Family History  Problem Relation Age of Onset  . Diabetes Other   . Hypertension Other     Social History Social History  Substance Use Topics  . Smoking status: Never Smoker  . Smokeless tobacco: Never Used  . Alcohol use Yes     Comment: (2) 24 oz beers every other day pt reports     Allergies   Patient has no known allergies.   Review of Systems Review of Systems  Constitutional: Negative for chills, diaphoresis and fever.  Respiratory: Positive for cough and shortness of breath.   Cardiovascular: Positive for chest pain. Negative for palpitations and leg swelling.  Gastrointestinal: Positive for diarrhea, nausea and vomiting. Negative for abdominal pain.  Neurological: Negative for dizziness, weakness, light-headedness, numbness and headaches.  All other systems reviewed and are negative.    Physical Exam Updated Vital Signs BP (!) 140/118 (  BP Location: Right Arm)   Pulse (!) 115   Temp 97.7 F (36.5 C) (Oral)   Resp 14   Ht 6\' 5"  (1.956 m)   Wt 88.5 kg   SpO2 98%   BMI 23.12 kg/m   Physical Exam  Constitutional: He appears well-developed and well-nourished. No distress.  HENT:  Head: Normocephalic and atraumatic.  Eyes: Conjunctivae are normal.  Neck: Neck supple.  Cardiovascular: Regular rhythm, normal heart sounds and intact distal pulses.  Tachycardia present.     Pulmonary/Chest: Effort normal and breath sounds normal. No respiratory distress.  No increased work of breathing. Patient speaks in full sentences without difficulty.  Abdominal: Soft. There is no tenderness. There is no guarding.  Musculoskeletal: He exhibits no edema.  Lymphadenopathy:    He has no cervical adenopathy.  Neurological: He is alert.  Skin: Skin is warm and dry. He is not diaphoretic.  Psychiatric: He has a normal mood and affect. His behavior is normal.  Nursing note and vitals reviewed.    ED Treatments / Results  Labs (all labs ordered are listed, but only abnormal results are displayed) Labs Reviewed  BASIC METABOLIC PANEL - Abnormal; Notable for the following:       Result Value   CO2 21 (*)    Glucose, Bld 108 (*)    All other components within normal limits  CBC - Abnormal; Notable for the following:    MCV 102.6 (*)    MCH 34.8 (*)    All other components within normal limits  D-DIMER, QUANTITATIVE (NOT AT [facility]) - Abnormal; Notable for the following:    D-Dimer, Quant 1.85 (*)    All other components within normal limits  I-STAT TROPOININ, ED    EKG  EKG Interpretation None       Radiology Dg Chest 2 View  Result Date: 08/20/2016 CLINICAL DATA:  Increase shortness of breath and chest tightness over the past week EXAM: CHEST  2 VIEW COMPARISON:  None in PACs FINDINGS: The lungs are well-expanded. The interstitial markings are coarse. Patchy alveolar opacity is present in the right lower lobe. There is no pleural effusion. The heart and pulmonary vascularity are normal. The trachea is midline. The mediastinum is normal in width. The bony thorax is unremarkable. IMPRESSION: Patchy right lower lobe pneumonia. Bronchitic changes bilaterally. Followup PA and lateral chest X-ray is recommended in 3-4 weeks following trial of antibiotic therapy to ensure resolution and exclude underlying malignancy. Electronically Signed   By: David  Martinique M.D.   On:  08/20/2016 11:50    Procedures Procedures (including critical care time)  Medications Ordered in ED Medications  sodium chloride 0.9 % bolus 1,000 mL (not administered)  ipratropium-albuterol (DUONEB) 0.5-2.5 (3) MG/3ML nebulizer solution 3 mL (not administered)  ipratropium-albuterol (DUONEB) 0.5-2.5 (3) MG/3ML nebulizer solution 3 mL (3 mLs Nebulization Given 08/20/16 1340)  sodium chloride 0.9 % bolus 1,000 mL (0 mLs Intravenous Stopped 08/20/16 1533)  dexamethasone (DECADRON) injection 10 mg (10 mg Intravenous Given 08/20/16 1346)  ketorolac (TORADOL) 30 MG/ML injection 30 mg (30 mg Intravenous Given 08/20/16 1344)  cefTRIAXone (ROCEPHIN) 1 g in dextrose 5 % 50 mL IVPB (0 g Intravenous Stopped 08/20/16 1548)  azithromycin (ZITHROMAX) tablet 500 mg (500 mg Oral Given 08/20/16 1518)  iopamidol (ISOVUE-370) 76 % injection (100 mLs  Contrast Given 08/20/16 1614)     Initial Impression / Assessment and Plan / ED Course  I have reviewed the triage vital signs and the nursing notes.  Pertinent  labs & imaging results that were available during my care of the patient were reviewed by me and considered in my medical decision making (see chart for details).     Patient presents with cough and shortness of breath. He is tachycardic and has faint signs of possible pneumonia in the right lower lobe, however, he has no leukocytosis or fever. Patient is low risk on Well's criteria, but can not PERC. D-dimer positive. Patient voiced improvement in his chest tightness and SOB following DuoNeb. Readily able to drink fluids.   Findings and plan of care discussed with Julianne Rice, MD. Dr. Lita Mains personally evaluated and examined this patient.  End of shift patient care handoff report given to Irena Cords, PA-C. Plan: CT PE study pending. If normal, ambulate patient with SPO2. Treat for pneumonia.    Vitals:   08/20/16 1230 08/20/16 1245 08/20/16 1315 08/20/16 1345  BP: (!) 140/118 (!) 143/113 (!)  141/106 (!) 134/107  Pulse: (!) 115 (!) 116 (!) 114 (!) 113  Resp: 14 14 (!) 23 12  Temp:      TempSrc:      SpO2: 98% 96% 98% 100%  Weight:      Height:         Final Clinical Impressions(s) / ED Diagnoses   Final diagnoses:  None    New Prescriptions New Prescriptions   No medications on file     Layla Maw 08/20/16 1627    Julianne Rice, MD 08/22/16 1902

## 2016-08-20 NOTE — ED Notes (Signed)
Pt ambulated through the hallway and reported to become SOB. Pt's HR 124 and oxygen noted to be 96%.

## 2016-08-20 NOTE — ED Notes (Signed)
PA Shawn at the bedside

## 2016-08-20 NOTE — ED Triage Notes (Signed)
To ED via POV for eval of sob and chest tightness for the past week. States he is unable to walk only a few feet without 'loosing my breath'. Able to talk in complete sentences

## 2016-08-20 NOTE — ED Notes (Signed)
Attempted report x1. 

## 2016-08-20 NOTE — ED Notes (Signed)
Patient taken to CT.

## 2016-08-20 NOTE — ED Notes (Signed)
[place] at the bedside

## 2016-08-20 NOTE — H&P (Addendum)
History and Physical    [name] [name] TDV:[id number] DOB: [birth year] DOA: 08/20/2016  Referring MD/NP/PA:   PCP: No PCP Per Patient   Patient coming from:  The patient is coming from home.  At baseline, pt is independent for most of ADL.   Chief Complaint: Shortness of breath, cough, chest pain  HPI: [name] [name] is a 46 y.o. male with medical history significant of hypertension, depression, cocaine abuse, marijuana abuse, medication noncompliance, who presents with shortness of breath, cough and chest pain.   Patient states that he has been having shortness of breath, cough and chest pain for about 1 week. He coughs up yellow colored sputum. He has chills, but denies fever, denies runny nose or sore throat. He speaks in full sentence. His chest pain is located in the substernal area, constant, 6 out of 10 in severity, tightness-like, nonradiating. Denies tenderness over calf areas. Patient had nausea and vomited twice yesterday, but no nausea, vomiting, diarrhea, abdominal pain today. Denies symptoms of UTI or unilateral weakness. Patient states that he drinks couple of beers every day. He states that he did not take his blood pressure medications for more than a year.   ED Course: pt was found to have  WBC 6.3, electrolytes renal function okay, temperature 97.7, tachycardia, tachypnea, oxygen saturation 90% on RA. Chest x-ray showed patchy infiltration in left lower lobe. D-dimer positive1.85. CT angiogram negative for PE, but showed patchy right lower lobe infiltrate, concerning for pneumonia; small moderate layering bilateral pleural effusions, right greater than left; cardiomegaly with mild underlying pulmonary interstitial edema.  Review of Systems:   General: no fevers, has chills, no changes in body weight, has fatigue HEENT: no blurry vision, hearing changes or sore throat Respiratory: has dyspnea, coughing, no wheezing CV: has chest pain, no palpitations GI: had nausea,  vomiting, no abdominal pain, diarrhea, constipation GU: no dysuria, burning on urination, increased urinary frequency, hematuria  Ext: no leg edema Neuro: no unilateral weakness, numbness, or tingling, no vision change or hearing loss Skin: no rash, no skin tear. MSK: No muscle spasm, no deformity, no limitation of range of movement in spin Heme: No easy bruising.  Travel history: No recent long distant travel.  Allergy: No Known Allergies  Past Medical History:  Diagnosis Date  . Hypertension     Past Surgical History:  Procedure Laterality Date  . POLYPECTOMY      Social History:  reports that he has never smoked. He has never used smokeless tobacco. He reports that he drinks alcohol. He reports that he uses drugs, including Marijuana.  Family History:  Family History  Problem Relation Age of Onset  . Diabetes Other   . Hypertension Other      Prior to Admission medications   Medication Sig Start Date End Date Taking? Authorizing Provider  atenolol (TENORMIN) 50 MG tablet Take 1 tablet (50 mg total) by mouth daily. Patient not taking: Reported on 08/20/2016 08/16/14   Niel Hummer, NP  FLUoxetine (PROZAC) 20 MG capsule Take 1 capsule (20 mg total) by mouth daily. Patient not taking: Reported on 08/20/2016 08/16/14   Niel Hummer, NP  folic acid (FOLVITE) 1 MG tablet Take 1 tablet (1 mg total) by mouth daily. Patient not taking: Reported on 08/20/2016 08/16/14   Niel Hummer, NP  Multiple Vitamin (MULTIVITAMIN WITH MINERALS) TABS tablet Take 1 tablet by mouth daily. Patient not taking: Reported on 08/20/2016 08/16/14   Niel Hummer, NP  predniSONE (STERAPRED UNI-PAK)  10 MG tablet Take by mouth taper from 4 doses each day to 1 dose and stop. Take one tablet daily until finished. Patient not taking: Reported on 08/20/2016 08/17/14   Niel Hummer, NP  traZODone (DESYREL) 50 MG tablet Take 1 tablet (50 mg total) by mouth at bedtime as needed and may repeat dose one time if needed for  sleep. Patient not taking: Reported on 08/20/2016 08/16/14   Niel Hummer, NP    Physical Exam: Vitals:   08/20/16 2130 08/20/16 2230 08/20/16 2350 08/21/16 0210  BP: (!) 145/106 124/85    Pulse: (!) 114 (!) 108    Resp: (!) 26 (!) 26    Temp:      TempSrc:      SpO2: 99% 98%  98%  Weight:   94 kg (207 lb 3.7 oz)   Height:   6\' 5"  (1.956 m)    General: Not in acute distress HEENT:       Eyes: PERRL, EOMI, no scleral icterus.       ENT: No discharge from the ears and nose, no pharynx injection, no tonsillar enlargement.        Neck: No JVD, no bruit, no mass felt. Heme: No neck lymph node enlargement. Cardiac: S1/S2, RRR, No murmurs, No gallops or rubs. Respiratory: No rales, wheezing, rhonchi or rubs. GI: Soft, nondistended, nontender, no rebound pain, no organomegaly, BS present. GU: No hematuria Ext: No pitting leg edema bilaterally. 2+DP/PT pulse bilaterally. Musculoskeletal: No joint deformities, No joint redness or warmth, no limitation of ROM in spin. Skin: No rashes.  Neuro: Alert, oriented X3, cranial nerves II-XII grossly intact, moves all extremities normally.  Psych: Patient is not psychotic, no suicidal or hemocidal ideation.  Labs on Admission: I have personally reviewed following labs and imaging studies  CBC:  Recent Labs Lab 08/20/16 1131  WBC 6.3  HGB 14.7  HCT 43.4  MCV 102.6*  PLT 009   Basic Metabolic Panel:  Recent Labs Lab 08/20/16 1131  NA 140  K 3.8  CL 108  CO2 21*  GLUCOSE 108*  BUN 6  CREATININE 0.96  CALCIUM 9.0   GFR: Estimated Creatinine Clearance: 119.9 mL/min (by C-G formula based on SCr of 0.96 mg/dL). Liver Function Tests: No results for input(s): AST, ALT, ALKPHOS, BILITOT, PROT, ALBUMIN in the last 168 hours. No results for input(s): LIPASE, AMYLASE in the last 168 hours. No results for input(s): AMMONIA in the last 168 hours. Coagulation Profile:  Recent Labs Lab 08/20/16 2308  INR 1.34   Cardiac  Enzymes:  Recent Labs Lab 08/20/16 1931 08/21/16 0150  TROPONINI 0.07* 0.06*   BNP (last 3 results) No results for input(s): PROBNP in the last 8760 hours. HbA1C: No results for input(s): HGBA1C in the last 72 hours. CBG: No results for input(s): GLUCAP in the last 168 hours. Lipid Profile:  Recent Labs  08/21/16 0150  CHOL 159  HDL 41  LDLCALC 102*  TRIG 81  CHOLHDL 3.9   Thyroid Function Tests: No results for input(s): TSH, T4TOTAL, FREET4, T3FREE, THYROIDAB in the last 72 hours. Anemia Panel: No results for input(s): VITAMINB12, FOLATE, FERRITIN, TIBC, IRON, RETICCTPCT in the last 72 hours. Urine analysis:    Component Value Date/Time   COLORURINE YELLOW 10/11/2013 [place] 10/11/2013 1416   LABSPEC 1.017 10/11/2013 1416   PHURINE 5.0 10/11/2013 1416   GLUCOSEU NEGATIVE 10/11/2013 1416   [place] 10/11/2013 1416   HGBUR negative 09/05/2009 0900  BILIRUBINUR NEGATIVE 10/11/2013 [place] 10/11/2013 1416   PROTEINUR NEGATIVE 10/11/2013 1416   UROBILINOGEN 0.2 10/11/2013 1416   NITRITE NEGATIVE 10/11/2013 1416   LEUKOCYTESUR NEGATIVE 10/11/2013 1416   Sepsis Labs: @LABRCNTIP (procalcitonin:4,lacticidven:4) )No results found for this or any previous visit (from the past 240 hour(s)).   Radiological Exams on Admission: Dg Chest 2 View  Result Date: 08/20/2016 CLINICAL DATA:  Increase shortness of breath and chest tightness over the past week EXAM: CHEST  2 VIEW COMPARISON:  None in PACs FINDINGS: The lungs are well-expanded. The interstitial markings are coarse. Patchy alveolar opacity is present in the right lower lobe. There is no pleural effusion. The heart and pulmonary vascularity are normal. The trachea is midline. The mediastinum is normal in width. The bony thorax is unremarkable. IMPRESSION: Patchy right lower lobe pneumonia. Bronchitic changes bilaterally. Followup PA and lateral chest X-ray is recommended in 3-4 weeks  following trial of antibiotic therapy to ensure resolution and exclude underlying malignancy. Electronically Signed   By: David  Martinique M.D.   On: 08/20/2016 11:50   Ct Angio Chest Pe W Or Wo Contrast  Result Date: 08/20/2016 CLINICAL DATA:  Initial evaluation for acute shortness of breath, tachycardia, chest pain. EXAM: CT ANGIOGRAPHY CHEST WITH CONTRAST TECHNIQUE: Multidetector CT imaging of the chest was performed using the standard protocol during bolus administration of intravenous contrast. Multiplanar CT image reconstructions and MIPs were obtained to evaluate the vascular anatomy. COMPARISON:  Prior radiograph from earlier the same day. FINDINGS: Cardiovascular: Intrathoracic aorta of normal caliber without acute abnormality. Partially visualized great vessels grossly unremarkable. Cardiomegaly.  No pericardial effusion. Pulmonary arterial tree adequately opacified for evaluation. Main pulmonary artery within normal limits for size measuring 2.9 cm in diameter. The the the no filling defect to suggest acute pulmonary embolism identified. Re-formatted imaging confirms these findings. Mediastinum/Nodes: Partially visualized thyroid is unremarkable. Scattered mediastinal lymph nodes measuring up to 15 mm present (series 5, image 37), indeterminate, but may be reactive. No appreciable hilar adenopathy. No enlarged axillary nodes. Esophagus within normal limits. Lungs/Pleura: The layering bilateral pleural effusions, right greater than left. Scattered interlobular septal thickening suggests mild pulmonary interstitial edema. Scattered ground-glass and patchy opacities within the right lower lobe, somewhat concerning for pneumonia. No other focal infiltrates. No pneumothorax. No worrisome pulmonary nodule or mass. Upper Abdomen: Peripherally calcified cyst noted arising from the upper pole left kidney, not well evaluated on this examination. Remainder the visualized upper abdomen otherwise unremarkable.  Musculoskeletal: No acute osseous abnormality. No worrisome lytic or blastic osseous lesions. Mild scoliosis noted. Review of the MIP images confirms the above findings. IMPRESSION: 1. No CT evidence for acute pulmonary embolism. 2. Patchy right lower lobe infiltrate, concerning for pneumonia. 3. Small moderate layering bilateral pleural effusions, right greater than left. 4. Cardiomegaly with mild underlying pulmonary interstitial edema. Electronically Signed   By: Jeannine Boga M.D.   On: 08/20/2016 16:56     EKG: Independently reviewed.  Sinus tachycardia, QTC 471, mild T-wave inversion in inferior leads and V4-V6.   Assessment/Plan Principal Problem:   Acute respiratory failure with hypoxia (HCC) Active Problems:   Essential hypertension   Major depressive disorder, recurrent severe without psychotic features ([place])   CAP (community acquired pneumonia)   Chest pain   Alcohol use   Elevated troponin   Sepsis ([place])   Acute respiratory failure with hypoxia and sepsis: Etiology is not clear. D-dimer is positive, but the CT angiogram is negative for PE, but showed small  patchy right lower lobe infiltrate, concerning for pneumonia and small moderate layering bilateral pleural effusions (R>L). CXR also showed patchy infiltration RLL. Pt dose not have leukocytosis and a fever, but has chest pain, productive cough and shortness of breath indicating possible CAP. Pt drinks alcohol regularly, and has RLL infiltration--> cannot completely rule out aspiration pneumonia. Pt meets criteria for sepsis with elevated lactate of 4.7, tachycardia and tachypnea. No leukocytosis or fever currently. Hemodynamically stable.  - Will place on telemetry bed for obs. - IV Rocephin and azithromycin were started in ED, will continue - will add Flagyl - Mucinex for cough  - Atroven nebs and Xopenex Neb prn for SOB - Urine legionella and S. pneumococcal antigen - Follow up blood culture x2, sputum culture and  plus Flu pcr -will get Procalcitonin and trend lactic acid levels per sepsis protocol. -IVF: 2.5L of NS bolus in ED, followed by 75 cc/h   Addendum: pt's BNP comes back eleveted at 1729, indicating possible CHF, difficult situation since pt has sepsis with elevated lactic acid 4.7, which needs IVF resuscitation. -Hold diuretics due to sepsis -Gentle IV fluid: continue NS 75 cc/h -f/u 2d echo.  Chest pain: Etiology is not clear. Likely due to pneumonia. Patient has history of cocaine abuse, marijuana abuse, and elevated blood pressure. He has not been compliant to his blood pressure medications. Demand ischemia is also possible. Cocaine abuse cannot be completely rule out an now. - cycle CE q6 x3 and repeat EKG in the am  - Nitroglycerin, percocet prn for pain - start aspirin - Risk factor stratification: will check FLP, UDS and A1C  - 2d echo  HTN: bp 157/120. Not compliant to blood pressure medications. -hold Atenolol and need to r/o cocaine abuse by UDS -start amlodipine 5 mg daily -IV hydralazine when necessary  Depression: Stable, no suicidal or homicidal ideations. Not taking meds at home now - observe closely  Alcohol use : -Did counseling about the importance of quitting drinking -CIWA protocol   DVT ppx: SQ Lovenox Code Status: Full code Family Communication: Yes, patient's friend at bed side Disposition Plan:  Anticipate discharge back to previous home environment Consults called:  none Admission status: Obs / tele   Date of Service 08/21/2016    Ivor Costa [facility] Pager [phone]  If 7PM-7AM, please contact night-coverage [email or web address] Password [email or web address] 08/21/2016, 3:59 AM

## 2016-08-21 LAB — LIPID PANEL
CHOL/HDL RATIO: 3.9 ratio
Cholesterol: 159 mg/dL (ref 0–200)
HDL: 41 mg/dL (ref >40)
LDL CALC: 102 mg/dL — AB (ref 0–99)
Triglycerides: 81 mg/dL (ref <150)
VLDL: 16 mg/dL (ref 0–40)

## 2016-08-21 LAB — RAPID URINE DRUG SCREEN, HOSP PERFORMED
Amphetamines: NOT DETECTED
BENZODIAZEPINES: NOT DETECTED
Barbiturates: NOT DETECTED
COCAINE: POSITIVE — AB
OPIATES: NOT DETECTED
TETRAHYDROCANNABINOL: POSITIVE — AB

## 2016-08-21 LAB — STREP PNEUMONIAE URINARY ANTIGEN: Strep Pneumo Urinary Antigen: NEGATIVE

## 2016-08-21 LAB — TROPONIN I
TROPONIN I: 0.06 ng/mL — AB (ref <0.03)
TROPONIN I: 0.06 ng/mL — AB (ref <0.03)

## 2016-08-21 LAB — INFLUENZA PANEL BY PCR (TYPE A & B)
INFLAPCR: NEGATIVE
Influenza B By PCR: NEGATIVE

## 2016-08-21 LAB — HIV ANTIBODY (ROUTINE TESTING W REFLEX): HIV Screen 4th Generation wRfx: NONREACTIVE

## 2016-08-21 LAB — MRSA PCR SCREENING

## 2016-08-21 LAB — LACTIC ACID, PLASMA
Lactic Acid, Venous: 3.9 mmol/L (ref 0.5–1.9)
Lactic Acid, Venous: 4.2 mmol/L (ref 0.5–1.9)

## 2016-08-21 LAB — PROCALCITONIN

## 2016-08-21 MED ORDER — LEVALBUTEROL HCL 1.25 MG/0.5ML IN NEBU: 1.2500 mg | INHALATION_SOLUTION | Freq: Four times a day (QID) | RESPIRATORY_TRACT | Status: DC

## 2016-08-21 MED ORDER — SODIUM CHLORIDE 0.9 % IV BOLUS (SEPSIS)
500.0000 mL | Freq: Once | INTRAVENOUS | Status: AC
  Administered 2016-08-21: 500 mL via INTRAVENOUS

## 2016-08-21 MED ORDER — FUROSEMIDE 10 MG/ML IJ SOLN
40.0000 mg | Freq: Once | INTRAMUSCULAR | Status: AC
  Administered 2016-08-21: 40 mg via INTRAVENOUS
  Filled 2016-08-21: qty 4

## 2016-08-21 MED ORDER — LEVALBUTEROL HCL 1.25 MG/0.5ML IN NEBU
1.2500 mg | INHALATION_SOLUTION | Freq: Four times a day (QID) | RESPIRATORY_TRACT | Status: DC
  Administered 2016-08-21 – 2016-08-22 (×8): 1.25 mg via RESPIRATORY_TRACT
  Filled 2016-08-21 (×8): qty 0.5

## 2016-08-21 NOTE — Progress Notes (Signed)
Microbiology called; unable to run MRSA PCR screen; will have to wait on culture x 1-2 days for result.

## 2016-08-21 NOTE — Progress Notes (Signed)
New lactic acid result of 4.2 called by labs at 0240. Hospitalist notified.

## 2016-08-21 NOTE — Progress Notes (Signed)
PROGRESS NOTE  [name] [name]  PFX:[id number] DOB: [birth year] DOA: 08/20/2016 PCP: No PCP Per Patient   Brief Narrative: [name] [name] is a 46 y.o. male with a history of polysubstance abuse, untreated HTN who presented 4/2 with 1 week of gradually worsening dyspnea, and occasionally productive cough associated with pleuritic substernal, moderate, intermittent chest tightness. He was found to haveSpO2 90% on room air, tachypnea, tachycardia, afebrile with WBC 6.3. Flu negative. D-dimer was positive 1.85, and subsequent CTA chest was negative for PE, but showed patchy RLL infiltrate, small-moderate layering bilateral pleural effusions, right greater than left, and cardiomegaly with mild underlying pulmonary interstitial edema. BNP was 1,729.9. Lactic acid was 4.7, minimally responsive to fluids to 4.2. Troponins have been mildly elevated, and flat 0.05 - 0.07. Echocardiogram is ordered. UDS pending.   Assessment & Plan: Principal Problem:   Acute respiratory failure with hypoxia (HCC) Active Problems:   Essential hypertension   Major depressive disorder, recurrent [name] without psychotic features ([place])   CAP (community acquired pneumonia)   Chest pain   Alcohol use   Elevated troponin   Sepsis ([place])  Acute respiratory failure with hypoxia: Suspect volume overload with pleural effusions, longstanding untreated HTN, cocaine use, orthopnea, cardiomegaly, and [name] BNP elevation (1,729.9). Also treating for PNA (CAP vs. aspiration) based on infiltrate seen on CTA chest, though no fever, leukocytosis, and PCT negative. - Suspect lactate elevation not improved with fluids is not due to sepsis, but to hypoperfusion, so will DC IVF, give lasix 40mg  IV x1, start diet, await echo - IV Rocephin and azithromycin were started in ED, flagyl added. Will continue this for now and consider stopping based on further work up/cultures.  - Mucinex for cough  - Atroven nebs and Xopenex Neb prn for SOB  Chest  pain: Atypical, associated with cough only, not exertional. Troponins very mildly elevated without active chest pain. ECG shows ST, LVH and lateral TWI which was not present on only prior available in 2011. Had sinus tachycardia and mild J-point elevation at that time. Suspect demand ischemia with reassuring trend of CE's, cocaine abuse cannot be completely rule out an now. - Repeat ECG, check echocardiogram. Consult cardiology if ischemic changes/WMA's - Nitroglycerin prn chest pain - Aspirin given - Checking FLP, UDS and A1C   HTN: Chronic, untreated due to nonadherence.  - Holding beta blocker pending UDS - Started norvasc 5mg  - Continue hydralazine IV prn.   Depression: Stable, no suicidal or homicidal ideations. Not taking meds at home now - Monitor  Alcohol/polysubstance abuse: - Did counseling about the importance of quitting drinking, denies recent cocaine, marijuana.  - Monitor on CIWA protocol  Macrocytosis: Without anemia, likely related to alcohol abuse.  - Check B12, folate with next blood draw  DVT prophylaxis: Lovenox Code Status: Full Family Communication: Friend at bedside per pt's wishes Disposition Plan: Work up cause of dyspnea, treating pneumonia presumptively. Transfer to floor today.  Consultants:   None  Procedures:   Echocardiogram ordered  Antimicrobials:  Ceftriaxone, azithromycin, flagyl 4/2 >>    Subjective: Pt feels no improvement from admission. Has stable cough sometimes productive of yellow sputum, other times dry, this and dyspnea worse when recumbent. No chest pain at this time. Is dyspneic during conversation without hypoxemia.   Objective: BP (!) 128/98 (BP Location: Left Arm)   Pulse (!) 111   Temp 97.8 F (36.6 C) (Oral)   Resp (!) 34   Ht 6\' 5"  (1.956 m)   Wt 94 kg (  207 lb 3.7 oz)   SpO2 98%   BMI 24.57 kg/m    Intake/Output Summary (Last 24 hours) at 08/21/16 1105 Last data filed at 08/21/16 1048  Gross per 24 hour    Intake           4657.5 ml  Output              450 ml  Net           4207.5 ml   Filed Weights   08/20/16 1125 08/20/16 2350  Weight: 88.5 kg (195 lb) 94 kg (207 lb 3.7 oz)    Examination: General exam: 46 y.o. male in no distress Respiratory system: Labored breathing room air. Diminished at bases bilaterally without wheezes. Cough with any deep inspiration.  Cardiovascular system: Regular tachycardia. No murmur, rub, or gallop. No JVD, and no pedal edema. Gastrointestinal system: Abdomen soft, non-tender, non-distended, with normoactive bowel sounds. No organomegaly or masses felt. Central nervous system: Alert and oriented. No focal neurological deficits. Extremities: Warm, no deformities Skin: No rashes, lesions no ulcers. Diffuse tattoos.  Psychiatry: Judgement and insight appear normal. Mood & affect appropriate.   Data Reviewed: I have personally reviewed following labs and imaging studies  CBC:  Recent Labs Lab 08/20/16 1131  WBC 6.3  HGB 14.7  HCT 43.4  MCV 102.6*  PLT 664   Basic Metabolic Panel:  Recent Labs Lab 08/20/16 1131  NA 140  K 3.8  CL 108  CO2 21*  GLUCOSE 108*  BUN 6  CREATININE 0.96  CALCIUM 9.0   GFR: Estimated Creatinine Clearance: 119.9 mL/min (by C-G formula based on SCr of 0.96 mg/dL).  Coagulation Profile:  Recent Labs Lab 08/20/16 2308  INR 1.34   Cardiac Enzymes:  Recent Labs Lab 08/20/16 1931 08/21/16 0150 08/21/16 0731  TROPONINI 0.07* 0.06* 0.06*   Lipid Profile:  Recent Labs  08/21/16 0150  CHOL 159  HDL 41  LDLCALC 102*  TRIG 81  CHOLHDL 3.9      Radiology Studies: Dg Chest 2 View  Result Date: 08/20/2016 CLINICAL DATA:  Increase shortness of breath and chest tightness over the past week EXAM: CHEST  2 VIEW COMPARISON:  None in PACs FINDINGS: The lungs are well-expanded. The interstitial markings are coarse. Patchy alveolar opacity is present in the right lower lobe. There is no pleural effusion. The  heart and pulmonary vascularity are normal. The trachea is midline. The mediastinum is normal in width. The bony thorax is unremarkable. IMPRESSION: Patchy right lower lobe pneumonia. Bronchitic changes bilaterally. Followup PA and lateral chest X-ray is recommended in 3-4 weeks following trial of antibiotic therapy to ensure resolution and exclude underlying malignancy. Electronically Signed   By: David  Martinique M.D.   On: 08/20/2016 11:50   Ct Angio Chest Pe W Or Wo Contrast  Result Date: 08/20/2016 CLINICAL DATA:  Initial evaluation for acute shortness of breath, tachycardia, chest pain. EXAM: CT ANGIOGRAPHY CHEST WITH CONTRAST TECHNIQUE: Multidetector CT imaging of the chest was performed using the standard protocol during bolus administration of intravenous contrast. Multiplanar CT image reconstructions and MIPs were obtained to evaluate the vascular anatomy. COMPARISON:  Prior radiograph from earlier the same day. FINDINGS: Cardiovascular: Intrathoracic aorta of normal caliber without acute abnormality. Partially visualized great vessels grossly unremarkable. Cardiomegaly.  No pericardial effusion. Pulmonary arterial tree adequately opacified for evaluation. Main pulmonary artery within normal limits for size measuring 2.9 cm in diameter. The the the no filling defect to suggest acute pulmonary embolism  identified. Re-formatted imaging confirms these findings. Mediastinum/Nodes: Partially visualized thyroid is unremarkable. Scattered mediastinal lymph nodes measuring up to 15 mm present (series 5, image 37), indeterminate, but may be reactive. No appreciable hilar adenopathy. No enlarged axillary nodes. Esophagus within normal limits. Lungs/Pleura: The layering bilateral pleural effusions, right greater than left. Scattered interlobular septal thickening suggests mild pulmonary interstitial edema. Scattered ground-glass and patchy opacities within the right lower lobe, somewhat concerning for pneumonia. No  other focal infiltrates. No pneumothorax. No worrisome pulmonary nodule or mass. Upper Abdomen: Peripherally calcified cyst noted arising from the upper pole left kidney, not well evaluated on this examination. Remainder the visualized upper abdomen otherwise unremarkable. Musculoskeletal: No acute osseous abnormality. No worrisome lytic or blastic osseous lesions. Mild scoliosis noted. Review of the MIP images confirms the above findings. IMPRESSION: 1. No CT evidence for acute pulmonary embolism. 2. Patchy right lower lobe infiltrate, concerning for pneumonia. 3. Small moderate layering bilateral pleural effusions, right greater than left. 4. Cardiomegaly with mild underlying pulmonary interstitial edema. Electronically Signed   By: Jeannine Boga M.D.   On: 08/20/2016 16:56   Time spent: 25 minutes.  Vance Gather, MD [facility] Pager [phone]  If 7PM-7AM, please contact night-coverage [email or web address] Password [email or web address] 08/21/2016, 11:05 AM

## 2016-08-21 NOTE — Care Management Note (Signed)
Case Management Note  Patient Details  Name: [name] [name] MRN: [id number] Date of Birth: February 08, 1971  Subjective/Objective:        Pt admitted with CP            Action/Plan:   PTA independent from home alone.  Pt is in agreement with establishing PCP at [facility] - CM informed pt that once appt is made he will be able to utilize pharmacy and request medication assistance.  Pt informed CM that the BP medication that he stopped taking over a year ago was on the $4 list at [place] - couldn't provide a reason as to why he stopped taking it but denied hardship with cost.    CM will attempt to make appt for pt prior to discharge - CM will also provide pt clinic brochure.  Pt may benefit from [facility] letter at discharge.    Expected Discharge Date:                  Expected Discharge Plan:  Home/Self Care  In-House Referral:     Discharge planning Services  CM Consult, [facility], [facility]  Post Acute Care Choice:    Choice offered to:     DME Arranged:    DME Agency:     HH Arranged:    HH Agency:     Status of Service:  In process, will continue to follow  If discussed at Long Length of Stay Meetings, dates discussed:    Additional Comments:  Maryclare Labrador, RN 08/21/2016, 10:44 AM

## 2016-08-22 ENCOUNTER — Inpatient Hospital Stay (HOSPITAL_COMMUNITY): Payer: Self-pay

## 2016-08-22 DIAGNOSIS — R079 Chest pain, unspecified: Secondary | ICD-10-CM

## 2016-08-22 LAB — ECHOCARDIOGRAM COMPLETE
FS: 12 % — AB (ref 28–44)
HEIGHTINCHES: 77 in
IV/PV OW: 0.69
LA ID, A-P, ES: 44 mm
LA diam end sys: 44 mm
LA vol A4C: 100 ml
LA vol: 101 mL
LDCA: 3.46 cm2
LV dias vol: 191 mL — AB (ref 62–150)
LVELAT: 6.47 cm/s
LVOT VTI: 6.55 cm
LVOT diameter: 21 mm
LVOTPV: 56.2 cm/s
LVOTSV: 23 mL
LVSYSVOL: 160 mL — AB (ref 21–61)
MRPISAEROA: 0.2 cm2
PW: 10.6 mm — AB (ref 0.6–1.1)
RV LATERAL S' VELOCITY: 8.38 cm/s
RV TAPSE: 11.2 mm
Simpson's disk: 16
Stroke v: 31 ml
TDI e' lateral: 6.47
TDI e' medial: 4.39
VTI: 108 cm
WEIGHTICAEL: 3259.2 [oz_av]

## 2016-08-22 LAB — CBC
HCT: 42.7 % (ref 39.0–52.0)
Hemoglobin: 14.5 g/dL (ref 13.0–17.0)
MCH: 34.8 pg — AB (ref 26.0–34.0)
MCHC: 34 g/dL (ref 30.0–36.0)
MCV: 102.4 fL — ABNORMAL HIGH (ref 78.0–100.0)
PLATELETS: 274 10*3/uL (ref 150–400)
RBC: 4.17 MIL/uL — AB (ref 4.22–5.81)
RDW: 13.4 % (ref 11.5–15.5)
WBC: 8.4 10*3/uL (ref 4.0–10.5)

## 2016-08-22 LAB — HEMOGLOBIN A1C
HEMOGLOBIN A1C: 4.9 % (ref 4.8–5.6)
MEAN PLASMA GLUCOSE: 94 mg/dL

## 2016-08-22 LAB — COMPREHENSIVE METABOLIC PANEL
ALT: 32 U/L (ref 17–63)
AST: 39 U/L (ref 15–41)
Albumin: 3.2 g/dL — ABNORMAL LOW (ref 3.5–5.0)
Alkaline Phosphatase: 66 U/L (ref 38–126)
Anion gap: 11 (ref 5–15)
BUN: 11 mg/dL (ref 6–20)
CHLORIDE: 105 mmol/L (ref 101–111)
CO2: 21 mmol/L — AB (ref 22–32)
CREATININE: 1.04 mg/dL (ref 0.61–1.24)
Calcium: 8.6 mg/dL — ABNORMAL LOW (ref 8.9–10.3)
GFR calc non Af Amer: >60 mL/min (ref >60)
Glucose, Bld: 105 mg/dL — ABNORMAL HIGH (ref 65–99)
POTASSIUM: 3.6 mmol/L (ref 3.5–5.1)
SODIUM: 137 mmol/L (ref 135–145)
Total Bilirubin: 0.6 mg/dL (ref 0.3–1.2)
Total Protein: 6.2 g/dL — ABNORMAL LOW (ref 6.5–8.1)

## 2016-08-22 LAB — LEGIONELLA PNEUMOPHILA SEROGP 1 UR AG: L. PNEUMOPHILA SEROGP 1 UR AG: NEGATIVE

## 2016-08-22 LAB — VITAMIN B12: VITAMIN B 12: 417 pg/mL (ref 180–914)

## 2016-08-22 LAB — FOLATE: Folate: 8.1 ng/mL (ref >5.9)

## 2016-08-22 MED ORDER — FUROSEMIDE 10 MG/ML IJ SOLN
40.0000 mg | Freq: Once | INTRAMUSCULAR | Status: AC
  Administered 2016-08-22: 40 mg via INTRAVENOUS

## 2016-08-22 MED ORDER — AZITHROMYCIN 500 MG PO TABS
500.0000 mg | ORAL_TABLET | Freq: Every day | ORAL | Status: DC
  Administered 2016-08-22 – 2016-08-26 (×5): 500 mg via ORAL
  Filled 2016-08-22 (×5): qty 1

## 2016-08-22 NOTE — Progress Notes (Addendum)
PROGRESS NOTE  [name] [name]  WCH:[id number] DOB: [birth year] DOA: 08/20/2016 PCP: No PCP Per Patient   Brief Narrative: [name] [name] is a 46 y.o. male with a history of polysubstance abuse, untreated HTN who presented 4/2 with 1 week of gradually worsening dyspnea, and occasionally productive cough associated with pleuritic substernal, moderate, intermittent chest tightness. He was found to have SpO2 90% on room air, tachypnea, tachycardia, afebrile with WBC 6.3. Flu negative. D-dimer was positive 1.85, and subsequent CTA chest was negative for PE, but showed patchy RLL infiltrate, small-moderate layering bilateral pleural effusions, right greater than left, and cardiomegaly with mild underlying pulmonary interstitial edema. BNP was 1,729.9. Lactic acid was 4.7, minimally responsive to fluids to 4.2. Troponins have been mildly elevated, and flat 0.05 - 0.07. Echocardiogram is pending  Assessment & Plan: Principal Problem:   Acute respiratory failure with hypoxia (HCC) Active Problems:   Essential hypertension   Major depressive disorder, recurrent severe without psychotic features ([place])   CAP (community acquired pneumonia)   Chest pain   Alcohol use   Elevated troponin   Sepsis ([place])  Acute respiratory failure with hypoxia:  Due to volume overload with pleural effusions, longstanding untreated HTN, cocaine use BNP elevated (1,729.9) -also treating for PNA (CAP vs. aspiration) based on infiltrate seen on CTA chest, though no fever, leukocytosis, and PCT negative. - Suspect lactate elevation not improved with fluids was not due to sepsis, but to hypoperfusion, IVF d/c'd and give lasix 40mg  IV x1 (+4.2L still)-- will give another lasix -echo pending - IV Rocephin and azithromycin were started in ED, flagyl added. Will continue this for now and consider stopping based on further work up/cultures- strep pna negative, legionella pending - Mucinex for cough  - Atroven nebs and Xopenex Neb prn  for SOB -radiology recommends a follow up chest x ray after treatment for PNA -HIV non-reactive  Chest pain:  -Atypical, associated with cough only, not exertional. Troponins very mildly elevated without active chest pain. ECG shows ST, LVH and lateral TWI which was not present in 2011. Had sinus tachycardia and mild J-point elevation at that time. Suspect demand ischemia due to cocaine abuse - echo pending - Nitroglycerin prn chest pain - Aspirin given   HTN: Chronic, untreated due to nonadherence.  - Holding beta blocker due to cocaine abuse - Started norvasc 5mg  - Continue hydralazine IV prn.   Depression: Stable, no suicidal or homicidal ideations. Not taking meds at home now - Monitor  Alcohol/polysubstance abuse: - Did counseling about the importance of quitting drinking as well as illegal drugs-- says he has used only a few times.  - Monitor on CIWA protocol  Macrocytosis: Without anemia, likely related to alcohol abuse.  - Check B12, folate normal  DVT prophylaxis: Lovenox Code Status: Full Family Communication:  Disposition Plan: await echo  Consultants:   None  Procedures:   Echocardiogram pending  Antimicrobials:  Ceftriaxone, azithromycin, flagyl 4/2 >>    Subjective: Only urinated 1 time after lasix about 1/2 of urinal full  Objective: BP 118/88 (BP Location: Left Arm)   Pulse (!) 109   Temp 98 F (36.7 C)   Resp 18   Ht 6\' 5"  (1.956 m)   Wt 92.4 kg (203 lb 11.2 oz)   SpO2 95%   BMI 24.16 kg/m    Intake/Output Summary (Last 24 hours) at 08/22/16 0759 Last data filed at 08/21/16 2300  Gross per 24 hour  Intake  795 ml  Output              850 ml  Net              -55 ml   Filed Weights   08/20/16 1125 08/20/16 2350 08/22/16 0558  Weight: 88.5 kg (195 lb) 94 kg (207 lb 3.7 oz) 92.4 kg (203 lb 11.2 oz)    Examination: General exam: laying in bed at an angle- NAD Respiratory system: no wheezing, no increase work of  breathing Cardiovascular system:  Tachycardia- sinus. No murmur, rub, or gallop. No JVD, and no pedal edema. Gastrointestinal system: Abdomen soft, non-tender, non-distended, +BS, soft. No organomegaly or masses felt. Central nervous system: Alert and oriented. No focal neurological deficits. Extremities: Warm, no deformities Skin: No rashes, lesions no ulcers. Diffuse tattoos.  Psychiatry: Judgement and insight appear normal. Mood & affect appropriate.   Data Reviewed: I have personally reviewed following labs and imaging studies  CBC:  Recent Labs Lab 08/20/16 1131 08/22/16 0535  WBC 6.3 8.4  HGB 14.7 14.5  HCT 43.4 42.7  MCV 102.6* 102.4*  PLT 297 937   Basic Metabolic Panel:  Recent Labs Lab 08/20/16 1131 08/22/16 0535  NA 140 137  K 3.8 3.6  CL 108 105  CO2 21* 21*  GLUCOSE 108* 105*  BUN 6 11  CREATININE 0.96 1.04  CALCIUM 9.0 8.6*   GFR: Estimated Creatinine Clearance: 110.7 mL/min (by C-G formula based on SCr of 1.04 mg/dL).  Coagulation Profile:  Recent Labs Lab 08/20/16 2308  INR 1.34   Cardiac Enzymes:  Recent Labs Lab 08/20/16 1931 08/21/16 0150 08/21/16 0731  TROPONINI 0.07* 0.06* 0.06*   Lipid Profile:  Recent Labs  08/21/16 0150  CHOL 159  HDL 41  LDLCALC 102*  TRIG 81  CHOLHDL 3.9      Radiology Studies: Dg Chest 2 View  Result Date: 08/20/2016 CLINICAL DATA:  Increase shortness of breath and chest tightness over the past week EXAM: CHEST  2 VIEW COMPARISON:  None in PACs FINDINGS: The lungs are well-expanded. The interstitial markings are coarse. Patchy alveolar opacity is present in the right lower lobe. There is no pleural effusion. The heart and pulmonary vascularity are normal. The trachea is midline. The mediastinum is normal in width. The bony thorax is unremarkable. IMPRESSION: Patchy right lower lobe pneumonia. Bronchitic changes bilaterally. Followup PA and lateral chest X-ray is recommended in 3-4 weeks following trial  of antibiotic therapy to ensure resolution and exclude underlying malignancy. Electronically Signed   By: David  Martinique M.D.   On: 08/20/2016 11:50   Ct Angio Chest Pe W Or Wo Contrast  Result Date: 08/20/2016 CLINICAL DATA:  Initial evaluation for acute shortness of breath, tachycardia, chest pain. EXAM: CT ANGIOGRAPHY CHEST WITH CONTRAST TECHNIQUE: Multidetector CT imaging of the chest was performed using the standard protocol during bolus administration of intravenous contrast. Multiplanar CT image reconstructions and MIPs were obtained to evaluate the vascular anatomy. COMPARISON:  Prior radiograph from earlier the same day. FINDINGS: Cardiovascular: Intrathoracic aorta of normal caliber without acute abnormality. Partially visualized great vessels grossly unremarkable. Cardiomegaly.  No pericardial effusion. Pulmonary arterial tree adequately opacified for evaluation. Main pulmonary artery within normal limits for size measuring 2.9 cm in diameter. The the the no filling defect to suggest acute pulmonary embolism identified. Re-formatted imaging confirms these findings. Mediastinum/Nodes: Partially visualized thyroid is unremarkable. Scattered mediastinal lymph nodes measuring up to 15 mm present (series 5, image 37), indeterminate, but may  be reactive. No appreciable hilar adenopathy. No enlarged axillary nodes. Esophagus within normal limits. Lungs/Pleura: The layering bilateral pleural effusions, right greater than left. Scattered interlobular septal thickening suggests mild pulmonary interstitial edema. Scattered ground-glass and patchy opacities within the right lower lobe, somewhat concerning for pneumonia. No other focal infiltrates. No pneumothorax. No worrisome pulmonary nodule or mass. Upper Abdomen: Peripherally calcified cyst noted arising from the upper pole left kidney, not well evaluated on this examination. Remainder the visualized upper abdomen otherwise unremarkable. Musculoskeletal: No  acute osseous abnormality. No worrisome lytic or blastic osseous lesions. Mild scoliosis noted. Review of the MIP images confirms the above findings. IMPRESSION: 1. No CT evidence for acute pulmonary embolism. 2. Patchy right lower lobe infiltrate, concerning for pneumonia. 3. Small moderate layering bilateral pleural effusions, right greater than left. 4. Cardiomegaly with mild underlying pulmonary interstitial edema. Electronically Signed   By: Jeannine Boga M.D.   On: 08/20/2016 16:56   Time spent: 25 minutes.  JESSICA Alison Stalling, DO [facility]   If 7PM-7AM, please contact night-coverage [email or web address] Password [email or web address] 08/22/2016, 7:59 AM

## 2016-08-22 NOTE — Progress Notes (Signed)
  Echocardiogram 2D Echocardiogram has been performed.  Johny Chess 08/22/2016, 5:50 PM

## 2016-08-23 ENCOUNTER — Encounter (HOSPITAL_COMMUNITY): Payer: Self-pay | Admitting: Internal Medicine

## 2016-08-23 DIAGNOSIS — F1410 Cocaine abuse, uncomplicated: Secondary | ICD-10-CM | POA: Diagnosis present

## 2016-08-23 DIAGNOSIS — I5043 Acute on chronic combined systolic (congestive) and diastolic (congestive) heart failure: Secondary | ICD-10-CM | POA: Diagnosis present

## 2016-08-23 DIAGNOSIS — D7589 Other specified diseases of blood and blood-forming organs: Secondary | ICD-10-CM | POA: Diagnosis present

## 2016-08-23 DIAGNOSIS — E876 Hypokalemia: Secondary | ICD-10-CM | POA: Diagnosis present

## 2016-08-23 DIAGNOSIS — J189 Pneumonia, unspecified organism: Secondary | ICD-10-CM | POA: Diagnosis present

## 2016-08-23 DIAGNOSIS — J9601 Acute respiratory failure with hypoxia: Secondary | ICD-10-CM

## 2016-08-23 DIAGNOSIS — R748 Abnormal levels of other serum enzymes: Secondary | ICD-10-CM

## 2016-08-23 DIAGNOSIS — J181 Lobar pneumonia, unspecified organism: Secondary | ICD-10-CM

## 2016-08-23 DIAGNOSIS — I5021 Acute systolic (congestive) heart failure: Secondary | ICD-10-CM

## 2016-08-23 DIAGNOSIS — E785 Hyperlipidemia, unspecified: Secondary | ICD-10-CM

## 2016-08-23 DIAGNOSIS — R7989 Other specified abnormal findings of blood chemistry: Secondary | ICD-10-CM | POA: Diagnosis present

## 2016-08-23 LAB — CBC
HCT: 40.9 % (ref 39.0–52.0)
HEMATOCRIT: 41.2 % (ref 39.0–52.0)
HEMOGLOBIN: 13.9 g/dL (ref 13.0–17.0)
Hemoglobin: 13.7 g/dL (ref 13.0–17.0)
MCH: 33.9 pg (ref 26.0–34.0)
MCH: 34.8 pg — AB (ref 26.0–34.0)
MCHC: 33.3 g/dL (ref 30.0–36.0)
MCHC: 34 g/dL (ref 30.0–36.0)
MCV: 102 fL — AB (ref 78.0–100.0)
MCV: 102.5 fL — ABNORMAL HIGH (ref 78.0–100.0)
PLATELETS: 251 10*3/uL (ref 150–400)
Platelets: 257 10*3/uL (ref 150–400)
RBC: 4.04 MIL/uL — ABNORMAL LOW (ref 4.22–5.81)
RBC: 3.99 MIL/uL — ABNORMAL LOW (ref 4.22–5.81)
RDW: 12.8 % (ref 11.5–15.5)
RDW: 12.8 % (ref 11.5–15.5)
WBC: 6.9 10*3/uL (ref 4.0–10.5)
WBC: 5.9 10*3/uL (ref 4.0–10.5)

## 2016-08-23 LAB — BASIC METABOLIC PANEL
ANION GAP: 10 (ref 5–15)
BUN: 11 mg/dL (ref 6–20)
CALCIUM: 8.6 mg/dL — AB (ref 8.9–10.3)
CO2: 21 mmol/L — AB (ref 22–32)
Chloride: 105 mmol/L (ref 101–111)
Creatinine, Ser: 1 mg/dL (ref 0.61–1.24)
GFR calc Af Amer: >60 mL/min (ref >60)
GLUCOSE: 99 mg/dL (ref 65–99)
Potassium: 3.4 mmol/L — ABNORMAL LOW (ref 3.5–5.1)
Sodium: 136 mmol/L (ref 135–145)

## 2016-08-23 LAB — TSH: TSH: 3.21 u[IU]/mL (ref 0.350–4.500)

## 2016-08-23 LAB — COOXEMETRY PANEL
Carboxyhemoglobin: 1.1 % (ref 0.5–1.5)
Carboxyhemoglobin: 0.6 % (ref 0.5–1.5)
METHEMOGLOBIN: 0.8 % (ref 0.0–1.5)
Methemoglobin: 0.7 % (ref 0.0–1.5)
O2 Saturation: 50.5 %
O2 Saturation: 57.5 %
TOTAL HEMOGLOBIN: 15.1 g/dL (ref 12.0–16.0)
Total hemoglobin: 15.3 g/dL (ref 12.0–16.0)

## 2016-08-23 LAB — MRSA CULTURE: CULTURE: NOT DETECTED

## 2016-08-23 MED ORDER — LORAZEPAM 2 MG/ML IJ SOLN: 1.0000 mg | Freq: Four times a day (QID) | INTRAMUSCULAR | Status: AC | PRN

## 2016-08-23 MED ORDER — MILRINONE LACTATE IN DEXTROSE 20-5 MG/100ML-% IV SOLN
0.1250 ug/kg/min | INTRAVENOUS | Status: DC
  Administered 2016-08-23 – 2016-08-27 (×7): 0.25 ug/kg/min via INTRAVENOUS
  Filled 2016-08-23 (×8): qty 100

## 2016-08-23 MED ORDER — LEVALBUTEROL HCL 1.25 MG/0.5ML IN NEBU
1.2500 mg | INHALATION_SOLUTION | Freq: Two times a day (BID) | RESPIRATORY_TRACT | Status: DC
  Administered 2016-08-23 – 2016-08-25 (×6): 1.25 mg via RESPIRATORY_TRACT
  Filled 2016-08-23 (×6): qty 0.5

## 2016-08-23 MED ORDER — POTASSIUM CHLORIDE CRYS ER 20 MEQ PO TBCR
40.0000 meq | EXTENDED_RELEASE_TABLET | Freq: Once | ORAL | Status: AC
  Administered 2016-08-23: 40 meq via ORAL
  Filled 2016-08-23: qty 2

## 2016-08-23 MED ORDER — POTASSIUM CHLORIDE CRYS ER 20 MEQ PO TBCR: 40.0000 meq | EXTENDED_RELEASE_TABLET | Freq: Once | ORAL | Status: DC

## 2016-08-23 MED ORDER — SODIUM CHLORIDE 0.9% FLUSH
10.0000 mL | INTRAVENOUS | Status: DC | PRN
  Administered 2016-08-24 – 2016-08-28 (×3): 10 mL
  Filled 2016-08-23 (×3): qty 40

## 2016-08-23 MED ORDER — SODIUM CHLORIDE 0.9% FLUSH
10.0000 mL | Freq: Two times a day (BID) | INTRAVENOUS | Status: DC
  Administered 2016-08-25 – 2016-08-27 (×4): 10 mL

## 2016-08-23 MED ORDER — IPRATROPIUM BROMIDE 0.02 % IN SOLN
0.5000 mg | Freq: Two times a day (BID) | RESPIRATORY_TRACT | Status: DC
Start: 2016-08-23 — End: 2016-08-26
  Administered 2016-08-23 – 2016-08-25 (×6): 0.5 mg via RESPIRATORY_TRACT
  Filled 2016-08-23 (×6): qty 2.5

## 2016-08-23 MED ORDER — FUROSEMIDE 10 MG/ML IJ SOLN
40.0000 mg | Freq: Once | INTRAMUSCULAR | Status: AC
  Administered 2016-08-23: 40 mg via INTRAVENOUS
  Filled 2016-08-23: qty 4

## 2016-08-23 MED ORDER — LORAZEPAM 1 MG PO TABS
1.0000 mg | ORAL_TABLET | Freq: Four times a day (QID) | ORAL | Status: AC | PRN
  Administered 2016-08-23 – 2016-08-25 (×3): 1 mg via ORAL
  Filled 2016-08-23 (×3): qty 1

## 2016-08-23 NOTE — Progress Notes (Addendum)
Patient ID: [name] [name], male   DOB: [birth year], 46 y.o.   MRN: [id number]  PROGRESS NOTE    [name] [name]  HGD:[id number] DOB: 12/15/1970 DOA: 08/20/2016  PCP: No PCP Per Patient   Brief Narrative:  46 y.o.malewith a medical history of polysubstance abuse, untreated HTN who presented with 1 week of gradually worsening dyspnea, and occasionally productive cough associated with pleuritic substernal, moderate, intermittent chest tightness. He was found to have SpO2 90% on room air, tachypnea, tachycardia, no fever. D dimer was positive. His CXR and CT scan showed patch opacity in right lower lobe concerning for pneumonia, bilateral pleural effusions, no PE. He was started on azithro, rocephin and flagyl. BNP was 1729. Lactic acid was 4.7, troponin 0.06. Cardio consulted.  Assessment & Plan:  Acute respiratory failure with hypoxia  - Likely secondary to community acquired pneumonia and volume overload with pleural effusions  - Stable resp status - Not started on lasix on admission but cardio consulted so will follow up on recommendations but he may benefit from lasix - Azithro and Rocephin started  - Continue current nebulizer treatments   Right lower lobe pneumonia - CXR and CT scan showed patchy right lower lobe infiltrate, concerning for pneumonia - Started on azithro, rocephin and flagyl; stop flagyl today - Blood cx negative - Strep pneumo, Legionella and HIV negative - Repeat CXR recommended in 3-4 weeks following trial of antibiotic therapy to ensure resolution and exclude underlying malignancy  Lactic acidosis - Lactic acid still elevated at 4.2 - Not septic - Continue to monitor lactic acid level   Hypokalemia - Supplemented today   Acute systolic and diastolic CHF / Small to moderate bilateral pleural effusions  - Appreciate cardio consultation - BNP on admission 1729 - ECHO on this admission showed EF 15% - Likely from cocaine abuse - Not started on lasix on  admission but will follow up with cardio as I believe he may benefit from lasix considering his BNP and pleuaral effusions - Stable resp status   Positive D Dimer - D dimer 1.85 on admission - CT angio chest showed no PE  Cocaine induced chest pain / Troponin elevation - Troponin mildly elevated, 0.06 x 2  -Likely demand ischemia from cocaine abuse  - The 12 lead EKG showed ST, LVH and lateral TWI which was not present in 2011. - ECHO showed EF 15% and severe global hypokinesis - Cardio consulted  - Continue aspirin daily   Essential hypertension - Chronic, untreated due to nonadherence.  - Holding beta blocker due to cocaine abuse - Started norvasc 5 mg and hydralazine PRN  Depression - Stable, no suicidal or homicidal ideations. Not taking meds at home now - Monitor  Alcohol/polysubstance abuse: - Monitor on CIWA protocol - UDS positive for cocaine and THC - Counseled on cessation   Macrocytosis - Without anemia, likely related to alcohol abuse.  - B12, folate normal   DVT prophylaxis: Lovenox subQ Code Status: full code  Family Communication: no family at the bedside this am Disposition Plan: needs to be seen by cardio due to mild trop elevation and EF of 15%   Consultants:   Cardio   Procedures:   ECHO 08/22/2016 - EF 15%, severe global hypokinesis, moderate MR  Antimicrobials:   Ceftriaxone, azithromycin 08/20/2016 -->  Flagyl 4/2 --> 4/5   Subjective: Has some chest tightness this am.   Objective: Vitals:   08/22/16 2018 08/22/16 2021 08/23/16 0524 08/23/16 0717  BP: 108/82  134/79  Pulse: (!) 113  (!) 113   Resp: 19  20   Temp: 98.3 F (36.8 C)  98.4 F (36.9 C)   TempSrc: Oral  Oral   SpO2: 97% 98% 97% 94%  Weight:   91.8 kg (202 lb 6.4 oz)   Height:        Intake/Output Summary (Last 24 hours) at 08/23/16 0901 Last data filed at 08/22/16 2019  Gross per 24 hour  Intake              480 ml  Output             1000 ml  Net              -520 ml   Filed Weights   08/20/16 2350 08/22/16 0558 08/23/16 0524  Weight: 94 kg (207 lb 3.7 oz) 92.4 kg (203 lb 11.2 oz) 91.8 kg (202 lb 6.4 oz)    Examination:  General exam: Appears calm and comfortable  Respiratory system: Diminished air entry throughout but no wheezing  Cardiovascular system: S1 & S2 heard, Rate controlled  Gastrointestinal system: Abdomen is nondistended, soft and nontender. No organomegaly or masses felt. Normal bowel sounds heard. Central nervous system: Alert and oriented. No focal neurological deficits. Extremities: Symmetric 5 x 5 power. Skin: No rashes, lesions or ulcers Psychiatry: Judgement and insight appear normal. Mood & affect appropriate.   Data Reviewed: I have personally reviewed following labs and imaging studies  CBC:  Recent Labs Lab 08/20/16 1131 08/22/16 0535 08/23/16 0332  WBC 6.3 8.4 6.9  HGB 14.7 14.5 13.7  HCT 43.4 42.7 41.2  MCV 102.6* 102.4* 102.0*  PLT 297 274 841   Basic Metabolic Panel:  Recent Labs Lab 08/20/16 1131 08/22/16 0535 08/23/16 0332  NA 140 137 136  K 3.8 3.6 3.4*  CL 108 105 105  CO2 21* 21* 21*  GLUCOSE 108* 105* 99  BUN 6 11 11   CREATININE 0.96 1.04 1.00  CALCIUM 9.0 8.6* 8.6*   GFR: Estimated Creatinine Clearance: 115.1 mL/min (by C-G formula based on SCr of 1 mg/dL). Liver Function Tests:  Recent Labs Lab 08/22/16 0535  AST 39  ALT 32  ALKPHOS 66  BILITOT 0.6  PROT 6.2*  ALBUMIN 3.2*   No results for input(s): LIPASE, AMYLASE in the last 168 hours. No results for input(s): AMMONIA in the last 168 hours. Coagulation Profile:  Recent Labs Lab 08/20/16 2308  INR 1.34   Cardiac Enzymes:  Recent Labs Lab 08/20/16 1931 08/21/16 0150 08/21/16 0731  TROPONINI 0.07* 0.06* 0.06*   BNP (last 3 results) No results for input(s): PROBNP in the last 8760 hours. HbA1C:  Recent Labs  08/21/16 0150  HGBA1C 4.9   CBG: No results for input(s): GLUCAP in the last 168  hours. Lipid Profile:  Recent Labs  08/21/16 0150  CHOL 159  HDL 41  LDLCALC 102*  TRIG 81  CHOLHDL 3.9   Thyroid Function Tests: No results for input(s): TSH, T4TOTAL, FREET4, T3FREE, THYROIDAB in the last 72 hours. Anemia Panel:  Recent Labs  08/22/16 0535  VITAMINB12 417  FOLATE 8.1   Urine analysis:  Sepsis Labs: @LABRCNTIP (procalcitonin:4,lacticidven:4)   Recent Results (from the past 240 hour(s))  Culture, blood (routine x 2) Call MD if unable to obtain prior to antibiotics being given     Status: None (Preliminary result)   Collection Time: 08/20/16  8:00 PM  Result Value Ref Range Status   Specimen Description BLOOD LEFT FOREARM  Final   Special Requests IN PEDIATRIC BOTTLE Blood Culture adequate volume  Final   Culture NO GROWTH 2 DAYS  Final   Report Status PENDING  Incomplete  Culture, blood (routine x 2) Call MD if unable to obtain prior to antibiotics being given     Status: None (Preliminary result)   Collection Time: 08/20/16  8:15 PM  Result Value Ref Range Status   Specimen Description BLOOD RIGHT HAND  Final   Special Requests   Final    BOTTLES DRAWN AEROBIC ONLY Blood Culture adequate volume   Culture NO GROWTH 2 DAYS  Final   Report Status PENDING  Incomplete  MRSA PCR Screening     Status: Abnormal   Collection Time: 08/21/16 12:37 AM  Result Value Ref Range Status   MRSA by PCR (A) NEGATIVE Final    INVALID, UNABLE TO DETERMINE THE PRESENCE OF TARGET DNA DUE TO SPECIMEN INTEGRITY. RECOLLECTION REQUESTED.    Comment: RESULT CALLED TO, READ BACK BY AND VERIFIED WITH: T POLING,RN @0745  08/21/16 MKELLY,MLT        The GeneXpert MRSA Assay (FDA approved for NASAL specimens only), is one component of a comprehensive MRSA colonization surveillance program. It is not intended to diagnose MRSA infection nor to guide or monitor treatment for MRSA infections.   MRSA culture     Status: None (Preliminary result)   Collection Time: 08/21/16  12:37 AM  Result Value Ref Range Status   Specimen Description NASAL SWAB  Final   Special Requests NONE  Final   Culture CULTURE REINCUBATED FOR BETTER GROWTH  Final   Report Status PENDING  Incomplete      Radiology Studies: Dg Chest 2 View Result Date: 08/20/2016 Patchy right lower lobe pneumonia. Bronchitic changes bilaterally. Followup PA and lateral chest X-ray is recommended in 3-4 weeks following trial of antibiotic therapy to ensure resolution and exclude underlying malignancy.    Ct Angio Chest Pe W Or Wo Contrast Result Date: 08/20/2016 1. No CT evidence for acute pulmonary embolism. 2. Patchy right lower lobe infiltrate, concerning for pneumonia. 3. Small moderate layering bilateral pleural effusions, right greater than left. 4. Cardiomegaly with mild underlying pulmonary interstitial edema.     Scheduled Meds: . amLODipine  5 mg Oral Daily  . aspirin  325 mg Oral Daily  . azithromycin  500 mg Oral QHS  . cefTRIAXone (ROCEPHIN)  IV  1 g Intravenous Q24H  . dextromethorphan-guaiFENesin  1 tablet Oral BID  . enoxaparin (LOVENOX) injection  40 mg Subcutaneous Daily  . folic acid  1 mg Oral Daily  . ipratropium  0.5 mg Nebulization BID  . levalbuterol  1.25 mg Nebulization BID  . metroNIDAZOLE  500 mg Oral Q8H  . multivitamin with minerals  1 tablet Oral Daily  . potassium chloride  40 mEq Oral Once  . thiamine  100 mg Oral Daily   Continuous Infusions:   LOS: 2 days    Time spent: 25 minutes  Greater than 50% of the time spent on counseling and coordinating the care.   Leisa Lenz, MD [facility] Pager [phone]  If 7PM-7AM, please contact night-coverage [email or web address] Password [email or web address] 08/23/2016, 9:01 AM

## 2016-08-23 NOTE — Progress Notes (Signed)
Peripherally Inserted Central Catheter/Midline Placement  The IV Nurse has discussed with the patient and/or persons authorized to consent for the patient, the purpose of this procedure and the potential benefits and risks involved with this procedure.  The benefits include less needle sticks, lab draws from the catheter, and the patient may be discharged home with the catheter. Risks include, but not limited to, infection, bleeding, blood clot (thrombus formation), and puncture of an artery; nerve damage and irregular heartbeat and possibility to perform a PICC exchange if needed/ordered by physician.  Alternatives to this procedure were also discussed.  Bard Power PICC patient education guide, fact sheet on infection prevention and patient information card has been provided to patient /or left at bedside.    PICC/Midline Placement Documentation  PICC Single Lumen 08/23/16 PICC Right Brachial 43 cm 0 cm (Active)  Indication for Insertion or Continuance of Line Vasoactive infusions 08/23/2016  2:45 PM  Exposed Catheter (cm) 0 cm 08/23/2016  2:45 PM  Site Assessment Clean;Dry;Intact 08/23/2016  2:45 PM  Line Status Flushed;Saline locked;Blood return noted 08/23/2016  2:45 PM  Dressing Type Transparent 08/23/2016  2:45 PM  Dressing Status Clean;Dry;Intact;Antimicrobial disc in place 08/23/2016  2:45 PM  Line Care Connections checked and tightened 08/23/2016  2:45 PM  Line Adjustment (NICU/IV Team Only) No 08/23/2016  2:45 PM  Dressing Intervention New dressing 08/23/2016  2:45 PM  Dressing Change Due 08/30/16 08/23/2016  2:45 PM       [name] 08/23/2016, 2:46 PM

## 2016-08-23 NOTE — Consult Note (Signed)
Patient ID: [name] [name] MRN: [id number], DOB/AGE: 1970-11-16   Admit date: 08/20/2016  Reason for Consult:  1. Elevated Troponin 2. New Systolic HF Requesting Physician: Dr. Leisa Lenz, Internal Medicine   Primary Physician: No PCP Per Patient Primary Cardiologist: New (Dr. Harrington Challenger)   Pt. Profile:  [name] [name] is a 46 y.o. AA male with a PMH of HTN, HLD, depression, polysubstance abuse, recent cocaine use, moderate ETOH consumption, and medication noncompliance who is being seen today for the evaluation of new systolic CHF and an elevated troponin, at the request of Dr. Leisa Lenz, Internal Medicine.   Problem List  Past Medical History:  Diagnosis Date  . COLONIC POLYPS, BENIGN 01/20/2010   Annotation: 12/2009: path benign; rpt 2016 Qualifier: Diagnosis of  By: Jorene Minors, Scott    . Hypertension     Past Surgical History:  Procedure Laterality Date  . POLYPECTOMY       Allergies  No Known Allergies  HPI  [name] [name] is a 46 y.o. AA male with a PMH of HTN, HLD, depression, polysubstance abuse, recent cocaine use, moderate ETOH consumption and medication noncompliance who is being seen today for the evaluation of new systolic CHF and an elevated troponin, at the request of Dr. Leisa Lenz, Internal Medicine.   He presented to the [facility] ED on 08/20/16 with a complaint of shortness of breath, cough and chest pain. Symptoms have been present x 1 week. His CP is described as substernal chest tightness. Does not radiate. It gets worse with exertion. No resting pain.    He admits to drinking a couple of beers every day. His UDS this admit was positive for cocaine and Tetrahydrocannabinol.   In the ED, he was septic with elevated lactate of 4.7, tachycardia and tachypnea, however he was afebrile with normal WBC. CXR demonstrated patchy infiltration in the left lower lobe. D-dimer was positive at 1.85. Subsequent CT angiogram was negative for PE, but showed patchy right  lower lobe infiltrate, concerning for pneumonia, as well as small moderate layering bilateral pleural effusions, right greater than left; cardiomegaly with mild underlying pulmonary interstitial edema. He was also noted to be hypertensive and in acute CHF, with an elevated BNP of 1729. He was admitted by IM and placed on antibiotics. Diuretics were initially held due to sepsis. He was given gentle IVFs. 2D echo was obtained on 08/22/16 which showed severely reduced LV systolic function, with an EF of 15%. Diffuse hypokinesis noted with moderate MR. He has evidence of aortic sclerosis w/o stenosis or regurgitation. Right ventricular systolic function is mildly reduced.   Cardiac enzymes have also been cycled.Troponins are low level with flat trend, c/w demand ischemia. Troponin trend 0.07>>0.06>>0.06. Admission EKG on 08/20/16 showed sinus tach, 117 bpm, with slight Twave abnormalities in lateral leads, LVH and LAE.   As outlined above, diuretics were initially held earlier during admission given sepsis, however he has since recieved 2 doses of IV lasix, 40 mg x 1 for the past 2 days. Net I/Os +3.8 L. BP is now stable/normotensive. Renal function is stable. SCr 1.00. BUN 11. He is mildly hypokalemic with K at 3.4. He is not currently on a ACE/ARB nor BB. No arrhthymias on telemetry.   Home Medications  Prior to Admission medications   Medication Sig Start Date End Date Taking? Authorizing Provider  atenolol (TENORMIN) 50 MG tablet Take 1 tablet (50 mg total) by mouth daily. Patient not taking: Reported on 08/20/2016 08/16/14  Niel Hummer, NP  FLUoxetine (PROZAC) 20 MG capsule Take 1 capsule (20 mg total) by mouth daily. Patient not taking: Reported on 08/20/2016 08/16/14   Niel Hummer, NP  folic acid (FOLVITE) 1 MG tablet Take 1 tablet (1 mg total) by mouth daily. Patient not taking: Reported on 08/20/2016 08/16/14   Niel Hummer, NP  Multiple Vitamin (MULTIVITAMIN WITH MINERALS) TABS tablet Take 1 tablet by  mouth daily. Patient not taking: Reported on 08/20/2016 08/16/14   Niel Hummer, NP  predniSONE (STERAPRED UNI-PAK) 10 MG tablet Take by mouth taper from 4 doses each day to 1 dose and stop. Take one tablet daily until finished. Patient not taking: Reported on 08/20/2016 08/17/14   Niel Hummer, NP  traZODone (DESYREL) 50 MG tablet Take 1 tablet (50 mg total) by mouth at bedtime as needed and may repeat dose one time if needed for sleep. Patient not taking: Reported on 08/20/2016 08/16/14   Niel Hummer, NP    Hospital Medications  . amLODipine  5 mg Oral Daily  . aspirin  325 mg Oral Daily  . azithromycin  500 mg Oral QHS  . cefTRIAXone (ROCEPHIN)  IV  1 g Intravenous Q24H  . dextromethorphan-guaiFENesin  1 tablet Oral BID  . enoxaparin (LOVENOX) injection  40 mg Subcutaneous Daily  . folic acid  1 mg Oral Daily  . ipratropium  0.5 mg Nebulization BID  . levalbuterol  1.25 mg Nebulization BID  . multivitamin with minerals  1 tablet Oral Daily  . thiamine  100 mg Oral Daily    hydrALAZINE, LORazepam **OR** LORazepam, nitroGLYCERIN, ondansetron, oxyCODONE-acetaminophen  Family History  Family History  Problem Relation Age of Onset  . Diabetes Other   . Hypertension Other     Social History  Social History   Social History  . Marital status: Single    Spouse name: N/A  . Number of children: N/A  . Years of education: N/A   Occupational History  . Not on file.   Social History Main Topics  . Smoking status: Never Smoker  . Smokeless tobacco: Never Used  . Alcohol use Yes     Comment: (2) 24 oz beers every other day pt reports  . Drug use: Yes    Types: Marijuana     Comment: pt reports marijuana use occasionally  . Sexual activity: Yes    Birth control/ protection: None   Other Topics Concern  . Not on file   Social History Narrative  . No narrative on file     Review of Systems General:  No chills, fever, night sweats or weight changes.  Cardiovascular:  No  chest pain, dyspnea on exertion, edema, orthopnea, palpitations, paroxysmal nocturnal dyspnea. Dermatological: No rash, lesions/masses Respiratory: No cough, dyspnea Urologic: No hematuria, dysuria Abdominal:   No nausea, vomiting, diarrhea, bright red blood per rectum, melena, or hematemesis Neurologic:  No visual changes, wkns, changes in mental status. All other systems reviewed and are otherwise negative except as noted above.  Physical Exam  Blood pressure 134/79, pulse (!) 113, temperature 98.4 F (36.9 C), temperature source Oral, resp. rate 20, height 6\' 5"  (1.956 m), weight 202 lb 6.4 oz (91.8 kg), SpO2 94 %.  General: Pleasant, NAD Psych: Normal affect. Neuro: Alert and oriented X 3. Moves all extremities spontaneously. HEENT: Normal  Neck: Supple without bruits or JVD. Lungs:  Resp regular and unlabored, CTA. Heart: RRR no s3, s4, or murmurs. Abdomen: Soft, + for RUQ  tenderness, non-distended, BS + x 4.  Extremities: No clubbing, cyanosis or edema. DP/PT/Radials 2+ and equal bilaterally.  Labs  Troponin (Point of Care Test) No results for input(s): TROPIPOC in the last 72 hours.  Recent Labs  08/20/16 1931 08/21/16 0150 08/21/16 0731  TROPONINI 0.07* 0.06* 0.06*   Lab Results  Component Value Date   WBC 5.9 08/23/2016   HGB 13.9 08/23/2016   HCT 40.9 08/23/2016   MCV 102.5 (H) 08/23/2016   PLT 257 08/23/2016     Recent Labs Lab 08/22/16 0535 08/23/16 0332  NA 137 136  K 3.6 3.4*  CL 105 105  CO2 21* 21*  BUN 11 11  CREATININE 1.04 1.00  CALCIUM 8.6* 8.6*  PROT 6.2*  --   BILITOT 0.6  --   ALKPHOS 66  --   ALT 32  --   AST 39  --   GLUCOSE 105* 99   Lab Results  Component Value Date   CHOL 159 08/21/2016   HDL 41 08/21/2016   LDLCALC 102 (H) 08/21/2016   TRIG 81 08/21/2016   Lab Results  Component Value Date   DDIMER 1.85 (H) 08/20/2016     Radiology/Studies  Dg Chest 2 View  Result Date: 08/20/2016 CLINICAL DATA:  Increase  shortness of breath and chest tightness over the past week EXAM: CHEST  2 VIEW COMPARISON:  None in PACs FINDINGS: The lungs are well-expanded. The interstitial markings are coarse. Patchy alveolar opacity is present in the right lower lobe. There is no pleural effusion. The heart and pulmonary vascularity are normal. The trachea is midline. The mediastinum is normal in width. The bony thorax is unremarkable. IMPRESSION: Patchy right lower lobe pneumonia. Bronchitic changes bilaterally. Followup PA and lateral chest X-ray is recommended in 3-4 weeks following trial of antibiotic therapy to ensure resolution and exclude underlying malignancy. Electronically Signed   By: David  Martinique M.D.   On: 08/20/2016 11:50   Ct Angio Chest Pe W Or Wo Contrast  Result Date: 08/20/2016 CLINICAL DATA:  Initial evaluation for acute shortness of breath, tachycardia, chest pain. EXAM: CT ANGIOGRAPHY CHEST WITH CONTRAST TECHNIQUE: Multidetector CT imaging of the chest was performed using the standard protocol during bolus administration of intravenous contrast. Multiplanar CT image reconstructions and MIPs were obtained to evaluate the vascular anatomy. COMPARISON:  Prior radiograph from earlier the same day. FINDINGS: Cardiovascular: Intrathoracic aorta of normal caliber without acute abnormality. Partially visualized great vessels grossly unremarkable. Cardiomegaly.  No pericardial effusion. Pulmonary arterial tree adequately opacified for evaluation. Main pulmonary artery within normal limits for size measuring 2.9 cm in diameter. The the the no filling defect to suggest acute pulmonary embolism identified. Re-formatted imaging confirms these findings. Mediastinum/Nodes: Partially visualized thyroid is unremarkable. Scattered mediastinal lymph nodes measuring up to 15 mm present (series 5, image 37), indeterminate, but may be reactive. No appreciable hilar adenopathy. No enlarged axillary nodes. Esophagus within normal limits.  Lungs/Pleura: The layering bilateral pleural effusions, right greater than left. Scattered interlobular septal thickening suggests mild pulmonary interstitial edema. Scattered ground-glass and patchy opacities within the right lower lobe, somewhat concerning for pneumonia. No other focal infiltrates. No pneumothorax. No worrisome pulmonary nodule or mass. Upper Abdomen: Peripherally calcified cyst noted arising from the upper pole left kidney, not well evaluated on this examination. Remainder the visualized upper abdomen otherwise unremarkable. Musculoskeletal: No acute osseous abnormality. No worrisome lytic or blastic osseous lesions. Mild scoliosis noted. Review of the MIP images confirms the above findings. IMPRESSION: 1.  No CT evidence for acute pulmonary embolism. 2. Patchy right lower lobe infiltrate, concerning for pneumonia. 3. Small moderate layering bilateral pleural effusions, right greater than left. 4. Cardiomegaly with mild underlying pulmonary interstitial edema. Electronically Signed   By: Jeannine Boga M.D.   On: 08/20/2016 16:56    ECG  EKG on 08/20/16 showed sinus tach, 117 bpm, with slight Twave abnormalities in lateral leads, LVH and LAE.   -- personally reviewed  Telemetry  Sinus tach -- personally reviewed   Echocardiogram 08/22/16 Study Conclusions  - Left ventricle: The cavity size was normal. Wall thickness was   normal. Systolic function was severely reduced. The estimated   ejection fraction was 15%. Diffuse hypokinesis. The study is not   technically sufficient to allow evaluation of LV diastolic   function. - Aortic valve: Trileaflet. Sclerosis without stenosis. There was   no significant regurgitation. - Mitral valve: Mildly thickened leaflets . There was moderate   regurgitation. - Left atrium: Moderately dilated. - Right ventricle: The cavity size was mildly dilated. Systolic   function is mildly reduced. - Right atrium: The atrium was mildly  dilated. - Inferior vena cava: The vessel was dilated. The respirophasic   diameter changes were blunted (< 50%), consistent with elevated   central venous pressure.  Impressions:  - LVEF 15%, severe global hypokinesis, normal wall thickness,   aortic valve sclerosis, moderate MR, moderately dilated LA, mild   RAE, mild RV systolic dysfunction, dilated IVC.  ASSESSMENT AND PLAN  Principal Problem:   Acute respiratory failure with hypoxia (HCC) Active Problems:   Essential hypertension   Major depressive disorder, recurrent severe without psychotic features (HCC)   Chest pain   Alcohol use   Elevated troponin   Cocaine abuse   Right lower lobe pneumonia (HCC)   Acute on chronic combined systolic and diastolic CHF (congestive heart failure) (HCC)   Positive D dimer   Hypokalemia   Macrocytosis   1. Acute Respiratory Failure with Hypoxia: multifactorial, in the setting of acute systolic CHF and PNA.   2. Acute Systolic HF: new diagnosis. 2D echo 08/22/16 demonstrated severely reduced LVEF at 15% with diffuse hypokinesis and moderate MR. No prior study for comparison. BNP elevated at 1729.  -- Pt complaining of orthopnea. No lasix was given today. I/Os net +3L. Will give a 1 x dose of 40 mg Lasix IV now. -- Once he recovers from his PNA and diuresed, he will need a R/LHC to help determine the etiology and rule out underlying CAD. He does have risk factors including HTN, HLD and cocaine use. However, this may be nonischemic dilated,CM secondary to [facility] use and poorly controlled HTN.  -- In the meantime, continue to diuresis with IV Lasix. RHC will also help to guide diuresis.  -- Once euvolemic, we can consider addition of a BB, although we will need to be cautious given his cocaine history.  -- Now that BP is more stable, recommend addition of an ARB, losartan 25 mg daily. If tolerating ok, consider switch to [facility]. -- Monitor for arrhthymias on telemetry -- Low sodium diet,  daily weights, daily BMPs for renal function and electrolyte monitoring -- Needs substance abuse counseling. Discontinuation of ETOH and cocaine is imperative   3. Elevated Troponin: trend is low level and flat, 0.07>>0.06>>0.06. This is likely demand ischemia secondary to acute CHF, PNA and HTN. However, given his severely reduced LVEF, he will need a LHC to exclude CAD.   4. Polysubstance Abuse: UDS +  for cocaine, and Tetrahydrocannabinol. He also admits to moderate to heavy ETOH consumption. Needs substance abuse counseling. Discontinuation of ETOH and cocaine is imperative.  5. HLD: Lipid panel shows LDL at 102 mg/dL. If LHC confirms the presence of CAD, he will need initiation of a statin with goal LDL of < 70 mg/dL.    6. Hypokalemia: K was 3.4 today, however not repleated. Will give a dose of Lasix now. I will also give 40 mEQ for K-dur now. F/u BMP in the am.   7. Mitral Regurgitation: moderate by echo. EF 15%.    Signed, Lyda Jester, PA-C, M[facility] 08/23/2016, 12:05 PM [facility] Pager: [phone]   Pt seen and examined  I agree with findings as noted by B SImmons above  Pt is a 46 yo with history of EtOH and cocaine use.  PResents with SOB  Being treated for presumed pneumonia/sepsis  Echo shows LV is mildly dilated  And LVEF severely depressed The pt denies CP  DId say he had a cold about a month ago  Had not fully recovered from breathing standpoint  Now with minimal exertion he says he gets SOB  HR is fast.  He is not able to lie flat  ON exam:  Neck with increased JVP  LUngs:  Rales at bases  Cardiac exam  RRR  Norml S1/S2  No S3  Abd :  RUQ tender  Ext without edema EKG shows LVH with repol abnormality  NO Q waves   Pt has remained in Sinus tachycardia since hosp (110s) He just received lasix, urine appears concentrated   I would recomm placing a PICC line and obtaining a COOX level. For possible inotrope support. He may not respond to lasix alone WIll need  catheterization once volume status improved   Dorris Carnes

## 2016-08-24 LAB — BASIC METABOLIC PANEL
Anion gap: 10 (ref 5–15)
BUN: 9 mg/dL (ref 6–20)
CHLORIDE: 102 mmol/L (ref 101–111)
CO2: 24 mmol/L (ref 22–32)
CREATININE: 1.06 mg/dL (ref 0.61–1.24)
Calcium: 8.5 mg/dL — ABNORMAL LOW (ref 8.9–10.3)
GFR calc non Af Amer: >60 mL/min (ref >60)
Glucose, Bld: 126 mg/dL — ABNORMAL HIGH (ref 65–99)
Potassium: 3.2 mmol/L — ABNORMAL LOW (ref 3.5–5.1)
Sodium: 136 mmol/L (ref 135–145)

## 2016-08-24 LAB — COOXEMETRY PANEL
Carboxyhemoglobin: 0.7 % (ref 0.5–1.5)
Methemoglobin: 0.9 % (ref 0.0–1.5)
O2 SAT: 89 %
Total hemoglobin: 13.8 g/dL (ref 12.0–16.0)

## 2016-08-24 LAB — MAGNESIUM: MAGNESIUM: 1.5 mg/dL — AB (ref 1.7–2.4)

## 2016-08-24 MED ORDER — LOSARTAN POTASSIUM 50 MG PO TABS
50.0000 mg | ORAL_TABLET | Freq: Every day | ORAL | Status: DC
  Administered 2016-08-25 – 2016-08-29 (×5): 50 mg via ORAL
  Filled 2016-08-24 (×5): qty 1

## 2016-08-24 MED ORDER — POTASSIUM CHLORIDE CRYS ER 20 MEQ PO TBCR
20.0000 meq | EXTENDED_RELEASE_TABLET | Freq: Two times a day (BID) | ORAL | Status: DC
  Administered 2016-08-24 – 2016-08-27 (×7): 20 meq via ORAL
  Filled 2016-08-24 (×7): qty 1

## 2016-08-24 MED ORDER — FUROSEMIDE 10 MG/ML IJ SOLN
80.0000 mg | Freq: Two times a day (BID) | INTRAMUSCULAR | Status: DC
  Administered 2016-08-24 – 2016-08-25 (×2): 80 mg via INTRAVENOUS
  Filled 2016-08-24 (×2): qty 8

## 2016-08-24 MED ORDER — FUROSEMIDE 10 MG/ML IJ SOLN
80.0000 mg | Freq: Once | INTRAMUSCULAR | Status: AC
  Administered 2016-08-24: 80 mg via INTRAVENOUS
  Filled 2016-08-24: qty 8

## 2016-08-24 MED ORDER — MAGNESIUM SULFATE 2 GM/50ML IV SOLN
2.0000 g | Freq: Once | INTRAVENOUS | Status: AC
  Administered 2016-08-24: 2 g via INTRAVENOUS
  Filled 2016-08-24: qty 50

## 2016-08-24 MED ORDER — POTASSIUM CHLORIDE CRYS ER 20 MEQ PO TBCR
40.0000 meq | EXTENDED_RELEASE_TABLET | Freq: Once | ORAL | Status: AC
  Administered 2016-08-24: 40 meq via ORAL
  Filled 2016-08-24: qty 2

## 2016-08-24 NOTE — Progress Notes (Signed)
Pt requests that his condition/treatment plan not be discussed in front of or with any visitors/family/friends. Pt advised to ask his visitors to leave the room when care-team enters, as further precaution.  Maud Deed Tobias-Diakun,RN 08/24/16 1:49 PM

## 2016-08-24 NOTE — Progress Notes (Signed)
[facility] PROGRESS NOTE  [name] [name] EGB:[id number] DOB: 04-Mar-1971 DOA: 08/20/2016  PCP: No PCP Per Patient  Brief History/Interval Summary: 46 year old African-American male with a past medical history of polysubstance abuse, untreated hypertension, presented with one-week history of worsening dyspnea and cough. Also mentioned chest tightness. He had elevated d-dimer. He underwent CT scan which did not show any PE but showed findings concerning for pneumonia. Bilateral pleural effusions were also noted. He was also found to have elevated BNP. Lactic acid level was elevated along with troponin. Cardiology was consulted.  Reason for Visit: Acute systolic CHF  Consultants: Cardiology  Procedures:  Transthoracic echocardiogram Study Conclusions - Left ventricle: The cavity size was normal. Wall thickness was   normal. Systolic function was severely reduced. The estimated   ejection fraction was 15%. Diffuse hypokinesis. The study is not   technically sufficient to allow evaluation of LV diastolic   function. - Aortic valve: Trileaflet. Sclerosis without stenosis. There was   no significant regurgitation. - Mitral valve: Mildly thickened leaflets . There was moderate   regurgitation. - Left atrium: Moderately dilated. - Right ventricle: The cavity size was mildly dilated. Systolic   function is mildly reduced. - Right atrium: The atrium was mildly dilated. - Inferior vena cava: The vessel was dilated. The respirophasic   diameter changes were blunted (< 50%), consistent with elevated   central venous pressure.  Impressions:  - LVEF 15%, severe global hypokinesis, normal wall thickness,   aortic valve sclerosis, moderate MR, moderately dilated LA, mild   RAE, mild RV systolic dysfunction, dilated IVC.  Antibiotics: Ceftriaxone and azithromycin  Subjective/Interval History: Patient mentions some chest tightness this morning. Denies any shortness of breath. No  nausea, vomiting.  ROS: Denies any headaches.  Objective:  Vital Signs  Vitals:   08/23/16 2343 08/24/16 0413 08/24/16 0707 08/24/16 0916  BP: 123/85 (!) 127/94  119/80  Pulse: (!) 109 (!) 112  (!) 112  Resp: 17 18  18   Temp: 97.5 F (36.4 C) 97.6 F (36.4 C)    TempSrc: Oral Oral    SpO2:  99% 99% 99%  Weight:  90.6 kg (199 lb 12.8 oz)    Height:        Intake/Output Summary (Last 24 hours) at 08/24/16 1216 Last data filed at 08/24/16 1100  Gross per 24 hour  Intake          1239.04 ml  Output             3350 ml  Net         -2110.96 ml   Filed Weights   08/22/16 0558 08/23/16 0524 08/24/16 0413  Weight: 92.4 kg (203 lb 11.2 oz) 91.8 kg (202 lb 6.4 oz) 90.6 kg (199 lb 12.8 oz)    General appearance: alert, cooperative, appears stated age and no distress Resp: Diminished air entry at the bases. Few crackles. No rhonchi or wheezing. Cardio: regular rate and rhythm, S1, S2 normal, no murmur, click, rub or gallop GI: soft, non-tender; bowel sounds normal; no masses,  no organomegaly Extremities: extremities normal, atraumatic, no cyanosis or edema Neurologic: Awake and alert. Oriented 3. No focal neurological deficits are noted.  Lab Results:  Data Reviewed: I have personally reviewed following labs and imaging studies  CBC:  Recent Labs Lab 08/20/16 1131 08/22/16 0535 08/23/16 0332 08/23/16 0954  WBC 6.3 8.4 6.9 5.9  HGB 14.7 14.5 13.7 13.9  HCT 43.4 42.7 41.2 40.9  MCV 102.6* 102.4* 102.0* 102.5*  PLT 297 274 251 440    Basic Metabolic Panel:  Recent Labs Lab 08/20/16 1131 08/22/16 0535 08/23/16 0332 08/24/16 0510 08/24/16 0818  NA 140 137 136 136  --   K 3.8 3.6 3.4* 3.2*  --   CL 108 105 105 102  --   CO2 21* 21* 21* 24  --   GLUCOSE 108* 105* 99 126*  --   BUN 6 11 11 9   --   CREATININE 0.96 1.04 1.00 1.06  --   CALCIUM 9.0 8.6* 8.6* 8.5*  --   MG  --   --   --   --  1.5*    GFR: Estimated Creatinine Clearance: 108.6 mL/min (by C-G  formula based on SCr of 1.06 mg/dL).  Liver Function Tests:  Recent Labs Lab 08/22/16 0535  AST 39  ALT 32  ALKPHOS 66  BILITOT 0.6  PROT 6.2*  ALBUMIN 3.2*   Coagulation Profile:  Recent Labs Lab 08/20/16 2308  INR 1.34    Cardiac Enzymes:  Recent Labs Lab 08/20/16 1931 08/21/16 0150 08/21/16 0731  TROPONINI 0.07* 0.06* 0.06*   Thyroid Function Tests:  Recent Labs  08/23/16 1514  TSH 3.210    Anemia Panel:  Recent Labs  08/22/16 0535  VITAMINB12 417  FOLATE 8.1    Recent Results (from the past 240 hour(s))  Culture, blood (routine x 2) Call MD if unable to obtain prior to antibiotics being given     Status: None (Preliminary result)   Collection Time: 08/20/16  8:00 PM  Result Value Ref Range Status   Specimen Description BLOOD LEFT FOREARM  Final   Special Requests IN PEDIATRIC BOTTLE Blood Culture adequate volume  Final   Culture NO GROWTH 3 DAYS  Final   Report Status PENDING  Incomplete  Culture, blood (routine x 2) Call MD if unable to obtain prior to antibiotics being given     Status: None (Preliminary result)   Collection Time: 08/20/16  8:15 PM  Result Value Ref Range Status   Specimen Description BLOOD RIGHT HAND  Final   Special Requests   Final    BOTTLES DRAWN AEROBIC ONLY Blood Culture adequate volume   Culture NO GROWTH 3 DAYS  Final   Report Status PENDING  Incomplete  MRSA PCR Screening     Status: Abnormal   Collection Time: 08/21/16 12:37 AM  Result Value Ref Range Status   MRSA by PCR (A) NEGATIVE Final    INVALID, UNABLE TO DETERMINE THE PRESENCE OF TARGET DNA DUE TO SPECIMEN INTEGRITY. RECOLLECTION REQUESTED.    Comment: RESULT CALLED TO, READ BACK BY AND VERIFIED WITH: T POLING,RN @0745  08/21/16 MKELLY,MLT        The GeneXpert MRSA Assay (FDA approved for NASAL specimens only), is one component of a comprehensive MRSA colonization surveillance program. It is not intended to diagnose MRSA infection nor to guide  or monitor treatment for MRSA infections.   MRSA culture     Status: None   Collection Time: 08/21/16 12:37 AM  Result Value Ref Range Status   Specimen Description NASAL SWAB  Final   Special Requests NONE  Final   Culture NO MRSA DETECTED  Final   Report Status 08/23/2016 FINAL  Final      Radiology Studies: No results found.   Medications:  Scheduled: . aspirin  325 mg Oral Daily  . azithromycin  500 mg Oral QHS  . cefTRIAXone (ROCEPHIN)  IV  1 g Intravenous Q24H  .  dextromethorphan-guaiFENesin  1 tablet Oral BID  . enoxaparin (LOVENOX) injection  40 mg Subcutaneous Daily  . folic acid  1 mg Oral Daily  . ipratropium  0.5 mg Nebulization BID  . levalbuterol  1.25 mg Nebulization BID  . [START ON 08/25/2016] losartan  50 mg Oral Daily  . multivitamin with minerals  1 tablet Oral Daily  . sodium chloride flush  10-40 mL Intracatheter Q12H  . thiamine  100 mg Oral Daily   Continuous: . milrinone 0.25 mcg/kg/min (08/24/16 0309)   GYI:[id number], LORazepam **OR** LORazepam, nitroGLYCERIN, ondansetron, oxyCODONE-acetaminophen, sodium chloride flush  Assessment/Plan:  Principal Problem:   Acute respiratory failure with hypoxia (HCC) Active Problems:   Essential hypertension   Major depressive disorder, recurrent severe without psychotic features ([place])   Chest pain   Alcohol use   Elevated troponin   Cocaine abuse   Right lower lobe pneumonia (HCC)   Acute on chronic combined systolic and diastolic CHF (congestive heart failure) (HCC)   Positive D dimer   Hypokalemia   Macrocytosis    Acute respiratory failure with hypoxia  Likely secondary to community acquired pneumonia and volume overload with pleural effusions. Patient started on and will Rx was also given diuretics. Seems to be stable.  Acute systolic and diastolic CHF / Small to moderate bilateral pleural effusions  Cardiology was consulted. Echocardiogram showed severely decreased LV function with EF of  15%. Patient does have a history of cocaine abuse. He may need ischemic workup, but will defer to cardiology. Patient was started on milrinone by cardiology. PICC line had to be placed.  Right lower lobe pneumonia CXR and CT scan showed patchy right lower lobe infiltrate, concerning for pneumonia. Started on azithro, rocephin and flagyl. Flagyl was discontinued. Follow-up on cultures which are negative so far. HIV is nonreactive. Repeat CXR recommended in 3-4 weeks following trial of antibiotic therapy to ensure resolution and exclude underlying malignancy.  Lactic acidosis Lactic acid was elevated at 4.2. No evidence for sepsis. This is most likely due to his cardiac process. Will need to be repeated.  Hypokalemia and hypomagnesemia Supplemented today   Positive D Dimer D dimer 1.85 on admission. CT angio chest showed no PE.  Cocaine induced chest pain / Troponin elevation Troponin mildly elevated, 0.06 x 2. Likely demand ischemia from cocaine abuse. The 12 lead EKG showed ST, LVH and lateral TWI which was not present in 2011. ECHO showed EF 15% and severe global hypokinesis. Continue aspirin. LDL 102. Further workup per cardiology.  Essential hypertension Chronic, untreated due to nonadherence. Holding beta blocker due to cocaine abuse. Started norvasc 5 mg and hydralazine PRN.  Depression Stable, no suicidal or homicidal ideations. Not taking meds at home now.  Alcohol/polysubstance abuse Monitor on CIWA protocol. UDS positive for cocaine and THC.   Macrocytosis Without anemia, likely related to alcohol abuse. TSH, B12, folate normal   DVT Prophylaxis: Lovenox    Code Status: Full code  Family Communication: Discussed with the patient  Disposition Plan: Management as outlined above. Mobilize as tolerated.    LOS: 3 days   [facility] Pager [phone] 08/24/2016, 12:16 PM  If 7PM-7AM, please contact night-coverage at [email or web address], password  [facility]

## 2016-08-24 NOTE — Progress Notes (Signed)
Pt stated that he took his bath early this am

## 2016-08-24 NOTE — Progress Notes (Signed)
Progress Note  Patient Name: [name] [name] Date of Encounter: 08/24/2016  Primary Cardiologist: New  Subjective   Breathing better than yesterday  Able to lay flat  No CP    Inpatient Medications    Scheduled Meds: . amLODipine  5 mg Oral Daily  . aspirin  325 mg Oral Daily  . azithromycin  500 mg Oral QHS  . cefTRIAXone (ROCEPHIN)  IV  1 g Intravenous Q24H  . dextromethorphan-guaiFENesin  1 tablet Oral BID  . enoxaparin (LOVENOX) injection  40 mg Subcutaneous Daily  . folic acid  1 mg Oral Daily  . furosemide  80 mg Intravenous Once  . ipratropium  0.5 mg Nebulization BID  . levalbuterol  1.25 mg Nebulization BID  . multivitamin with minerals  1 tablet Oral Daily  . sodium chloride flush  10-40 mL Intracatheter Q12H  . thiamine  100 mg Oral Daily   Continuous Infusions: . milrinone 0.25 mcg/kg/min (08/24/16 0309)   PRN Meds: hydrALAZINE, LORazepam **OR** LORazepam, nitroGLYCERIN, ondansetron, oxyCODONE-acetaminophen, sodium chloride flush   Vital Signs    Vitals:   08/23/16 2230 08/23/16 2343 08/24/16 0413 08/24/16 0707  BP: 124/90 123/85 (!) 127/94   Pulse: 100 (!) 109 (!) 112   Resp: 18 17 18    Temp: 97.8 F (36.6 C) 97.5 F (36.4 C) 97.6 F (36.4 C)   TempSrc: Oral Oral Oral   SpO2: 99% 98% 99% 99%  Weight:   199 lb 12.8 oz (90.6 kg)   Height:        Intake/Output Summary (Last 24 hours) at 08/24/16 0758 Last data filed at 08/24/16 0700  Gross per 24 hour  Intake          1211.44 ml  Output             1600 ml  Net          -388.56 ml   Filed Weights   08/22/16 0558 08/23/16 0524 08/24/16 0413  Weight: 203 lb 11.2 oz (92.4 kg) 202 lb 6.4 oz (91.8 kg) 199 lb 12.8 oz (90.6 kg)    Telemetry    SR/ST   - Personally Reviewed  ECG    Physical Exam  No CP   GEN: No acute distress.   Neck: JVP is increased   Cardiac: RRR, no murmurs, rubs, or gallops.  Respiratory: Clear to auscultation bilaterally. GI: Mild RUQ tenderness   MS: No edema;  No deformity. Neuro:  Nonfocal  Psych: Normal affect   Labs    Chemistry Recent Labs Lab 08/22/16 0535 08/23/16 0332 08/24/16 0510  NA 137 136 136  K 3.6 3.4* 3.2*  CL 105 105 102  CO2 21* 21* 24  GLUCOSE 105* 99 126*  BUN 11 11 9   CREATININE 1.04 1.00 1.06  CALCIUM 8.6* 8.6* 8.5*  PROT 6.2*  --   --   ALBUMIN 3.2*  --   --   AST 39  --   --   ALT 32  --   --   ALKPHOS 66  --   --   BILITOT 0.6  --   --   GFRNONAA >60 >60 >60  GFRAA >60 >60 >60  ANIONGAP 11 10 10      Hematology Recent Labs Lab 08/22/16 0535 08/23/16 0332 08/23/16 0954  WBC 8.4 6.9 5.9  RBC 4.17* 4.04* 3.99*  HGB 14.5 13.7 13.9  HCT 42.7 41.2 40.9  MCV 102.4* 102.0* 102.5*  MCH 34.8* 33.9 34.8*  MCHC 34.0  33.3 34.0  RDW 13.4 12.8 12.8  PLT 274 251 257    Cardiac Enzymes Recent Labs Lab 08/20/16 1931 08/21/16 0150 08/21/16 0731  TROPONINI 0.07* 0.06* 0.06*    Recent Labs Lab 08/20/16 1154  TROPIPOC 0.05     BNP Recent Labs Lab 08/20/16 2004  BNP 1,729.9*     DDimer  Recent Labs Lab 08/20/16 1428  DDIMER 1.85*     Radiology    No results found.  Cardiac Studies     Patient Profile     46 y.o. male hx of resp distress, polysubstance abuse, Now with CHF   Assessment & Plan    1  Acute systolic CHF  Coox improved with milrinone  Pt is breathig better  Says he is urinating a lot   Has not been saving   Lasix given  WIll try to sched R/L heart cath.  If not able then plan for Monday Keep on milrinone for now.  2  Polysubstance use  Counselled on cessation of cocaine and EtOH (drinks 4 beers per day)  3  HTN  Diastolic intermitt increase  On amlodipine  Will swtch to Cozaar tomorrow.    Signed, Dorris Carnes, MD  08/24/2016, 7:58 AM

## 2016-08-25 DIAGNOSIS — I5043 Acute on chronic combined systolic (congestive) and diastolic (congestive) heart failure: Secondary | ICD-10-CM

## 2016-08-25 DIAGNOSIS — Z789 Other specified health status: Secondary | ICD-10-CM

## 2016-08-25 DIAGNOSIS — I10 Essential (primary) hypertension: Secondary | ICD-10-CM

## 2016-08-25 DIAGNOSIS — F1410 Cocaine abuse, uncomplicated: Secondary | ICD-10-CM

## 2016-08-25 LAB — BASIC METABOLIC PANEL
ANION GAP: 10 (ref 5–15)
BUN: 9 mg/dL (ref 6–20)
CHLORIDE: 100 mmol/L — AB (ref 101–111)
CO2: 26 mmol/L (ref 22–32)
Calcium: 8.4 mg/dL — ABNORMAL LOW (ref 8.9–10.3)
Creatinine, Ser: 1 mg/dL (ref 0.61–1.24)
GFR calc Af Amer: >60 mL/min (ref >60)
GFR calc non Af Amer: >60 mL/min (ref >60)
Glucose, Bld: 102 mg/dL — ABNORMAL HIGH (ref 65–99)
Potassium: 3.7 mmol/L (ref 3.5–5.1)
SODIUM: 136 mmol/L (ref 135–145)

## 2016-08-25 LAB — COOXEMETRY PANEL
Carboxyhemoglobin: 0.6 % (ref 0.5–1.5)
METHEMOGLOBIN: 0.8 % (ref 0.0–1.5)
O2 SAT: 65.3 %
TOTAL HEMOGLOBIN: 14.6 g/dL (ref 12.0–16.0)

## 2016-08-25 LAB — MAGNESIUM: MAGNESIUM: 1.8 mg/dL (ref 1.7–2.4)

## 2016-08-25 LAB — BRAIN NATRIURETIC PEPTIDE: B Natriuretic Peptide: 1067.1 pg/mL — ABNORMAL HIGH (ref 0.0–100.0)

## 2016-08-25 LAB — CULTURE, BLOOD (ROUTINE X 2)
CULTURE: NO GROWTH
Culture: NO GROWTH
Special Requests: ADEQUATE
Special Requests: ADEQUATE

## 2016-08-25 LAB — LACTIC ACID, PLASMA: Lactic Acid, Venous: 0.9 mmol/L (ref 0.5–1.9)

## 2016-08-25 MED ORDER — FUROSEMIDE 10 MG/ML IJ SOLN
40.0000 mg | Freq: Two times a day (BID) | INTRAMUSCULAR | Status: DC
  Administered 2016-08-25 – 2016-08-26 (×2): 40 mg via INTRAVENOUS
  Filled 2016-08-25 (×2): qty 4

## 2016-08-25 NOTE — Progress Notes (Signed)
[facility] PROGRESS NOTE  [name] [name] RJJ:[id number] DOB: 06-13-1970 DOA: 08/20/2016  PCP: No PCP Per Patient  Brief History/Interval Summary: 46 year old African-American male with a past medical history of polysubstance abuse, untreated hypertension, presented with one-week history of worsening dyspnea and cough. Also mentioned chest tightness. He had elevated d-dimer. He underwent CT scan which did not show any PE but showed findings concerning for pneumonia. Bilateral pleural effusions were also noted. He was also found to have elevated BNP. Lactic acid level was elevated along with troponin. Cardiology was consulted.  Reason for Visit: Acute systolic CHF  Consultants: Cardiology  Procedures:  Transthoracic echocardiogram Study Conclusions - Left ventricle: The cavity size was normal. Wall thickness was   normal. Systolic function was severely reduced. The estimated   ejection fraction was 15%. Diffuse hypokinesis. The study is not   technically sufficient to allow evaluation of LV diastolic   function. - Aortic valve: Trileaflet. Sclerosis without stenosis. There was   no significant regurgitation. - Mitral valve: Mildly thickened leaflets . There was moderate   regurgitation. - Left atrium: Moderately dilated. - Right ventricle: The cavity size was mildly dilated. Systolic   function is mildly reduced. - Right atrium: The atrium was mildly dilated. - Inferior vena cava: The vessel was dilated. The respirophasic   diameter changes were blunted (< 50%), consistent with elevated   central venous pressure.  Impressions:  - LVEF 15%, severe global hypokinesis, normal wall thickness,   aortic valve sclerosis, moderate MR, moderately dilated LA, mild   RAE, mild RV systolic dysfunction, dilated IVC.  Antibiotics: Ceftriaxone and azithromycin  Subjective/Interval History: No chest pain today or sob. No nausea, vomiting. Knows the plan , of possible R/L cath on  Monday. No new questions.   ROS: Denies any headaches.  Objective:  Vital Signs  Vitals:   08/25/16 0800 08/25/16 0910 08/25/16 1233 08/25/16 1400  BP: 100/70  104/75 110/70  Pulse: 90  89 88  Resp:   20   Temp:   98 F (36.7 C)   TempSrc:   Oral   SpO2:  99% 98%   Weight:      Height:        Intake/Output Summary (Last 24 hours) at 08/25/16 1711 Last data filed at 08/25/16 1424  Gross per 24 hour  Intake           789.01 ml  Output             2575 ml  Net         -1785.99 ml   Filed Weights   08/23/16 0524 08/24/16 0413 08/25/16 0542  Weight: 91.8 kg (202 lb 6.4 oz) 90.6 kg (199 lb 12.8 oz) 87.6 kg (193 lb 1.6 oz)    General appearance: alert, cooperative, appears stated age and no distress on RA.  Resp: Diminished air entry at the bases. No crackles. No rhonchi or wheezing. Cardio: regular rate and rhythm, S1, S2 normal, no murmur, click, rub or gallop GI: soft, non-tender; bowel sounds normal; no masses,  no organomegaly Extremities: extremities normal, atraumatic, no cyanosis or edema Neurologic: Awake and alert. Oriented 3. No focal neurological deficits are noted.  Lab Results:  Data Reviewed: I have personally reviewed following labs and imaging studies  CBC:  Recent Labs Lab 08/20/16 1131 08/22/16 0535 08/23/16 0332 08/23/16 0954  WBC 6.3 8.4 6.9 5.9  HGB 14.7 14.5 13.7 13.9  HCT 43.4 42.7 41.2 40.9  MCV 102.6* 102.4* 102.0* 102.5*  PLT 297 274 251 829    Basic Metabolic Panel:  Recent Labs Lab 08/20/16 1131 08/22/16 0535 08/23/16 0332 08/24/16 0510 08/24/16 0818 08/25/16 0449  NA 140 137 136 136  --  136  K 3.8 3.6 3.4* 3.2*  --  3.7  CL 108 105 105 102  --  100*  CO2 21* 21* 21* 24  --  26  GLUCOSE 108* 105* 99 126*  --  102*  BUN 6 11 11 9   --  9  CREATININE 0.96 1.04 1.00 1.06  --  1.00  CALCIUM 9.0 8.6* 8.6* 8.5*  --  8.4*  MG  --   --   --   --  1.5* 1.8    GFR: Estimated Creatinine Clearance: 113.2 mL/min (by C-G  formula based on SCr of 1 mg/dL).  Liver Function Tests:  Recent Labs Lab 08/22/16 0535  AST 39  ALT 32  ALKPHOS 66  BILITOT 0.6  PROT 6.2*  ALBUMIN 3.2*   Coagulation Profile:  Recent Labs Lab 08/20/16 2308  INR 1.34    Cardiac Enzymes:  Recent Labs Lab 08/20/16 1931 08/21/16 0150 08/21/16 0731  TROPONINI 0.07* 0.06* 0.06*   Thyroid Function Tests:  Recent Labs  08/23/16 1514  TSH 3.210    Anemia Panel: No results for input(s): VITAMINB12, FOLATE, FERRITIN, TIBC, IRON, RETICCTPCT in the last 72 hours.  Recent Results (from the past 240 hour(s))  Culture, blood (routine x 2) Call MD if unable to obtain prior to antibiotics being given     Status: None   Collection Time: 08/20/16  8:00 PM  Result Value Ref Range Status   Specimen Description BLOOD LEFT FOREARM  Final   Special Requests IN PEDIATRIC BOTTLE Blood Culture adequate volume  Final   Culture NO GROWTH 5 DAYS  Final   Report Status 08/25/2016 FINAL  Final  Culture, blood (routine x 2) Call MD if unable to obtain prior to antibiotics being given     Status: None   Collection Time: 08/20/16  8:15 PM  Result Value Ref Range Status   Specimen Description BLOOD RIGHT HAND  Final   Special Requests   Final    BOTTLES DRAWN AEROBIC ONLY Blood Culture adequate volume   Culture NO GROWTH 5 DAYS  Final   Report Status 08/25/2016 FINAL  Final  MRSA PCR Screening     Status: Abnormal   Collection Time: 08/21/16 12:37 AM  Result Value Ref Range Status   MRSA by PCR (A) NEGATIVE Final    INVALID, UNABLE TO DETERMINE THE PRESENCE OF TARGET DNA DUE TO SPECIMEN INTEGRITY. RECOLLECTION REQUESTED.    Comment: RESULT CALLED TO, READ BACK BY AND VERIFIED WITH: T POLING,RN @0745  08/21/16 MKELLY,MLT        The GeneXpert MRSA Assay (FDA approved for NASAL specimens only), is one component of a comprehensive MRSA colonization surveillance program. It is not intended to diagnose MRSA infection nor to guide  or monitor treatment for MRSA infections.   MRSA culture     Status: None   Collection Time: 08/21/16 12:37 AM  Result Value Ref Range Status   Specimen Description NASAL SWAB  Final   Special Requests NONE  Final   Culture NO MRSA DETECTED  Final   Report Status 08/23/2016 FINAL  Final      Radiology Studies: No results found.   Medications:  Scheduled: . aspirin  325 mg Oral Daily  . azithromycin  500 mg Oral QHS  .  cefTRIAXone (ROCEPHIN)  IV  1 g Intravenous Q24H  . dextromethorphan-guaiFENesin  1 tablet Oral BID  . enoxaparin (LOVENOX) injection  40 mg Subcutaneous Daily  . folic acid  1 mg Oral Daily  . furosemide  40 mg Intravenous BID  . ipratropium  0.5 mg Nebulization BID  . levalbuterol  1.25 mg Nebulization BID  . losartan  50 mg Oral Daily  . multivitamin with minerals  1 tablet Oral Daily  . potassium chloride  20 mEq Oral BID  . sodium chloride flush  10-40 mL Intracatheter Q12H  . thiamine  100 mg Oral Daily   Continuous: . milrinone 0.25 mcg/kg/min (08/25/16 0859)   HCW:[id number], LORazepam **OR** LORazepam, nitroGLYCERIN, ondansetron, oxyCODONE-acetaminophen, sodium chloride flush  Assessment/Plan:  Principal Problem:   Acute respiratory failure with hypoxia (HCC) Active Problems:   Essential hypertension   Major depressive disorder, recurrent severe without psychotic features ([place])   Chest pain   Alcohol use   Elevated troponin   Cocaine abuse   Right lower lobe pneumonia (HCC)   Acute on chronic combined systolic and diastolic CHF (congestive heart failure) (HCC)   Positive D dimer   Hypokalemia   Macrocytosis    Acute respiratory failure with hypoxia  Likely secondary to community acquired pneumonia and volume overload with pleural effusions. Patient started on IV lasix, milrinone and currently on RA, with good sats.    Acute systolic and diastolic CHF / Small to moderate bilateral pleural effusions  Cardiology was consulted.  Echocardiogram showed severely decreased LV function with EF of 15%. Patient does have a history of cocaine abuse. He may need ischemic workup, hence plan for right and left cath on Monday. Patient was started on milrinone by cardiology. PICC line had to be placed.  Right lower lobe pneumonia CXR and CT scan showed patchy right lower lobe infiltrate, concerning for pneumonia. Started on azithro, rocephin and flagyl. Flagyl was discontinued. Follow-up on cultures which are negative so far. HIV is nonreactive. Repeat CXR recommended in 3-4 weeks following trial of antibiotic therapy to ensure resolution and exclude underlying malignancy. Appears to be improving.   Lactic acidosis Lactic acid was elevated at 4.2. No evidence for sepsis. Secondary to heart failure.   Hypokalemia and hypomagnesemia Supplemented today   Positive D Dimer D dimer 1.85 on admission. CT angio chest showed no PE.  Cocaine induced chest pain / Troponin elevation Troponin mildly elevated, 0.06 x 2. Likely demand ischemia from cocaine abuse. The 12 lead EKG showed ST, LVH and lateral TWI which was not present in 2011. ECHO showed EF 15% and severe global hypokinesis. Continue aspirin. LDL 102. Further workup per cardiology.  Essential hypertension Chronic, untreated due to nonadherence. Holding beta blocker due to cocaine abuse. Started norvasc 5 mg and hydralazine PRN. Well controlled.   Depression Stable, no suicidal or homicidal ideations. Not taking meds at home now.  Alcohol/polysubstance abuse Monitor on CIWA protocol. UDS positive for cocaine and THC. No signs of withdrawal.   Macrocytosis Without anemia, likely related to alcohol abuse. TSH, B12, folate normal   DVT Prophylaxis: Lovenox    Code Status: Full code  Family Communication: Discussed with the patient , no family at bedside.  Disposition Plan: pending cath on Monday.     LOS: 4 days   [facility] Pager  [phone] 08/25/2016, 5:11 PM  If 7PM-7AM, please contact night-coverage at [email or web address], password [email or web address]

## 2016-08-25 NOTE — Progress Notes (Signed)
Pt is ambulatory in a hallway couple of times, Milrinone continue @ 6.61ml/hr, I/O maintained and recorded,Vitals stable, no any complain of pain and SOB and distress, will continue to monitor the patient

## 2016-08-25 NOTE — Progress Notes (Signed)
Progress Note  Patient Name: DAYMEON FISCHMAN Date of Encounter: 08/25/2016  Primary Cardiologist: New  Subjective   Improved breathing but severe SOB when just going to the bathrrom.  Inpatient Medications    Scheduled Meds: . aspirin  325 mg Oral Daily  . azithromycin  500 mg Oral QHS  . cefTRIAXone (ROCEPHIN)  IV  1 g Intravenous Q24H  . dextromethorphan-guaiFENesin  1 tablet Oral BID  . enoxaparin (LOVENOX) injection  40 mg Subcutaneous Daily  . folic acid  1 mg Oral Daily  . furosemide  80 mg Intravenous BID  . ipratropium  0.5 mg Nebulization BID  . levalbuterol  1.25 mg Nebulization BID  . losartan  50 mg Oral Daily  . multivitamin with minerals  1 tablet Oral Daily  . potassium chloride  20 mEq Oral BID  . sodium chloride flush  10-40 mL Intracatheter Q12H  . thiamine  100 mg Oral Daily   Continuous Infusions: . milrinone 0.25 mcg/kg/min (08/25/16 0859)   PRN Meds: hydrALAZINE, LORazepam **OR** LORazepam, nitroGLYCERIN, ondansetron, oxyCODONE-acetaminophen, sodium chloride flush   Vital Signs    Vitals:   08/24/16 1948 08/24/16 2225 08/25/16 0542 08/25/16 0910  BP: 110/86  (!) 113/91   Pulse: (!) 115  (!) 111   Resp: 18  18   Temp: 98.3 F (36.8 C)  97.5 F (36.4 C)   TempSrc: Oral  Oral   SpO2: 99% 99% 99% 99%  Weight:   193 lb 1.6 oz (87.6 kg)   Height:        Intake/Output Summary (Last 24 hours) at 08/25/16 1005 Last data filed at 08/25/16 0900  Gross per 24 hour  Intake           618.01 ml  Output             4325 ml  Net         -3706.99 ml   Filed Weights   08/23/16 0524 08/24/16 0413 08/25/16 0542  Weight: 202 lb 6.4 oz (91.8 kg) 199 lb 12.8 oz (90.6 kg) 193 lb 1.6 oz (87.6 kg)    Telemetry    ST 104-115 BPM   - Personally Reviewed  ECG    Physical Exam  No CP   GEN: No acute distress.   Neck: JVP is increased   Cardiac: RRR, no murmurs, rubs, or gallops.  Respiratory: Clear to auscultation bilaterally. GI: Mild RUQ  tenderness   MS: No edema; No deformity. Neuro:  Nonfocal  Psych: Normal affect   Labs    Chemistry  Recent Labs Lab 08/22/16 0535 08/23/16 0332 08/24/16 0510 08/25/16 0449  NA 137 136 136 136  K 3.6 3.4* 3.2* 3.7  CL 105 105 102 100*  CO2 21* 21* 24 26  GLUCOSE 105* 99 126* 102*  BUN 11 11 9 9   CREATININE 1.04 1.00 1.06 1.00  CALCIUM 8.6* 8.6* 8.5* 8.4*  PROT 6.2*  --   --   --   ALBUMIN 3.2*  --   --   --   AST 39  --   --   --   ALT 32  --   --   --   ALKPHOS 66  --   --   --   BILITOT 0.6  --   --   --   GFRNONAA >60 >60 >60 >60  GFRAA >60 >60 >60 >60  ANIONGAP 11 10 10 10      Hematology  Recent Labs Lab 08/22/16  9432 08/23/16 0332 08/23/16 0954  WBC 8.4 6.9 5.9  RBC 4.17* 4.04* 3.99*  HGB 14.5 13.7 13.9  HCT 42.7 41.2 40.9  MCV 102.4* 102.0* 102.5*  MCH 34.8* 33.9 34.8*  MCHC 34.0 33.3 34.0  RDW 13.4 12.8 12.8  PLT 274 251 257    Cardiac Enzymes  Recent Labs Lab 08/20/16 1931 08/21/16 0150 08/21/16 0731  TROPONINI 0.07* 0.06* 0.06*     Recent Labs Lab 08/20/16 1154  TROPIPOC 0.05     BNP  Recent Labs Lab 08/20/16 2004  BNP 1,729.9*     DDimer   Recent Labs Lab 08/20/16 1428  DDIMER 1.85*     Radiology    No results found.  Cardiac Studies     Patient Profile     46 y.o. male hx of resp distress, polysubstance abuse, Now with CHF   Assessment & Plan    1  Acute systolic CHF   Coox improved with milrinone  However O2 sats 65 from 85 yesterday. Weight down 15 lbs (207 on 4/2 --> 193 today), I will decrease lasix to 40 mg iv BID. Add carvedilol 3.125 mg po BID. R/L heart cath on MOnday.  If not able then plan for Monday Keep on milrinone for now.  2  Polysubstance use  Counselled on cessation of cocaine and EtOH (drinks 4 beers per day)  3  HTN  Diastolic intermitt increase  On losartan now    Signed, Ena Dawley, MD  08/25/2016, 10:05 AM

## 2016-08-26 LAB — COOXEMETRY PANEL
Carboxyhemoglobin: 0.6 % (ref 0.5–1.5)
Methemoglobin: 0.9 % (ref 0.0–1.5)
O2 Saturation: 57 %
TOTAL HEMOGLOBIN: 16.5 g/dL — AB (ref 12.0–16.0)

## 2016-08-26 LAB — BASIC METABOLIC PANEL
Anion gap: 10 (ref 5–15)
BUN: 8 mg/dL (ref 6–20)
CALCIUM: 8.7 mg/dL — AB (ref 8.9–10.3)
CO2: 26 mmol/L (ref 22–32)
Chloride: 100 mmol/L — ABNORMAL LOW (ref 101–111)
Creatinine, Ser: 0.96 mg/dL (ref 0.61–1.24)
GFR calc Af Amer: >60 mL/min (ref >60)
GLUCOSE: 119 mg/dL — AB (ref 65–99)
POTASSIUM: 3.7 mmol/L (ref 3.5–5.1)
SODIUM: 136 mmol/L (ref 135–145)

## 2016-08-26 MED ORDER — CARVEDILOL 3.125 MG PO TABS
3.1250 mg | ORAL_TABLET | Freq: Two times a day (BID) | ORAL | Status: DC
  Administered 2016-08-26 – 2016-08-28 (×4): 3.125 mg via ORAL
  Filled 2016-08-26 (×4): qty 1

## 2016-08-26 NOTE — Progress Notes (Addendum)
[facility] PROGRESS NOTE  BRANDYN LOWREY ZDG:[id number] DOB: 04-Aug-1970 DOA: 08/20/2016  PCP: No PCP Per Patient  Brief History/Interval Summary: 46 year old African-American male with a past medical history of polysubstance abuse, untreated hypertension, presented with one-week history of worsening dyspnea and cough. Also mentioned chest tightness. He had elevated d-dimer. He underwent CT scan which did not show any PE but showed findings concerning for pneumonia. Bilateral pleural effusions were also noted. He was also found to have elevated BNP. Lactic acid level was elevated along with troponin. Cardiology was consulted. Patient was started on milrinone infusion. Plan is for cardiac catheterization on 4/9.  Reason for Visit: Acute systolic CHF  Consultants: Cardiology  Procedures:  Transthoracic echocardiogram Study Conclusions - Left ventricle: The cavity size was normal. Wall thickness was   normal. Systolic function was severely reduced. The estimated   ejection fraction was 15%. Diffuse hypokinesis. The study is not   technically sufficient to allow evaluation of LV diastolic   function. - Aortic valve: Trileaflet. Sclerosis without stenosis. There was   no significant regurgitation. - Mitral valve: Mildly thickened leaflets . There was moderate   regurgitation. - Left atrium: Moderately dilated. - Right ventricle: The cavity size was mildly dilated. Systolic   function is mildly reduced. - Right atrium: The atrium was mildly dilated. - Inferior vena cava: The vessel was dilated. The respirophasic   diameter changes were blunted (< 50%), consistent with elevated   central venous pressure.  Impressions:  - LVEF 15%, severe global hypokinesis, normal wall thickness,   aortic valve sclerosis, moderate MR, moderately dilated LA, mild   RAE, mild RV systolic dysfunction, dilated IVC.  Antibiotics: Ceftriaxone and azithromycin  Subjective/Interval  History: Patient states that his chest pain is almost resolved. Shortness of breath significantly improved. He is able to ambulate without much difficulty.   ROS: Denies any nausea or vomiting  Objective:  Vital Signs  Vitals:   08/25/16 1926 08/25/16 2022 08/26/16 0000 08/26/16 0530  BP:  (!) 118/95  111/87  Pulse:  (!) 110 (!) 0 (!) 110  Resp:  18  18  Temp:  98.3 F (36.8 C)  98.1 F (36.7 C)  TempSrc:  Oral  Oral  SpO2: 98% 98%  98%  Weight:    86.7 kg (191 lb 3.2 oz)  Height:        Intake/Output Summary (Last 24 hours) at 08/26/16 0750 Last data filed at 08/26/16 0500  Gross per 24 hour  Intake             1042 ml  Output             2825 ml  Net            -1783 ml   Filed Weights   08/24/16 0413 08/25/16 0542 08/26/16 0530  Weight: 90.6 kg (199 lb 12.8 oz) 87.6 kg (193 lb 1.6 oz) 86.7 kg (191 lb 3.2 oz)    General appearance: alert, cooperative, appears stated age and no distress  Resp: Improved air entry laterally. No definite crackles. No wheezing or rhonchi.  Cardio: regular rate and rhythm, S1, S2 normal, no murmur, click, rub or gallop GI: soft, non-tender; bowel sounds normal; no masses,  no organomegaly Extremities: extremities normal, atraumatic, no cyanosis or edema Neurologic: Awake and alert. Oriented 3. No focal neurological deficits are noted.  Lab Results:  Data Reviewed: I have personally reviewed following labs and imaging studies  CBC:  Recent Labs Lab 08/20/16 1131  08/22/16 0535 08/23/16 0332 08/23/16 0954  WBC 6.3 8.4 6.9 5.9  HGB 14.7 14.5 13.7 13.9  HCT 43.4 42.7 41.2 40.9  MCV 102.6* 102.4* 102.0* 102.5*  PLT 297 274 251 259    Basic Metabolic Panel:  Recent Labs Lab 08/22/16 0535 08/23/16 0332 08/24/16 0510 08/24/16 0818 08/25/16 0449 08/26/16 0512  NA 137 136 136  --  136 136  K 3.6 3.4* 3.2*  --  3.7 3.7  CL 105 105 102  --  100* 100*  CO2 21* 21* 24  --  26 26  GLUCOSE 105* 99 126*  --  102* 119*  BUN 11 11  9   --  9 8  CREATININE 1.04 1.00 1.06  --  1.00 0.96  CALCIUM 8.6* 8.6* 8.5*  --  8.4* 8.7*  MG  --   --   --  1.5* 1.8  --     GFR: Estimated Creatinine Clearance: 116.7 mL/min (by C-G formula based on SCr of 0.96 mg/dL).  Liver Function Tests:  Recent Labs Lab 08/22/16 0535  AST 39  ALT 32  ALKPHOS 66  BILITOT 0.6  PROT 6.2*  ALBUMIN 3.2*   Coagulation Profile:  Recent Labs Lab 08/20/16 2308  INR 1.34    Cardiac Enzymes:  Recent Labs Lab 08/20/16 1931 08/21/16 0150 08/21/16 0731  TROPONINI 0.07* 0.06* 0.06*   Thyroid Function Tests:  Recent Labs  08/23/16 1514  TSH 3.210     Recent Results (from the past 240 hour(s))  Culture, blood (routine x 2) Call MD if unable to obtain prior to antibiotics being given     Status: None   Collection Time: 08/20/16  8:00 PM  Result Value Ref Range Status   Specimen Description BLOOD LEFT FOREARM  Final   Special Requests IN PEDIATRIC BOTTLE Blood Culture adequate volume  Final   Culture NO GROWTH 5 DAYS  Final   Report Status 08/25/2016 FINAL  Final  Culture, blood (routine x 2) Call MD if unable to obtain prior to antibiotics being given     Status: None   Collection Time: 08/20/16  8:15 PM  Result Value Ref Range Status   Specimen Description BLOOD RIGHT HAND  Final   Special Requests   Final    BOTTLES DRAWN AEROBIC ONLY Blood Culture adequate volume   Culture NO GROWTH 5 DAYS  Final   Report Status 08/25/2016 FINAL  Final  MRSA PCR Screening     Status: Abnormal   Collection Time: 08/21/16 12:37 AM  Result Value Ref Range Status   MRSA by PCR (A) NEGATIVE Final    INVALID, UNABLE TO DETERMINE THE PRESENCE OF TARGET DNA DUE TO SPECIMEN INTEGRITY. RECOLLECTION REQUESTED.    Comment: RESULT CALLED TO, READ BACK BY AND VERIFIED WITH: T POLING,RN @0745  08/21/16 MKELLY,MLT        The GeneXpert MRSA Assay (FDA approved for NASAL specimens only), is one component of a comprehensive MRSA  colonization surveillance program. It is not intended to diagnose MRSA infection nor to guide or monitor treatment for MRSA infections.   MRSA culture     Status: None   Collection Time: 08/21/16 12:37 AM  Result Value Ref Range Status   Specimen Description NASAL SWAB  Final   Special Requests NONE  Final   Culture NO MRSA DETECTED  Final   Report Status 08/23/2016 FINAL  Final      Radiology Studies: No results found.   Medications:  Scheduled: . aspirin  325 mg Oral Daily  . azithromycin  500 mg Oral QHS  . cefTRIAXone (ROCEPHIN)  IV  1 g Intravenous Q24H  . dextromethorphan-guaiFENesin  1 tablet Oral BID  . enoxaparin (LOVENOX) injection  40 mg Subcutaneous Daily  . folic acid  1 mg Oral Daily  . furosemide  40 mg Intravenous BID  . losartan  50 mg Oral Daily  . multivitamin with minerals  1 tablet Oral Daily  . potassium chloride  20 mEq Oral BID  . sodium chloride flush  10-40 mL Intracatheter Q12H  . thiamine  100 mg Oral Daily   Continuous: . milrinone 0.25 mcg/kg/min (08/26/16 0719)   RSW:[id number], LORazepam **OR** LORazepam, nitroGLYCERIN, ondansetron, oxyCODONE-acetaminophen, sodium chloride flush  Assessment/Plan:  Principal Problem:   Acute respiratory failure with hypoxia (HCC) Active Problems:   Essential hypertension   Major depressive disorder, recurrent severe without psychotic features ([place])   Chest pain   Alcohol use   Elevated troponin   Cocaine abuse   Right lower lobe pneumonia (HCC)   Acute on chronic combined systolic and diastolic CHF (congestive heart failure) (HCC)   Positive D dimer   Hypokalemia   Macrocytosis    Acute respiratory failure with hypoxia  Likely secondary to community acquired pneumonia and volume overload with pleural effusions. Patient started on IV lasix, milrinone and currently on RA, with good sats.   Acute systolic and diastolic CHF / Small to moderate bilateral pleural effusions  Cardiology was  consulted. Echocardiogram showed severely decreased LV function with EF of 15%. Patient does have a history of cocaine abuse. He may need ischemic workup, hence plan for right and left cath on Monday. Patient was started on milrinone by cardiology. PICC line had to be placed.His weight has decreased. He has diuresed well. Continues to be on iv furosemide.  Cocaine induced chest pain / Troponin elevation Troponin mildly elevated, 0.06 x 2. Likely demand ischemia from cocaine abuse. The 12 lead EKG showed ST, LVH and lateral TWI which was not present in 2011. ECHO showed EF 15% and severe global hypokinesis. Continue aspirin. LDL 102. Further workup per cardiology. Consider statin at discharge.  Right lower lobe pneumonia CXR and CT scan showed patchy right lower lobe infiltrate, concerning for pneumonia. Started on azithro, rocephin and flagyl. Flagyl was discontinued. Follow-up on cultures which are negative so far. HIV is nonreactive. Repeat CXR recommended in 3-4 weeks following trial of antibiotic therapy to ensure resolution and exclude underlying malignancy. Appears to be improving. Discontinue antibiotics after 7 days of treatment (end date mentioned in the orders).  Lactic acidosis Lactic acid was elevated at 4.2. No evidence for sepsis. Secondary to heart failure.   Hypokalemia and hypomagnesemia Supplemented  Positive D Dimer D dimer 1.85 on admission. CT angio chest showed no PE.  Essential hypertension Chronic, untreated due to nonadherence. Holding beta blocker due to cocaine abuse. Was initially started on norvasc 5 mg and PRN  hydralazine. Now only on losartan.   Depression Stable, no suicidal or homicidal ideations. Not taking meds at home now.  Alcohol/polysubstance abuse Monitor on CIWA protocol. UDS positive for cocaine and THC. No signs of withdrawal.   Macrocytosis Without anemia, likely related to alcohol abuse. TSH, B12, folate normal   DVT Prophylaxis:  Lovenox    Code Status: Full code  Family Communication: Discussed with the patient.  Disposition Plan: pending cath on Monday. Management as outlined above.    LOS: 5 days   [facility] Pager  [phone] 08/26/2016, 7:50 AM  If 7PM-7AM, please contact night-coverage at [email or web address], password [facility]

## 2016-08-26 NOTE — Progress Notes (Signed)
Progress Note  Patient Name: [name] [name] Date of Encounter: 08/26/2016  Primary Cardiologist: New  Subjective   Improved breathing since yesterday, he was able to walk this am.  Inpatient Medications    Scheduled Meds: . aspirin  325 mg Oral Daily  . azithromycin  500 mg Oral QHS  . cefTRIAXone (ROCEPHIN)  IV  1 g Intravenous Q24H  . dextromethorphan-guaiFENesin  1 tablet Oral BID  . enoxaparin (LOVENOX) injection  40 mg Subcutaneous Daily  . folic acid  1 mg Oral Daily  . furosemide  40 mg Intravenous BID  . losartan  50 mg Oral Daily  . multivitamin with minerals  1 tablet Oral Daily  . potassium chloride  20 mEq Oral BID  . sodium chloride flush  10-40 mL Intracatheter Q12H  . thiamine  100 mg Oral Daily   Continuous Infusions: . milrinone 0.25 mcg/kg/min (08/26/16 0719)   PRN Meds: hydrALAZINE, LORazepam **OR** LORazepam, nitroGLYCERIN, ondansetron, oxyCODONE-acetaminophen, sodium chloride flush   Vital Signs    Vitals:   08/25/16 1926 08/25/16 2022 08/26/16 0000 08/26/16 0530  BP:  (!) 118/95  111/87  Pulse:  (!) 110 (!) 0 (!) 110  Resp:  18  18  Temp:  98.3 F (36.8 C)  98.1 F (36.7 C)  TempSrc:  Oral  Oral  SpO2: 98% 98%  98%  Weight:    191 lb 3.2 oz (86.7 kg)  Height:        Intake/Output Summary (Last 24 hours) at 08/26/16 0948 Last data filed at 08/26/16 0925  Gross per 24 hour  Intake             1042 ml  Output             2325 ml  Net            -1283 ml   Filed Weights   08/24/16 0413 08/25/16 0542 08/26/16 0530  Weight: 199 lb 12.8 oz (90.6 kg) 193 lb 1.6 oz (87.6 kg) 191 lb 3.2 oz (86.7 kg)    Telemetry    ST 104-115 BPM   - Personally Reviewed  ECG    Physical Exam  No CP   GEN: No acute distress.   Neck: JVP is increased   Cardiac: RRR, no murmurs, rubs, or gallops.  Respiratory: Clear to auscultation bilaterally. GI: Mild RUQ tenderness   MS: No edema; No deformity. Neuro:  Nonfocal  Psych: Normal affect   Labs      Chemistry  Recent Labs Lab 08/22/16 0535  08/24/16 0510 08/25/16 0449 08/26/16 0512  NA 137  < > 136 136 136  K 3.6  < > 3.2* 3.7 3.7  CL 105  < > 102 100* 100*  CO2 21*  < > 24 26 26   GLUCOSE 105*  < > 126* 102* 119*  BUN 11  < > 9 9 8   CREATININE 1.04  < > 1.06 1.00 0.96  CALCIUM 8.6*  < > 8.5* 8.4* 8.7*  PROT 6.2*  --   --   --   --   ALBUMIN 3.2*  --   --   --   --   AST 39  --   --   --   --   ALT 32  --   --   --   --   ALKPHOS 66  --   --   --   --   BILITOT 0.6  --   --   --   --  GFRNONAA >60  < > >60 >60 >60  GFRAA >60  < > >60 >60 >60  ANIONGAP 11  < > 10 10 10   < > = values in this interval not displayed.   Hematology  Recent Labs Lab 08/22/16 0535 08/23/16 0332 08/23/16 0954  WBC 8.4 6.9 5.9  RBC 4.17* 4.04* 3.99*  HGB 14.5 13.7 13.9  HCT 42.7 41.2 40.9  MCV 102.4* 102.0* 102.5*  MCH 34.8* 33.9 34.8*  MCHC 34.0 33.3 34.0  RDW 13.4 12.8 12.8  PLT 274 251 257    Cardiac Enzymes  Recent Labs Lab 08/20/16 1931 08/21/16 0150 08/21/16 0731  TROPONINI 0.07* 0.06* 0.06*     Recent Labs Lab 08/20/16 1154  TROPIPOC 0.05     BNP  Recent Labs Lab 08/20/16 2004 08/25/16 1229  BNP 1,729.9* 1,067.1*     DDimer   Recent Labs Lab 08/20/16 1428  DDIMER 1.85*     Radiology    No results found.  Cardiac Studies   TTE: 08/22/3016 - Left ventricle: The cavity size was normal. Wall thickness was   normal. Systolic function was severely reduced. The estimated   ejection fraction was 15%. Diffuse hypokinesis. The study is not   technically sufficient to allow evaluation of LV diastolic   function. - Aortic valve: Trileaflet. Sclerosis without stenosis. There was   no significant regurgitation. - Mitral valve: Mildly thickened leaflets . There was moderate   regurgitation. - Left atrium: Moderately dilated. - Right ventricle: The cavity size was mildly dilated. Systolic   function is mildly reduced. - Right atrium: The atrium was  mildly dilated. - Inferior vena cava: The vessel was dilated. The respirophasic   diameter changes were blunted (< 50%), consistent with elevated   central venous pressure.  Impressions: - LVEF 15%, severe global hypokinesis, normal wall thickness,   aortic valve sclerosis, moderate MR, moderately dilated LA, mild   RAE, mild RV systolic dysfunction, dilated IVC.    Patient Profile     46 y.o. male hx of resp distress, polysubstance abuse, Now with CHF   Assessment & Plan    1  Acute systolic CHF   Coox improved with milrinone  However O2 sats 89-->65--> 57. Weight down 15 lbs (207 on 4/2 --> 191 today), I will hold lasix today. Added carvedilol 3.125 mg po BID. R/L heart cath on MOnday.   Keep on milrinone for now.  2  Polysubstance use  Counselled on cessation of cocaine and EtOH (drinks 4 beers per day)  3  HTN  Diastolic intermitt increase  On losartan now    Signed, Ena Dawley, MD  08/26/2016, 9:48 AM

## 2016-08-26 NOTE — Progress Notes (Signed)
Pt is ambulatory in a hallway couple of times, Milrinone continue @ 6.90ml/hr, I/O maintained and recorded,Vitals stable, no any complain of pain and SOB and distress, scheduled for Cath tomorrow, consent has been signed, clipping is due, pt is aware about the procedure and NPO after midnight, watching cath video now, will continue to monitor the patient

## 2016-08-27 ENCOUNTER — Encounter (HOSPITAL_COMMUNITY): Payer: Self-pay | Admitting: Cardiovascular Disease

## 2016-08-27 ENCOUNTER — Encounter (HOSPITAL_COMMUNITY)
Admission: EM | Disposition: A | Discharge status: Home / Self Care | Payer: Self-pay | Source: Home / Self Care | Attending: Internal Medicine

## 2016-08-27 HISTORY — PX: RIGHT/LEFT HEART CATH AND CORONARY ANGIOGRAPHY: CATH118266

## 2016-08-27 LAB — BASIC METABOLIC PANEL
ANION GAP: 8 (ref 5–15)
Anion gap: 6 (ref 5–15)
BUN: 10 mg/dL (ref 6–20)
BUN: 8 mg/dL (ref 6–20)
CALCIUM: 9.3 mg/dL (ref 8.9–10.3)
CO2: 26 mmol/L (ref 22–32)
CO2: 26 mmol/L (ref 22–32)
CREATININE: 0.95 mg/dL (ref 0.61–1.24)
Calcium: 8.9 mg/dL (ref 8.9–10.3)
Chloride: 102 mmol/L (ref 101–111)
Chloride: 103 mmol/L (ref 101–111)
Creatinine, Ser: 0.99 mg/dL (ref 0.61–1.24)
GFR calc Af Amer: >60 mL/min (ref >60)
GFR calc non Af Amer: >60 mL/min (ref >60)
Glucose, Bld: 97 mg/dL (ref 65–99)
Glucose, Bld: 96 mg/dL (ref 65–99)
Potassium: 4.5 mmol/L (ref 3.5–5.1)
Potassium: 4.7 mmol/L (ref 3.5–5.1)
SODIUM: 136 mmol/L (ref 135–145)
Sodium: 135 mmol/L (ref 135–145)

## 2016-08-27 LAB — POCT I-STAT 3, ART BLOOD GAS (G3+)
ACID-BASE DEFICIT: 2 mmol/L (ref 0.0–2.0)
BICARBONATE: 23.8 mmol/L (ref 20.0–28.0)
O2 SAT: 98 %
PO2 ART: 108 mmHg (ref 83.0–108.0)
TCO2: 25 mmol/L (ref 0–100)
pCO2 arterial: 44 mmHg (ref 32.0–48.0)
pH, Arterial: 7.34 — ABNORMAL LOW (ref 7.350–7.450)

## 2016-08-27 LAB — CBC
HCT: 48.9 % (ref 39.0–52.0)
Hemoglobin: 16.7 g/dL (ref 13.0–17.0)
MCH: 35.2 pg — AB (ref 26.0–34.0)
MCHC: 34.2 g/dL (ref 30.0–36.0)
MCV: 102.9 fL — AB (ref 78.0–100.0)
PLATELETS: 302 10*3/uL (ref 150–400)
RBC: 4.75 MIL/uL (ref 4.22–5.81)
RDW: 12.3 % (ref 11.5–15.5)
WBC: 4 10*3/uL (ref 4.0–10.5)

## 2016-08-27 LAB — POCT I-STAT 3, VENOUS BLOOD GAS (G3P V)
Acid-base deficit: 2 mmol/L (ref 0.0–2.0)
BICARBONATE: 24 mmol/L (ref 20.0–28.0)
O2 Saturation: 72 %
PCO2 VEN: 45.6 mmHg (ref 44.0–60.0)
PH VEN: 7.329 (ref 7.250–7.430)
PO2 VEN: 41 mmHg (ref 32.0–45.0)
TCO2: 25 mmol/L (ref 0–100)

## 2016-08-27 LAB — PROTIME-INR
INR: 1.11
Prothrombin Time: 14.3 seconds (ref 11.4–15.2)

## 2016-08-27 LAB — COOXEMETRY PANEL
CARBOXYHEMOGLOBIN: 1.2 % (ref 0.5–1.5)
Methemoglobin: 0.6 % (ref 0.0–1.5)
O2 SAT: 65.8 %
TOTAL HEMOGLOBIN: 16 g/dL (ref 12.0–16.0)

## 2016-08-27 SURGERY — RIGHT/LEFT HEART CATH AND CORONARY ANGIOGRAPHY
Anesthesia: LOCAL

## 2016-08-27 MED ORDER — FENTANYL CITRATE (PF) 100 MCG/2ML IJ SOLN
INTRAMUSCULAR | Status: DC | PRN
  Administered 2016-08-27: 25 ug via INTRAVENOUS

## 2016-08-27 MED ORDER — SODIUM CHLORIDE 0.9 % IV SOLN
250.0000 mL | INTRAVENOUS | Status: DC | PRN
Start: 2016-08-27 — End: 2016-08-29

## 2016-08-27 MED ORDER — SODIUM CHLORIDE 0.9 % IV SOLN: INTRAVENOUS | Status: DC

## 2016-08-27 MED ORDER — LIDOCAINE HCL (PF) 1 % IJ SOLN
INTRAMUSCULAR | Status: DC | PRN
  Administered 2016-08-27: 30 mL via INTRADERMAL

## 2016-08-27 MED ORDER — HEPARIN (PORCINE) IN NACL 2-0.9 UNIT/ML-% IJ SOLN
INTRAMUSCULAR | Status: AC
  Filled 2016-08-27: qty 1000

## 2016-08-27 MED ORDER — ASPIRIN 81 MG PO CHEW
81.0000 mg | CHEWABLE_TABLET | ORAL | Status: AC
  Administered 2016-08-27: 81 mg via ORAL
  Filled 2016-08-27: qty 1

## 2016-08-27 MED ORDER — SODIUM CHLORIDE 0.9% FLUSH: 3.0000 mL | Freq: Two times a day (BID) | INTRAVENOUS | Status: DC

## 2016-08-27 MED ORDER — SODIUM CHLORIDE 0.9% FLUSH
3.0000 mL | Freq: Two times a day (BID) | INTRAVENOUS | Status: DC
Start: 2016-08-27 — End: 2016-08-27

## 2016-08-27 MED ORDER — ASPIRIN 325 MG PO TABS
325.0000 mg | ORAL_TABLET | Freq: Every day | ORAL | Status: DC
  Administered 2016-08-28 – 2016-08-29 (×2): 325 mg via ORAL
  Filled 2016-08-27 (×2): qty 1

## 2016-08-27 MED ORDER — LIDOCAINE HCL (PF) 1 % IJ SOLN
INTRAMUSCULAR | Status: AC
  Filled 2016-08-27: qty 30

## 2016-08-27 MED ORDER — SODIUM CHLORIDE 0.9 % IV SOLN: 250.0000 mL | INTRAVENOUS | Status: DC | PRN

## 2016-08-27 MED ORDER — SODIUM CHLORIDE 0.9% FLUSH: 3.0000 mL | INTRAVENOUS | Status: DC | PRN

## 2016-08-27 MED ORDER — IOPAMIDOL (ISOVUE-370) INJECTION 76%
INTRAVENOUS | Status: AC
  Filled 2016-08-27: qty 100

## 2016-08-27 MED ORDER — ACETAMINOPHEN 325 MG PO TABS
650.0000 mg | ORAL_TABLET | ORAL | Status: DC | PRN
Start: 2016-08-27 — End: 2016-08-29

## 2016-08-27 MED ORDER — MIDAZOLAM HCL 2 MG/2ML IJ SOLN
INTRAMUSCULAR | Status: DC | PRN
  Administered 2016-08-27: 2 mg via INTRAVENOUS

## 2016-08-27 MED ORDER — ASPIRIN 81 MG PO CHEW: 81.0000 mg | CHEWABLE_TABLET | ORAL | Status: DC

## 2016-08-27 MED ORDER — FENTANYL CITRATE (PF) 100 MCG/2ML IJ SOLN
INTRAMUSCULAR | Status: AC
  Filled 2016-08-27: qty 2

## 2016-08-27 MED ORDER — IOPAMIDOL (ISOVUE-370) INJECTION 76%
INTRAVENOUS | Status: DC | PRN
  Administered 2016-08-27: 55 mL via INTRA_ARTERIAL

## 2016-08-27 MED ORDER — SODIUM CHLORIDE 0.9 % IV SOLN
INTRAVENOUS | Status: AC
  Administered 2016-08-27: 15:00:00 via INTRAVENOUS

## 2016-08-27 MED ORDER — MIDAZOLAM HCL 2 MG/2ML IJ SOLN
INTRAMUSCULAR | Status: AC
  Filled 2016-08-27: qty 2

## 2016-08-27 MED ORDER — HEPARIN (PORCINE) IN NACL 2-0.9 UNIT/ML-% IJ SOLN
INTRAMUSCULAR | Status: DC | PRN
  Administered 2016-08-27: 1000 mL

## 2016-08-27 SURGICAL SUPPLY — 9 items
CATH INFINITI 5FR MULTPACK ANG (CATHETERS) ×2 IMPLANT
CATH SWAN GANZ 7F STRAIGHT (CATHETERS) ×2 IMPLANT
KIT HEART LEFT (KITS) ×2 IMPLANT
PACK CARDIAC CATHETERIZATION (CUSTOM PROCEDURE TRAY) ×2 IMPLANT
SHEATH PINNACLE 5F 10CM (SHEATH) ×2 IMPLANT
SHEATH PINNACLE 7F 10CM (SHEATH) ×2 IMPLANT
TRANSDUCER W/STOPCOCK (MISCELLANEOUS) ×2 IMPLANT
TUBING CIL FLEX 10 FLL-RA (TUBING) ×2 IMPLANT
WIRE EMERALD 3MM-J .035X150CM (WIRE) ×2 IMPLANT

## 2016-08-27 NOTE — Progress Notes (Signed)
Progress Note  Patient Name: [name] [name] Date of Encounter: 08/27/2016  Primary Cardiologist: New - Dr. Harrington Challenger  Subjective   Denies any chest pain or palpitations. Breathing at baseline. No remaining questions or concerns regarding upcoming cath.   Inpatient Medications    Scheduled Meds: . aspirin  81 mg Oral Pre-Cath  . [START ON 08/28/2016] aspirin  325 mg Oral Daily  . azithromycin  500 mg Oral QHS  . carvedilol  3.125 mg Oral BID WC  . dextromethorphan-guaiFENesin  1 tablet Oral BID  . enoxaparin (LOVENOX) injection  40 mg Subcutaneous Daily  . folic acid  1 mg Oral Daily  . losartan  50 mg Oral Daily  . multivitamin with minerals  1 tablet Oral Daily  . potassium chloride  20 mEq Oral BID  . sodium chloride flush  10-40 mL Intracatheter Q12H  . sodium chloride flush  3 mL Intravenous Q12H  . thiamine  100 mg Oral Daily   Continuous Infusions: . sodium chloride    . milrinone 0.25 mcg/kg/min (08/26/16 1815)   PRN Meds: sodium chloride, hydrALAZINE, nitroGLYCERIN, ondansetron, oxyCODONE-acetaminophen, sodium chloride flush, sodium chloride flush   Vital Signs    Vitals:   08/26/16 1220 08/26/16 1800 08/26/16 1955 08/27/16 0610  BP: 120/83 120/80 (!) 125/96 116/81  Pulse: (!) 115 (!) 105 (!) 117 (!) 112  Resp: 20  18 18   Temp: 98.3 F (36.8 C)  98.1 F (36.7 C) 97.9 F (36.6 C)  TempSrc: Oral  Oral Oral  SpO2: 100%  100% 100%  Weight:    192 lb 9.6 oz (87.4 kg)  Height:        Intake/Output Summary (Last 24 hours) at 08/27/16 0827 Last data filed at 08/27/16 0700  Gross per 24 hour  Intake           1198.8 ml  Output             1600 ml  Net           -401.2 ml   Filed Weights   08/25/16 0542 08/26/16 0530 08/27/16 0610  Weight: 193 lb 1.6 oz (87.6 kg) 191 lb 3.2 oz (86.7 kg) 192 lb 9.6 oz (87.4 kg)    Telemetry    Sinus tachycardia, HR 100's to 110's.  - Personally Reviewed  ECG    No new tracings.   Physical Exam   General: Well  developed, well nourished African American male appearing in no acute distress. Head: Normocephalic, atraumatic.  Neck: Supple without bruits, JVD at 8cm. Lungs:  Resp regular and unlabored, CTA without wheezing or rales. Heart: Regular rhythm, tachycardiac rate. S1, S2, no S3, S4, or murmur; no rub. Abdomen: Soft, non-tender, non-distended with normoactive bowel sounds. No hepatomegaly. No rebound/guarding. No obvious abdominal masses. Extremities: No clubbing, cyanosis, or edema. Distal pedal pulses are 2+ bilaterally. Neuro: Alert and oriented X 3. Moves all extremities spontaneously. Psych: Normal affect.  Labs    Chemistry Recent Labs Lab 08/22/16 0535  08/25/16 0449 08/26/16 0512 08/27/16 0347  NA 137  < > 136 136 136  K 3.6  < > 3.7 3.7 4.5  CL 105  < > 100* 100* 102  CO2 21*  < > 26 26 26   GLUCOSE 105*  < > 102* 119* 97  BUN 11  < > 9 8 10   CREATININE 1.04  < > 1.00 0.96 0.99  CALCIUM 8.6*  < > 8.4* 8.7* 8.9  PROT 6.2*  --   --   --   --  ALBUMIN 3.2*  --   --   --   --   AST 39  --   --   --   --   ALT 32  --   --   --   --   ALKPHOS 66  --   --   --   --   BILITOT 0.6  --   --   --   --   GFRNONAA >60  < > >60 >60 >60  GFRAA >60  < > >60 >60 >60  ANIONGAP 11  < > 10 10 8   < > = values in this interval not displayed.   Hematology Recent Labs Lab 08/22/16 0535 08/23/16 0332 08/23/16 0954  WBC 8.4 6.9 5.9  RBC 4.17* 4.04* 3.99*  HGB 14.5 13.7 13.9  HCT 42.7 41.2 40.9  MCV 102.4* 102.0* 102.5*  MCH 34.8* 33.9 34.8*  MCHC 34.0 33.3 34.0  RDW 13.4 12.8 12.8  PLT 274 251 257    Cardiac Enzymes Recent Labs Lab 08/20/16 1931 08/21/16 0150 08/21/16 0731  TROPONINI 0.07* 0.06* 0.06*    Recent Labs Lab 08/20/16 1154  TROPIPOC 0.05     BNP Recent Labs Lab 08/20/16 2004 08/25/16 1229  BNP 1,729.9* 1,067.1*     DDimer  Recent Labs Lab 08/20/16 1428  DDIMER 1.85*     Radiology    No results found.  Cardiac Studies   Echocardiogram:  08/22/2016 Study Conclusions  - Left ventricle: The cavity size was normal. Wall thickness was   normal. Systolic function was severely reduced. The estimated   ejection fraction was 15%. Diffuse hypokinesis. The study is not   technically sufficient to allow evaluation of LV diastolic   function. - Aortic valve: Trileaflet. Sclerosis without stenosis. There was   no significant regurgitation. - Mitral valve: Mildly thickened leaflets . There was moderate   regurgitation. - Left atrium: Moderately dilated. - Right ventricle: The cavity size was mildly dilated. Systolic   function is mildly reduced. - Right atrium: The atrium was mildly dilated. - Inferior vena cava: The vessel was dilated. The respirophasic   diameter changes were blunted (< 50%), consistent with elevated   central venous pressure.  Impressions:  - LVEF 15%, severe global hypokinesis, normal wall thickness,   aortic valve sclerosis, moderate MR, moderately dilated LA, mild   RAE, mild RV systolic dysfunction, dilated IVC.  Patient Profile     46 y.o. male w/ PMH of HTN, HLD, and polysubstance use (Cocaine, THC, and alcohol) who presented to [facility] on 08/20/2016 for worsening dyspnea and chest pain, found to have a new diagnosis of acute systolic CHF with EF at 76%.   Assessment & Plan    1. Acute Systolic CHF - new diagnosis, as echo this admission shows a severely reduced LVEF at 15% with diffuse hypokinesis and moderate MR. No prior study for comparison. BNP elevated at 1729 on admission.  - On Milrinone at 0.25 mcg/kg/min. Remains on IV Lasix 40mg  BID with a recorded net output of -2.6L, however weight is down 15 lbs. O2 at 65.8 this AM.  - has been started on Coreg 3.125mg  BID and Losartan 50mg  daily. Will need to monitor the use of BB therapy closely in the setting of known cocaine use.  - plan is for right/left heart catheterization today to further assess his cardiomyopathy. Has been NPO since midnight.     2. Elevated Troponin - cyclic values have been flat at 0.06, 0.06, and 0.07.  Likely secondary to demand ischemia in the setting of acute CHF, PNA and HTN. However, given his severely reduced LVEF, he will need a LHC to exclude CAD.   3. Polysubstance Abuse - UDS positive for cocaine, and Tetrahydrocannabinol. He also admits to moderate to heavy ETOH consumption. Needs substance abuse counseling. Discontinuation of ETOH and cocaine is imperative.  4. HLD - Lipid panel shows LDL at 102 mg/dL. If LHC confirms the presence of CAD, he will need initiation of a statin with goal LDL of < 70 mg/dL.    5. Hypokalemia:  - resolved, K+ at 4.5 this AM.   6. Mitral Regurgitation - moderate by echo this admission.  - continue to follow in the outpatient setting.  7. RLL PNA - per admitting team.   Signed, Erma Heritage , PA-C 8:27 AM 08/27/2016 Pager: [phone]  Patient examined chart reviewed. CHF improved no murmur PMI increased lungs clear For right and left cath today Discussed issues of polysubstance abuse and risk to heart And ppt of sudden death. Also need to moderate quit ETOH in regard to CHF especially If found to be non ischemic   Jenkins Rouge

## 2016-08-27 NOTE — Progress Notes (Signed)
[facility] PROGRESS NOTE  [name] [name] KLK:[id number] DOB: 03-Dec-1970 DOA: 08/20/2016  PCP: No PCP Per Patient  Brief History/Interval Summary: 46 year old African-American male with a past medical history of polysubstance abuse, untreated hypertension, presented with one-week history of worsening dyspnea and cough. Also mentioned chest tightness. He had elevated d-dimer. He underwent CT scan which did not show any PE but showed findings concerning for pneumonia. Bilateral pleural effusions were also noted. He was also found to have elevated BNP. Lactic acid level was elevated along with troponin. Cardiology was consulted. Patient was started on milrinone infusion. Plan is for cardiac catheterization on 4/9.  Subjective/Interval History: No chest pain, breathing improving  Assessment/Plan:  Acute respiratory failure with hypoxia  Likely secondary to volume overload with pleural effusions, pneumonia also possible  -improved see below  Acute systolic and diastolic CHF / Small to moderate bilateral pleural effusions  Cardiology was consulted. Echocardiogram showed severely decreased LV function with EF of 15%. Patient does have a history of cocaine abuse.  -needs ischemic workup, hence plan for right and left cath today -Patient was started on milrinone by cardiology. PICC line had to be placed.His weight has decreased.  -He has diuresed well. Continues to be on iv furosemide/milrinone per Cardiology  Cocaine induced chest pain / Troponin elevation Troponin mildly elevated, 0.06 x 2. Likely demand ischemia from cocaine abuse. The 12 lead EKG showed ST, LVH and lateral TWI which was not present in 2011. ECHO showed EF 15% and severe global hypokinesis. Continue aspirin. LDL 102. Further workup per cardiology. Consider statin at discharge. -LHC today as above  Right lower lobe pneumonia CXR and CT scan showed patchy right lower lobe infiltrate, concerning for pneumonia.  -Started  on azithro, rocephin. Follow-up on cultures which are negative so far. HIV is nonreactive.  -Repeat CXR recommended in 3-4 weeks following trial of antibiotic therapy to ensure resolution and exclude underlying malignancy. -improving, stop Abx, day 7 today  Lactic acidosis -Lactic acid was elevated at 4.2. No evidence for sepsis. Secondary to heart failure.   Hypokalemia and hypomagnesemia -Supplemented  Positive D Dimer -D dimer 1.85 on admission. CT angio chest showed no PE.  Essential hypertension -Chronic, untreated due to nonadherence. Holding beta blocker due to cocaine abuse. Was initially started on norvasc 5 mg and PRN  hydralazine. Now only on losartan.   Depression Stable, no suicidal or homicidal ideations. Not taking meds at home now.  Alcohol/polysubstance abuse Monitor on CIWA protocol. UDS positive for cocaine and THC. No signs of withdrawal.   Macrocytosis Without anemia, likely related to alcohol abuse. TSH, B12, folate normal  DVT Prophylaxis: Lovenox    Code Status: Full code  Family Communication: Discussed with the patient.  Disposition Plan: Home in few days  Consultants: Cardiology  Procedures:  Transthoracic echocardiogram Study Conclusions - Left ventricle: The cavity size was normal. Wall thickness was   normal. Systolic function was severely reduced. The estimated   ejection fraction was 15%. Diffuse hypokinesis. The study is not   technically sufficient to allow evaluation of LV diastolic   function. - Aortic valve: Trileaflet. Sclerosis without stenosis. There was   no significant regurgitation. - Mitral valve: Mildly thickened leaflets . There was moderate   regurgitation. - Left atrium: Moderately dilated. - Right ventricle: The cavity size was mildly dilated. Systolic   function is mildly reduced. - Right atrium: The atrium was mildly dilated. - Inferior vena cava: The vessel was dilated. The respirophasic   diameter  changes  were blunted (< 50%), consistent with elevated   central venous pressure.  Impressions:  - LVEF 15%, severe global hypokinesis, normal wall thickness,   aortic valve sclerosis, moderate MR, moderately dilated LA, mild   RAE, mild RV systolic dysfunction, dilated IVC.  Antibiotics: Ceftriaxone and azithromycin   Objective:  Vital Signs  Vitals:   08/27/16 1345 08/27/16 1350 08/27/16 1415 08/27/16 1430  BP: 106/89 109/90 123/75 (!) 121/91  Pulse: 95 94 (!) 103 98  Resp: 12 16    Temp:      TempSrc:      SpO2: 100% 98% 98% 99%  Weight:      Height:        Intake/Output Summary (Last 24 hours) at 08/27/16 1502 Last data filed at 08/27/16 1144  Gross per 24 hour  Intake            661.4 ml  Output             1000 ml  Net           -338.6 ml   Filed Weights   08/25/16 0542 08/26/16 0530 08/27/16 0610  Weight: 87.6 kg (193 lb 1.6 oz) 86.7 kg (191 lb 3.2 oz) 87.4 kg (192 lb 9.6 oz)    General appearance: alert, cooperative, appears stated age and no distress  Resp: Improved air entry laterally. No definite crackles. No wheezing or rhonchi.  Cardio: regular rate and rhythm, S1, S2 normal, no murmur, click, rub or gallop GI: soft, non-tender; bowel sounds normal; no masses,  no organomegaly Extremities: extremities normal, atraumatic, no cyanosis or edema Neurologic: Awake and alert. Oriented 3. No focal neurological deficits are noted.  Lab Results:  Data Reviewed: I have personally reviewed following labs and imaging studies  CBC:  Recent Labs Lab 08/22/16 0535 08/23/16 0332 08/23/16 0954 08/27/16 1134  WBC 8.4 6.9 5.9 4.0  HGB 14.5 13.7 13.9 16.7  HCT 42.7 41.2 40.9 48.9  MCV 102.4* 102.0* 102.5* 102.9*  PLT 274 251 257 622    Basic Metabolic Panel:  Recent Labs Lab 08/24/16 0510 08/24/16 0818 08/25/16 0449 08/26/16 0512 08/27/16 0347 08/27/16 1134  NA 136  --  136 136 136 135  K 3.2*  --  3.7 3.7 4.5 4.7  CL 102  --  100* 100* 102 103  CO2  24  --  26 26 26 26   GLUCOSE 126*  --  102* 119* 97 96  BUN 9  --  9 8 10 8   CREATININE 1.06  --  1.00 0.96 0.99 0.95  CALCIUM 8.5*  --  8.4* 8.7* 8.9 9.3  MG  --  1.5* 1.8  --   --   --     GFR: Estimated Creatinine Clearance: 118.8 mL/min (by C-G formula based on SCr of 0.95 mg/dL).  Liver Function Tests:  Recent Labs Lab 08/22/16 0535  AST 39  ALT 32  ALKPHOS 66  BILITOT 0.6  PROT 6.2*  ALBUMIN 3.2*   Coagulation Profile:  Recent Labs Lab 08/20/16 2308 08/27/16 1134  INR 1.34 1.11    Cardiac Enzymes:  Recent Labs Lab 08/20/16 1931 08/21/16 0150 08/21/16 0731  TROPONINI 0.07* 0.06* 0.06*   Thyroid Function Tests: No results for input(s): TSH, T4TOTAL, FREET4, T3FREE, THYROIDAB in the last 72 hours.   Recent Results (from the past 240 hour(s))  Culture, blood (routine x 2) Call MD if unable to obtain prior to antibiotics being given     Status:  None   Collection Time: 08/20/16  8:00 PM  Result Value Ref Range Status   Specimen Description BLOOD LEFT FOREARM  Final   Special Requests IN PEDIATRIC BOTTLE Blood Culture adequate volume  Final   Culture NO GROWTH 5 DAYS  Final   Report Status 08/25/2016 FINAL  Final  Culture, blood (routine x 2) Call MD if unable to obtain prior to antibiotics being given     Status: None   Collection Time: 08/20/16  8:15 PM  Result Value Ref Range Status   Specimen Description BLOOD RIGHT HAND  Final   Special Requests   Final    BOTTLES DRAWN AEROBIC ONLY Blood Culture adequate volume   Culture NO GROWTH 5 DAYS  Final   Report Status 08/25/2016 FINAL  Final  MRSA PCR Screening     Status: Abnormal   Collection Time: 08/21/16 12:37 AM  Result Value Ref Range Status   MRSA by PCR (A) NEGATIVE Final    INVALID, UNABLE TO DETERMINE THE PRESENCE OF TARGET DNA DUE TO SPECIMEN INTEGRITY. RECOLLECTION REQUESTED.    Comment: RESULT CALLED TO, READ BACK BY AND VERIFIED WITH: T POLING,RN @0745  08/21/16 MKELLY,MLT        The  GeneXpert MRSA Assay (FDA approved for NASAL specimens only), is one component of a comprehensive MRSA colonization surveillance program. It is not intended to diagnose MRSA infection nor to guide or monitor treatment for MRSA infections.   MRSA culture     Status: None   Collection Time: 08/21/16 12:37 AM  Result Value Ref Range Status   Specimen Description NASAL SWAB  Final   Special Requests NONE  Final   Culture NO MRSA DETECTED  Final   Report Status 08/23/2016 FINAL  Final      Radiology Studies: No results found.   Medications:  Scheduled: . [START ON 08/28/2016] aspirin  325 mg Oral Daily  . azithromycin  500 mg Oral QHS  . carvedilol  3.125 mg Oral BID WC  . dextromethorphan-guaiFENesin  1 tablet Oral BID  . enoxaparin (LOVENOX) injection  40 mg Subcutaneous Daily  . folic acid  1 mg Oral Daily  . losartan  50 mg Oral Daily  . multivitamin with minerals  1 tablet Oral Daily  . potassium chloride  20 mEq Oral BID  . sodium chloride flush  10-40 mL Intracatheter Q12H  . sodium chloride flush  3 mL Intravenous Q12H  . thiamine  100 mg Oral Daily   Continuous: . sodium chloride    . milrinone 0.25 mcg/kg/min (08/27/16 1015)   LTR:VUYEBX chloride, acetaminophen, hydrALAZINE, nitroGLYCERIN, ondansetron, oxyCODONE-acetaminophen, sodium chloride flush, sodium chloride flush     LOS: 6 days   [facility]  [facility] Pager [phone] 08/27/2016, 3:02 PM  If 7PM-7AM, please contact night-coverage at [email or web address], password [facility]

## 2016-08-27 NOTE — Interval H&P Note (Signed)
History and Physical Interval Note:  08/27/2016 12:16 PM  [name] [name]  has presented today for surgery, with the diagnosis of chf  The various methods of treatment have been discussed with the patient and family. After consideration of risks, benefits and other options for treatment, the patient has consented to  Procedure(s): Right/Left Heart Cath and Coronary Angiography (N/A) as a surgical intervention .  The patient's history has been reviewed, patient examined, no change in status, stable for surgery.  I have reviewed the patient's chart and labs.  Questions were answered to the patient's satisfaction.     Sherren Mocha

## 2016-08-27 NOTE — Progress Notes (Signed)
Pt prepped and sent to Cath lab. Pt belongings remain in room; will move if pt does not return to this floor. Pt aware.

## 2016-08-27 NOTE — H&P (View-Only) (Signed)
Progress Note  Patient Name: [name] [name] Date of Encounter: 08/27/2016  Primary Cardiologist: New - Dr. Harrington Challenger  Subjective   Denies any chest pain or palpitations. Breathing at baseline. No remaining questions or concerns regarding upcoming cath.   Inpatient Medications    Scheduled Meds: . aspirin  81 mg Oral Pre-Cath  . [START ON 08/28/2016] aspirin  325 mg Oral Daily  . azithromycin  500 mg Oral QHS  . carvedilol  3.125 mg Oral BID WC  . dextromethorphan-guaiFENesin  1 tablet Oral BID  . enoxaparin (LOVENOX) injection  40 mg Subcutaneous Daily  . folic acid  1 mg Oral Daily  . losartan  50 mg Oral Daily  . multivitamin with minerals  1 tablet Oral Daily  . potassium chloride  20 mEq Oral BID  . sodium chloride flush  10-40 mL Intracatheter Q12H  . sodium chloride flush  3 mL Intravenous Q12H  . thiamine  100 mg Oral Daily   Continuous Infusions: . sodium chloride    . milrinone 0.25 mcg/kg/min (08/26/16 1815)   PRN Meds: sodium chloride, hydrALAZINE, nitroGLYCERIN, ondansetron, oxyCODONE-acetaminophen, sodium chloride flush, sodium chloride flush   Vital Signs    Vitals:   08/26/16 1220 08/26/16 1800 08/26/16 1955 08/27/16 0610  BP: 120/83 120/80 (!) 125/96 116/81  Pulse: (!) 115 (!) 105 (!) 117 (!) 112  Resp: 20  18 18   Temp: 98.3 F (36.8 C)  98.1 F (36.7 C) 97.9 F (36.6 C)  TempSrc: Oral  Oral Oral  SpO2: 100%  100% 100%  Weight:    192 lb 9.6 oz (87.4 kg)  Height:        Intake/Output Summary (Last 24 hours) at 08/27/16 0827 Last data filed at 08/27/16 0700  Gross per 24 hour  Intake           1198.8 ml  Output             1600 ml  Net           -401.2 ml   Filed Weights   08/25/16 0542 08/26/16 0530 08/27/16 0610  Weight: 193 lb 1.6 oz (87.6 kg) 191 lb 3.2 oz (86.7 kg) 192 lb 9.6 oz (87.4 kg)    Telemetry    Sinus tachycardia, HR 100's to 110's.  - Personally Reviewed  ECG    No new tracings.   Physical Exam   General: Well  developed, well nourished African American male appearing in no acute distress. Head: Normocephalic, atraumatic.  Neck: Supple without bruits, JVD at 8cm. Lungs:  Resp regular and unlabored, CTA without wheezing or rales. Heart: Regular rhythm, tachycardiac rate. S1, S2, no S3, S4, or murmur; no rub. Abdomen: Soft, non-tender, non-distended with normoactive bowel sounds. No hepatomegaly. No rebound/guarding. No obvious abdominal masses. Extremities: No clubbing, cyanosis, or edema. Distal pedal pulses are 2+ bilaterally. Neuro: Alert and oriented X 3. Moves all extremities spontaneously. Psych: Normal affect.  Labs    Chemistry Recent Labs Lab 08/22/16 0535  08/25/16 0449 08/26/16 0512 08/27/16 0347  NA 137  < > 136 136 136  K 3.6  < > 3.7 3.7 4.5  CL 105  < > 100* 100* 102  CO2 21*  < > 26 26 26   GLUCOSE 105*  < > 102* 119* 97  BUN 11  < > 9 8 10   CREATININE 1.04  < > 1.00 0.96 0.99  CALCIUM 8.6*  < > 8.4* 8.7* 8.9  PROT 6.2*  --   --   --   --  ALBUMIN 3.2*  --   --   --   --   AST 39  --   --   --   --   ALT 32  --   --   --   --   ALKPHOS 66  --   --   --   --   BILITOT 0.6  --   --   --   --   GFRNONAA >60  < > >60 >60 >60  GFRAA >60  < > >60 >60 >60  ANIONGAP 11  < > 10 10 8   < > = values in this interval not displayed.   Hematology Recent Labs Lab 08/22/16 0535 08/23/16 0332 08/23/16 0954  WBC 8.4 6.9 5.9  RBC 4.17* 4.04* 3.99*  HGB 14.5 13.7 13.9  HCT 42.7 41.2 40.9  MCV 102.4* 102.0* 102.5*  MCH 34.8* 33.9 34.8*  MCHC 34.0 33.3 34.0  RDW 13.4 12.8 12.8  PLT 274 251 257    Cardiac Enzymes Recent Labs Lab 08/20/16 1931 08/21/16 0150 08/21/16 0731  TROPONINI 0.07* 0.06* 0.06*    Recent Labs Lab 08/20/16 1154  TROPIPOC 0.05     BNP Recent Labs Lab 08/20/16 2004 08/25/16 1229  BNP 1,729.9* 1,067.1*     DDimer  Recent Labs Lab 08/20/16 1428  DDIMER 1.85*     Radiology    No results found.  Cardiac Studies   Echocardiogram:  08/22/2016 Study Conclusions  - Left ventricle: The cavity size was normal. Wall thickness was   normal. Systolic function was severely reduced. The estimated   ejection fraction was 15%. Diffuse hypokinesis. The study is not   technically sufficient to allow evaluation of LV diastolic   function. - Aortic valve: Trileaflet. Sclerosis without stenosis. There was   no significant regurgitation. - Mitral valve: Mildly thickened leaflets . There was moderate   regurgitation. - Left atrium: Moderately dilated. - Right ventricle: The cavity size was mildly dilated. Systolic   function is mildly reduced. - Right atrium: The atrium was mildly dilated. - Inferior vena cava: The vessel was dilated. The respirophasic   diameter changes were blunted (< 50%), consistent with elevated   central venous pressure.  Impressions:  - LVEF 15%, severe global hypokinesis, normal wall thickness,   aortic valve sclerosis, moderate MR, moderately dilated LA, mild   RAE, mild RV systolic dysfunction, dilated IVC.  Patient Profile     46 y.o. male w/ PMH of HTN, HLD, and polysubstance use (Cocaine, THC, and alcohol) who presented to [facility] on 08/20/2016 for worsening dyspnea and chest pain, found to have a new diagnosis of acute systolic CHF with EF at 91%.   Assessment & Plan    1. Acute Systolic CHF - new diagnosis, as echo this admission shows a severely reduced LVEF at 15% with diffuse hypokinesis and moderate MR. No prior study for comparison. BNP elevated at 1729 on admission.  - On Milrinone at 0.25 mcg/kg/min. Remains on IV Lasix 40mg  BID with a recorded net output of -2.6L, however weight is down 15 lbs. O2 at 65.8 this AM.  - has been started on Coreg 3.125mg  BID and Losartan 50mg  daily. Will need to monitor the use of BB therapy closely in the setting of known cocaine use.  - plan is for right/left heart catheterization today to further assess his cardiomyopathy. Has been NPO since midnight.     2. Elevated Troponin - cyclic values have been flat at 0.06, 0.06, and 0.07.  Likely secondary to demand ischemia in the setting of acute CHF, PNA and HTN. However, given his severely reduced LVEF, he will need a LHC to exclude CAD.   3. Polysubstance Abuse - UDS positive for cocaine, and Tetrahydrocannabinol. He also admits to moderate to heavy ETOH consumption. Needs substance abuse counseling. Discontinuation of ETOH and cocaine is imperative.  4. HLD - Lipid panel shows LDL at 102 mg/dL. If LHC confirms the presence of CAD, he will need initiation of a statin with goal LDL of < 70 mg/dL.    5. Hypokalemia:  - resolved, K+ at 4.5 this AM.   6. Mitral Regurgitation - moderate by echo this admission.  - continue to follow in the outpatient setting.  7. RLL PNA - per admitting team.   Signed, Erma Heritage , PA-C 8:27 AM 08/27/2016 Pager: [phone]  Patient examined chart reviewed. CHF improved no murmur PMI increased lungs clear For right and left cath today Discussed issues of polysubstance abuse and risk to heart And ppt of sudden death. Also need to moderate quit ETOH in regard to CHF especially If found to be non ischemic   Jenkins Rouge

## 2016-08-27 NOTE — Progress Notes (Signed)
Patient returns to 3e from cath lab. Pt is c/a/ox3, he presents with stable vitals and a right groin vascular site at level 0.   Pt tolerated clear liquids fine, he has been advanced to heart healthy as per order.   Pt is under bedrest until 1750 this evening, pt understands.

## 2016-08-27 NOTE — Progress Notes (Signed)
Site area: Right groin a 5 french arterial and 7 french venous sheath was removed  Site Prior to Removal:  Level 0  Pressure Applied For 25 MINUTES    Bedrest Beginning at 1350p  Manual:   Yes.    Patient Status During Pull:  stable  Post Pull Groin Site:  Level 0  Post Pull Instructions Given:  Yes.    Post Pull Pulses Present:  No.  Dressing Applied:  Yes.    Comments:  VS remain stable during sheath pull.

## 2016-08-28 ENCOUNTER — Telehealth: Payer: Self-pay | Admitting: Internal Medicine

## 2016-08-28 LAB — BASIC METABOLIC PANEL
Anion gap: 8 (ref 5–15)
BUN: 7 mg/dL (ref 6–20)
CHLORIDE: 104 mmol/L (ref 101–111)
CO2: 24 mmol/L (ref 22–32)
Calcium: 9 mg/dL (ref 8.9–10.3)
Creatinine, Ser: 0.88 mg/dL (ref 0.61–1.24)
GFR calc Af Amer: >60 mL/min (ref >60)
GLUCOSE: 94 mg/dL (ref 65–99)
POTASSIUM: 4.8 mmol/L (ref 3.5–5.1)
Sodium: 136 mmol/L (ref 135–145)

## 2016-08-28 LAB — COOXEMETRY PANEL
CARBOXYHEMOGLOBIN: 0.6 % (ref 0.5–1.5)
CARBOXYHEMOGLOBIN: 0.6 % (ref 0.5–1.5)
METHEMOGLOBIN: 0.9 % (ref 0.0–1.5)
METHEMOGLOBIN: 0.8 % (ref 0.0–1.5)
O2 SAT: 65.6 %
O2 Saturation: 50.1 %
TOTAL HEMOGLOBIN: 16 g/dL (ref 12.0–16.0)
Total hemoglobin: 16.1 g/dL — ABNORMAL HIGH (ref 12.0–16.0)

## 2016-08-28 MED ORDER — FUROSEMIDE 40 MG PO TABS
40.0000 mg | ORAL_TABLET | Freq: Every day | ORAL | Status: DC
  Administered 2016-08-28 – 2016-08-29 (×2): 40 mg via ORAL
  Filled 2016-08-28 (×2): qty 1

## 2016-08-28 MED ORDER — CARVEDILOL 6.25 MG PO TABS
6.2500 mg | ORAL_TABLET | Freq: Two times a day (BID) | ORAL | Status: DC
  Administered 2016-08-28 – 2016-08-29 (×2): 6.25 mg via ORAL
  Filled 2016-08-28 (×2): qty 1

## 2016-08-28 NOTE — Progress Notes (Signed)
Progress Note  Patient Name: [name] [name] Date of Encounter: 08/28/2016  Primary Cardiologist: New - Dr. Harrington Challenger  Subjective   Denies any chest pain or palpitations. Breathing at baseline. No complications regarding groin cath site.   Inpatient Medications    Scheduled Meds: . aspirin  325 mg Oral Daily  . carvedilol  3.125 mg Oral BID WC  . dextromethorphan-guaiFENesin  1 tablet Oral BID  . enoxaparin (LOVENOX) injection  40 mg Subcutaneous Daily  . folic acid  1 mg Oral Daily  . losartan  50 mg Oral Daily  . multivitamin with minerals  1 tablet Oral Daily  . potassium chloride  20 mEq Oral BID  . sodium chloride flush  10-40 mL Intracatheter Q12H  . sodium chloride flush  3 mL Intravenous Q12H  . thiamine  100 mg Oral Daily   Continuous Infusions: . milrinone 0.125 mcg/kg/min (08/28/16 0031)   PRN Meds: sodium chloride, acetaminophen, hydrALAZINE, nitroGLYCERIN, ondansetron, oxyCODONE-acetaminophen, sodium chloride flush, sodium chloride flush   Vital Signs    Vitals:   08/27/16 1952 08/27/16 2114 08/27/16 2300 08/28/16 0459  BP: 107/70 107/74 100/74 116/85  Pulse: 100 (!) 107 (!) 102 (!) 117  Resp:  20  20  Temp:  98.3 F (36.8 C)  98.5 F (36.9 C)  TempSrc:  Oral  Oral  SpO2:  97%  98%  Weight:    196 lb 14.4 oz (89.3 kg)  Height:        Intake/Output Summary (Last 24 hours) at 08/28/16 0741 Last data filed at 08/28/16 0619  Gross per 24 hour  Intake             91.7 ml  Output              850 ml  Net           -758.3 ml   Filed Weights   08/26/16 0530 08/27/16 0610 08/28/16 0459  Weight: 191 lb 3.2 oz (86.7 kg) 192 lb 9.6 oz (87.4 kg) 196 lb 14.4 oz (89.3 kg)    Telemetry    Sinus tachycardia, HR low-100's to 110's.  - Personally Reviewed  ECG    No new tracings.   Physical Exam   General: Well developed, well nourished African American male appearing in no acute distress. Head: Normocephalic, atraumatic.  Neck: Supple without bruits,  JVD not elevated. Lungs:  Resp regular and unlabored, CTA without wheezing or rales. Heart: Regular rhythm, tachycardiac rate, S1, S2, no S3, S4, or murmur; no rub. Abdomen: Soft, non-tender, non-distended with normoactive bowel sounds. No hepatomegaly. No rebound/guarding. No obvious abdominal masses. Extremities: No clubbing, cyanosis, or lower extremity edema. Distal pedal pulses are 2+ bilaterally. Neuro: Alert and oriented X 3. Moves all extremities spontaneously. Psych: Normal affect.  Labs    Chemistry Recent Labs Lab 08/22/16 0535  08/27/16 0347 08/27/16 1134 08/28/16 0449  NA 137  < > 136 135 136  K 3.6  < > 4.5 4.7 4.8  CL 105  < > 102 103 104  CO2 21*  < > 26 26 24   GLUCOSE 105*  < > 97 96 94  BUN 11  < > 10 8 7   CREATININE 1.04  < > 0.99 0.95 0.88  CALCIUM 8.6*  < > 8.9 9.3 9.0  PROT 6.2*  --   --   --   --   ALBUMIN 3.2*  --   --   --   --   AST  39  --   --   --   --   ALT 32  --   --   --   --   ALKPHOS 66  --   --   --   --   BILITOT 0.6  --   --   --   --   GFRNONAA >60  < > >60 >60 >60  GFRAA >60  < > >60 >60 >60  ANIONGAP 11  < > 8 6 8   < > = values in this interval not displayed.   Hematology Recent Labs Lab 08/23/16 0332 08/23/16 0954 08/27/16 1134  WBC 6.9 5.9 4.0  RBC 4.04* 3.99* 4.75  HGB 13.7 13.9 16.7  HCT 41.2 40.9 48.9  MCV 102.0* 102.5* 102.9*  MCH 33.9 34.8* 35.2*  MCHC 33.3 34.0 34.2  RDW 12.8 12.8 12.3  PLT 251 257 302    Cardiac EnzymesNo results for input(s): TROPONINI in the last 168 hours. No results for input(s): TROPIPOC in the last 168 hours.   BNP Recent Labs Lab 08/25/16 1229  BNP 1,067.1*     DDimer No results for input(s): DDIMER in the last 168 hours.   Radiology    No results found.  Cardiac Studies   Echocardiogram: 08/22/2016 Study Conclusions  - Left ventricle: The cavity size was normal. Wall thickness was   normal. Systolic function was severely reduced. The estimated   ejection fraction was  15%. Diffuse hypokinesis. The study is not   technically sufficient to allow evaluation of LV diastolic   function. - Aortic valve: Trileaflet. Sclerosis without stenosis. There was   no significant regurgitation. - Mitral valve: Mildly thickened leaflets . There was moderate   regurgitation. - Left atrium: Moderately dilated. - Right ventricle: The cavity size was mildly dilated. Systolic   function is mildly reduced. - Right atrium: The atrium was mildly dilated. - Inferior vena cava: The vessel was dilated. The respirophasic   diameter changes were blunted (< 50%), consistent with elevated   central venous pressure.  Impressions:  - LVEF 15%, severe global hypokinesis, normal wall thickness,   aortic valve sclerosis, moderate MR, moderately dilated LA, mild   RAE, mild RV systolic dysfunction, dilated IVC.  Cardiac Catheterization: 08/27/2016 1. Angiographically normal coronary arteries (left dominant) 2. Normal right heart hemodynamics with preserved cardiac output by Fick (CI 2.7)  Continued management of CHF. Pt is well-diuresed. May be ready to begin weaning  from milrinone.   Patient Profile     46 y.o. male w/ PMH of HTN, HLD, and polysubstance use (Cocaine, THC, and alcohol) who presented to [facility] on 08/20/2016 for worsening dyspnea and chest pain, found to have a new diagnosis of acute systolic CHF with EF at 00%.  Assessment & Plan    1. Acute Systolic CHF/ Nonischemic Cardiomyopathy  - new diagnosis, as echo this admission shows a severely reduced LVEF at 15% with diffuse hypokinesis and moderate MR. No prior study for comparison. BNP elevated at 1729 on admission.  - has been started on Coreg 3.125mg  BID and Losartan 50mg  daily. Will titrate Coreg to 6.25mg  BID in the setting of continued tachycardia. Will need to monitor the use of BB therapy closely in the setting of known cocaine use. Consider the addition of Spironolactone or Entresto as an outpatient if BP  allows.  -  right/left heart catheterization on 4/9 showed normal coronary arteries with normal right heart pressures.  - Continue to wean Milrinone (currently at  0.125 mcg/kg/min) and plan to stop later today. Previously receiving IV Lasix 40mg  BID but this was held for his cath. Will restart PO Lasix 40mg  daily as weight is starting to trend back up. - sodium and fluid restriction reviewed with the patient along with discontinuation of ETOH and cocaine use.    2. Elevated Troponin - cyclic values have been flat at 0.06, 0.06, and 0.07. Likely secondary to demand ischemia in the setting of acute CHF, PNA and HTN.  - cath on 08/27/2016 showed normal cors.   3. Polysubstance Abuse - UDS positive for cocaine, and Tetrahydrocannabinol. He also admits to moderate to heavy ETOH consumption. Needs substance abuse counseling. Discontinuation of ETOH and cocaine is imperative.  4. HLD - Lipid panel shows LDL at 102 mg/dL. No evidence of CAD by cath.   5. Hypokalemia: - resolved, K+ at 4.8 this AM.   6. Mitral Regurgitation - moderate by echo this admission.  - continue to follow in the outpatient setting.  7. RLL PNA - per admitting team.   Signed, Erma Heritage , PA-C 7:41 AM 08/28/2016 Pager: [phone]  Coox is only 50% on lower dose milrinone Little motivation to stop drugs and alcohol Exam with  MR murmur clear lungs  PCWP and EDP normal at cath with no CAD CO not listed on cath report But Dr Burt Knack indicated not low output.  Will repeat coox at 3:00 and if still low would not stop milrinone and ask CHF team to see  Jenkins Rouge

## 2016-08-28 NOTE — Progress Notes (Signed)
[facility] PROGRESS NOTE  [name] [name] NIO:[id number] DOB: [birth year] DOA: 08/20/2016  PCP: No PCP Per Patient  Brief History/Interval Summary: 46 year old African-American male with a past medical history of polysubstance abuse, untreated hypertension, presented with one-week history of worsening dyspnea and cough.  Found to have acute systolic CHF, EF 81%, . Cardiology was consulted. Patient was started on milrinone infusion.  s/p cardiac catheterization on 4/9- normal coronaries Milrinone being weaned  Subjective/Interval History: No chest pain, breathing back to baseline  Assessment/Plan:  Acute respiratory failure with hypoxia  -secondary to volume overload with pleural effusions, pneumonia also possible  -improved see below  Acute systolic and diastolic CHF / Small to moderate bilateral pleural effusions  Cardiology was consulted. Echocardiogram showed severely decreased LV function with EF of 15%. Patient does have a history of cocaine abuse.  -s/p LHC, normal coronaries -Patient was started on milrinone by cardiology. PICC line had to be placed. -negative only 3.4L, wt down from 207 to 196 -Continues to be on  furosemide/milrinone per Cardiology now being weaned -per Cards  Cocaine induced chest pain / Troponin elevation Troponin mildly elevated, 0.06 x 2. Likely demand ischemia from cocaine abuse. The 12 lead EKG showed ST, LVH and lateral TWI which was not present in 2011. ECHO showed EF 15% and severe global hypokinesis.   -cath with normal coronaries  Right lower lobe pneumonia CXR and CT scan showed patchy right lower lobe infiltrate, concerning for pneumonia.  -Started on azithro, rocephin. Follow-up on cultures which are negative so far. HIV is nonreactive.  -Repeat CXR recommended in 3-4 weeks following trial of antibiotic therapy to ensure resolution and exclude underlying malignancy. -improving, stopped Abx  Lactic acidosis -Lactic acid was  elevated at 4.2. No evidence for sepsis. Secondary to heart failure.  -resolved  Hypokalemia and hypomagnesemia -Supplemented  Positive D Dimer -D dimer 1.85 on admission. CT angio chest showed no PE.  Essential hypertension -Chronic, untreated due to nonadherence. Holding beta blocker due to cocaine abuse. Was initially started on norvasc 5 mg and PRN  hydralazine. Now only on losartan.   Depression Stable, no suicidal or homicidal ideations. Not taking meds at home now.  Alcohol/polysubstance abuse -UDS positive for cocaine and THC. No signs of withdrawal.   Macrocytosis Without anemia, likely related to alcohol abuse. TSH, B12, folate normal  DVT Prophylaxis: Lovenox    Code Status: Full code  Family Communication: Discussed with the patient.  Disposition Plan: Home when cleared by Cards  Consultants: Cardiology  Procedures:  Transthoracic echocardiogram - LVEF 15%, severe global hypokinesis, normal wall thickness,   aortic valve sclerosis, moderate MR, moderately dilated LA, mild   RAE, mild RV systolic dysfunction, dilated IVC.  Cath:   Antibiotics: Ceftriaxone and azithromycin 4/2 till 4/9   Objective:  Vital Signs  Vitals:   08/27/16 2300 08/28/16 0459 08/28/16 0800 08/28/16 1346  BP: 100/74 116/85 106/83 119/88  Pulse: (!) 102 (!) 117 (!) 110 (!) 109  Resp:  20 20 20   Temp:  98.5 F (36.9 C) 98.2 F (36.8 C) 98.6 F (37 C)  TempSrc:  Oral Oral Oral  SpO2:  98% 100% 98%  Weight:  89.3 kg (196 lb 14.4 oz)    Height:        Intake/Output Summary (Last 24 hours) at 08/28/16 1457 Last data filed at 08/28/16 1300  Gross per 24 hour  Intake            849.1 ml  Output  1300 ml  Net           -450.9 ml   Filed Weights   08/26/16 0530 08/27/16 0610 08/28/16 0459  Weight: 86.7 kg (191 lb 3.2 oz) 87.4 kg (192 lb 9.6 oz) 89.3 kg (196 lb 14.4 oz)    General appearance: alert, cooperative, appears stated age and no distress  Resp:  Improved air entry laterally. No definite crackles. No wheezing or rhonchi.  Cardio: regular rate and rhythm, S1, S2 normal, no murmur, click, rub or gallop GI: soft, non-tender; bowel sounds normal; no masses,  no organomegaly Extremities: extremities normal, atraumatic, no cyanosis or edema Neurologic: Awake and alert. Oriented 3. No focal neurological deficits are noted.  Lab Results:  Data Reviewed: I have personally reviewed following labs and imaging studies  CBC:  Recent Labs Lab 08/22/16 0535 08/23/16 0332 08/23/16 0954 08/27/16 1134  WBC 8.4 6.9 5.9 4.0  HGB 14.5 13.7 13.9 16.7  HCT 42.7 41.2 40.9 48.9  MCV 102.4* 102.0* 102.5* 102.9*  PLT 274 251 257 098    Basic Metabolic Panel:  Recent Labs Lab 08/24/16 0818 08/25/16 0449 08/26/16 0512 08/27/16 0347 08/27/16 1134 08/28/16 0449  NA  --  136 136 136 135 136  K  --  3.7 3.7 4.5 4.7 4.8  CL  --  100* 100* 102 103 104  CO2  --  26 26 26 26 24   GLUCOSE  --  102* 119* 97 96 94  BUN  --  9 8 10 8 7   CREATININE  --  1.00 0.96 0.99 0.95 0.88  CALCIUM  --  8.4* 8.7* 8.9 9.3 9.0  MG 1.5* 1.8  --   --   --   --     GFR: Estimated Creatinine Clearance: 130.8 mL/min (by C-G formula based on SCr of 0.88 mg/dL).  Liver Function Tests:  Recent Labs Lab 08/22/16 0535  AST 39  ALT 32  ALKPHOS 66  BILITOT 0.6  PROT 6.2*  ALBUMIN 3.2*   Coagulation Profile:  Recent Labs Lab 08/27/16 1134  INR 1.11    Cardiac Enzymes: No results for input(s): CKTOTAL, CKMB, CKMBINDEX, TROPONINI in the last 168 hours. Thyroid Function Tests: No results for input(s): TSH, T4TOTAL, FREET4, T3FREE, THYROIDAB in the last 72 hours.   Recent Results (from the past 240 hour(s))  Culture, blood (routine x 2) Call MD if unable to obtain prior to antibiotics being given     Status: None   Collection Time: 08/20/16  8:00 PM  Result Value Ref Range Status   Specimen Description BLOOD LEFT FOREARM  Final   Special Requests IN  PEDIATRIC BOTTLE Blood Culture adequate volume  Final   Culture NO GROWTH 5 DAYS  Final   Report Status 08/25/2016 FINAL  Final  Culture, blood (routine x 2) Call MD if unable to obtain prior to antibiotics being given     Status: None   Collection Time: 08/20/16  8:15 PM  Result Value Ref Range Status   Specimen Description BLOOD RIGHT HAND  Final   Special Requests   Final    BOTTLES DRAWN AEROBIC ONLY Blood Culture adequate volume   Culture NO GROWTH 5 DAYS  Final   Report Status 08/25/2016 FINAL  Final  MRSA PCR Screening     Status: Abnormal   Collection Time: 08/21/16 12:37 AM  Result Value Ref Range Status   MRSA by PCR (A) NEGATIVE Final    INVALID, UNABLE TO DETERMINE THE PRESENCE OF  TARGET DNA DUE TO SPECIMEN INTEGRITY. RECOLLECTION REQUESTED.    Comment: RESULT CALLED TO, READ BACK BY AND VERIFIED WITH: T POLING,RN @0745  08/21/16 MKELLY,MLT        The GeneXpert MRSA Assay (FDA approved for NASAL specimens only), is one component of a comprehensive MRSA colonization surveillance program. It is not intended to diagnose MRSA infection nor to guide or monitor treatment for MRSA infections.   MRSA culture     Status: None   Collection Time: 08/21/16 12:37 AM  Result Value Ref Range Status   Specimen Description NASAL SWAB  Final   Special Requests NONE  Final   Culture NO MRSA DETECTED  Final   Report Status 08/23/2016 FINAL  Final      Radiology Studies: No results found.   Medications:  Scheduled: . aspirin  325 mg Oral Daily  . carvedilol  6.25 mg Oral BID WC  . dextromethorphan-guaiFENesin  1 tablet Oral BID  . enoxaparin (LOVENOX) injection  40 mg Subcutaneous Daily  . folic acid  1 mg Oral Daily  . furosemide  40 mg Oral Daily  . losartan  50 mg Oral Daily  . multivitamin with minerals  1 tablet Oral Daily  . sodium chloride flush  10-40 mL Intracatheter Q12H  . sodium chloride flush  3 mL Intravenous Q12H  . thiamine  100 mg Oral Daily    Continuous: . milrinone 0.125 mcg/kg/min (08/28/16 0031)   CBS:WHQPRF chloride, acetaminophen, hydrALAZINE, nitroGLYCERIN, ondansetron, oxyCODONE-acetaminophen, sodium chloride flush, sodium chloride flush     LOS: 7 days   [facility]  [facility] Pager [phone] 08/28/2016, 2:57 PM  If 7PM-7AM, please contact night-coverage at [email or web address], password [facility]

## 2016-08-28 NOTE — Progress Notes (Signed)
Overnight Pt did not have any complain, MIlrinone is decreased to 3.82ml/hr, cath site is clean, dry and intact, level 0, pt slept well at night, no any other specific complaint noted, will continue to monitor the patient

## 2016-08-28 NOTE — Progress Notes (Signed)
Educated pt. On the importance of taking his BP medications. Mr. Sena has not taking his BP medication as proscribed. HE has stated that he understands the importance and will comply.

## 2016-08-28 NOTE — Telephone Encounter (Signed)
New message      TOC appt on 09-04-16 per [place] with Vin.

## 2016-08-29 LAB — COOXEMETRY PANEL
Carboxyhemoglobin: 0.8 % (ref 0.5–1.5)
METHEMOGLOBIN: 0.7 % (ref 0.0–1.5)
O2 SAT: 41.7 %
TOTAL HEMOGLOBIN: 16.1 g/dL — AB (ref 12.0–16.0)

## 2016-08-29 MED ORDER — THIAMINE HCL 100 MG PO TABS: 100.0000 mg | ORAL_TABLET | Freq: Every day | ORAL | 0 refills | Status: DC

## 2016-08-29 MED ORDER — CARVEDILOL 6.25 MG PO TABS: 6.2500 mg | ORAL_TABLET | Freq: Two times a day (BID) | ORAL | 0 refills | Status: DC

## 2016-08-29 MED ORDER — FUROSEMIDE 40 MG PO TABS: 40.0000 mg | ORAL_TABLET | Freq: Every day | ORAL | 0 refills | Status: DC

## 2016-08-29 MED ORDER — ASPIRIN 325 MG PO TABS: 325.0000 mg | ORAL_TABLET | Freq: Every day | ORAL | 0 refills | Status: DC

## 2016-08-29 MED ORDER — LOSARTAN POTASSIUM 50 MG PO TABS: 50.0000 mg | ORAL_TABLET | Freq: Every day | ORAL | 0 refills | Status: DC

## 2016-08-29 NOTE — Progress Notes (Signed)
Progress Note  Patient Name: [name] [name] Date of Encounter: 08/29/2016  Primary Cardiologist: New - Dr. Harrington Challenger  Subjective   I turned milrinone off last night. Although coox fluctuating he feels fine and is euvolemic  Inpatient Medications    Scheduled Meds: . aspirin  325 mg Oral Daily  . carvedilol  6.25 mg Oral BID WC  . dextromethorphan-guaiFENesin  1 tablet Oral BID  . enoxaparin (LOVENOX) injection  40 mg Subcutaneous Daily  . folic acid  1 mg Oral Daily  . furosemide  40 mg Oral Daily  . losartan  50 mg Oral Daily  . multivitamin with minerals  1 tablet Oral Daily  . sodium chloride flush  10-40 mL Intracatheter Q12H  . sodium chloride flush  3 mL Intravenous Q12H  . thiamine  100 mg Oral Daily   Continuous Infusions:  PRN Meds: sodium chloride, acetaminophen, hydrALAZINE, nitroGLYCERIN, ondansetron, oxyCODONE-acetaminophen, sodium chloride flush, sodium chloride flush   Vital Signs    Vitals:   08/28/16 1346 08/28/16 1737 08/28/16 2120 08/29/16 0614  BP: 119/88 (!) 124/96 115/85 118/90  Pulse: (!) 109 (!) 112 (!) 104 (!) 106  Resp: 20  20 20   Temp: 98.6 F (37 C)  97.8 F (36.6 C) 98.2 F (36.8 C)  TempSrc: Oral  Oral Oral  SpO2: 98%  100% 100%  Weight:    194 lb 8 oz (88.2 kg)  Height:        Intake/Output Summary (Last 24 hours) at 08/29/16 0844 Last data filed at 08/29/16 0600  Gross per 24 hour  Intake              950 ml  Output              750 ml  Net              200 ml   Filed Weights   08/27/16 0610 08/28/16 0459 08/29/16 0614  Weight: 192 lb 9.6 oz (87.4 kg) 196 lb 14.4 oz (89.3 kg) 194 lb 8 oz (88.2 kg)    Telemetry    Sinus tachycardia, HR low-100's to 110's.  - Personally Reviewed  ECG    No new tracings.   Physical Exam   General: Well developed, well nourished African American male appearing in no acute distress. Head: Normocephalic, atraumatic.  Neck: Supple without bruits, JVD not elevated. Lungs:  Resp regular  and unlabored, CTA without wheezing or rales. Heart: Regular rhythm, tachycardiac rate, S1, S2, no S3, S4, or murmur; no rub. Abdomen: Soft, non-tender, non-distended with normoactive bowel sounds. No hepatomegaly. No rebound/guarding. No obvious abdominal masses. Extremities: No clubbing, cyanosis, or lower extremity edema. Distal pedal pulses are 2+ bilaterally. Neuro: Alert and oriented X 3. Moves all extremities spontaneously. Psych: Normal affect.  Labs    Chemistry  Recent Labs Lab 08/27/16 0347 08/27/16 1134 08/28/16 0449  NA 136 135 136  K 4.5 4.7 4.8  CL 102 103 104  CO2 26 26 24   GLUCOSE 97 96 94  BUN 10 8 7   CREATININE 0.99 0.95 0.88  CALCIUM 8.9 9.3 9.0  GFRNONAA >60 >60 >60  GFRAA >60 >60 >60  ANIONGAP 8 6 8      Hematology  Recent Labs Lab 08/23/16 0332 08/23/16 0954 08/27/16 1134  WBC 6.9 5.9 4.0  RBC 4.04* 3.99* 4.75  HGB 13.7 13.9 16.7  HCT 41.2 40.9 48.9  MCV 102.0* 102.5* 102.9*  MCH 33.9 34.8* 35.2*  MCHC 33.3 34.0 34.2  RDW 12.8 12.8 12.3  PLT 251 257 302    Cardiac EnzymesNo results for input(s): TROPONINI in the last 168 hours. No results for input(s): TROPIPOC in the last 168 hours.   BNP  Recent Labs Lab 08/25/16 1229  BNP 1,067.1*     DDimer No results for input(s): DDIMER in the last 168 hours.   Radiology    No results found.  Cardiac Studies   Echocardiogram: 08/22/2016 Study Conclusions  - Left ventricle: The cavity size was normal. Wall thickness was   normal. Systolic function was severely reduced. The estimated   ejection fraction was 15%. Diffuse hypokinesis. The study is not   technically sufficient to allow evaluation of LV diastolic   function. - Aortic valve: Trileaflet. Sclerosis without stenosis. There was   no significant regurgitation. - Mitral valve: Mildly thickened leaflets . There was moderate   regurgitation. - Left atrium: Moderately dilated. - Right ventricle: The cavity size was mildly  dilated. Systolic   function is mildly reduced. - Right atrium: The atrium was mildly dilated. - Inferior vena cava: The vessel was dilated. The respirophasic   diameter changes were blunted (< 50%), consistent with elevated   central venous pressure.  Impressions:  - LVEF 15%, severe global hypokinesis, normal wall thickness,   aortic valve sclerosis, moderate MR, moderately dilated LA, mild   RAE, mild RV systolic dysfunction, dilated IVC.  Cardiac Catheterization: 08/27/2016 1. Angiographically normal coronary arteries (left dominant) 2. Normal right heart hemodynamics with preserved cardiac output by Fick (CI 2.7)  Continued management of CHF. Pt is well-diuresed. May be ready to begin weaning  from milrinone.   Patient Profile     46 y.o. male w/ PMH of HTN, HLD, and polysubstance use (Cocaine, THC, and alcohol) who presented to [facility] on 08/20/2016 for worsening dyspnea and chest pain, found to have a new diagnosis of acute systolic CHF with EF at 81%.  Assessment & Plan    1. Acute Systolic CHF/ Nonischemic Cardiomyopathy  EF 15% weaned off milrinone needs close f/u with CHF team will arrange for next week Primary issue also substance abuse and medical compliance   2. Elevated Troponin - cyclic values have been flat at 0.06, 0.06, and 0.07. Likely secondary to demand ischemia in the setting of acute CHF, PNA and HTN.  - cath on 08/27/2016 showed normal cors.   3. Polysubstance Abuse - UDS positive for cocaine, and Tetrahydrocannabinol. He also admits to moderate to heavy ETOH consumption. Needs substance abuse counseling. Discontinuation of ETOH and cocaine is imperative.  4. HLD - Lipid panel shows LDL at 102 mg/dL. No evidence of CAD by cath.   5. Hypokalemia: - resolved, K+ at 4.8 this AM.   6. Mitral Regurgitation - moderate by echo this admission.  - continue to follow in the outpatient setting.  Jenkins Rouge

## 2016-08-29 NOTE — Progress Notes (Signed)
I spoke with CHF Clinic requesting new patient appointment in 1 week - Jasmine said they had a slot for 1 week with an APP who is able to see new patients. Follow-up has been arranged with Oda Kilts PA-C in the [facility] in the [place] at [facility] on 4/18 at 9:30am. I entered this info on his AVS with instructions to arrive at 9:15am. Melina Copa PA-C

## 2016-08-29 NOTE — Progress Notes (Signed)
Patient given discharge instructions and all questions answered.  Patient discharged with all belongings.

## 2016-08-29 NOTE — Telephone Encounter (Signed)
Attempted to call patient but there was no answer and mailbox was full.   Patient's TCM appointment was changed. Patient is to be seen in CHF clinic by PA/NP on 09/05/16 at 9:30 AM.

## 2016-08-30 NOTE — Telephone Encounter (Signed)
Patient contacted regarding discharge from [facility] on 08/29/16  Patient understands to follow up with provider CHF clinic on 09/05/16 at 9:30 AM at [facility]. Patient understands discharge instructions? Yes Patient understands medications and regiment? Yes   Patient understands to bring all medications to this visit? {yes

## 2016-09-04 ENCOUNTER — Ambulatory Visit: Payer: Self-pay | Admitting: Physician Assistant

## 2016-09-05 ENCOUNTER — Ambulatory Visit (HOSPITAL_COMMUNITY)
Admission: RE | Admit: 2016-09-05 | Discharge: 2016-09-05 | Disposition: A | Discharge status: Home / Self Care | Payer: Self-pay | Source: Ambulatory Visit | Attending: Cardiology | Admitting: Cardiology

## 2016-09-05 ENCOUNTER — Encounter (HOSPITAL_COMMUNITY): Payer: Self-pay | Admitting: Pharmacist

## 2016-09-05 ENCOUNTER — Ambulatory Visit (HOSPITAL_COMMUNITY)
Admission: RE | Admit: 2016-09-05 | Discharge: 2016-09-05 | Disposition: A | Discharge status: Home / Self Care | Payer: Self-pay | Source: Ambulatory Visit | Attending: Family Medicine | Admitting: Family Medicine

## 2016-09-05 VITALS — BP 126/80 | HR 101 | Wt 200.2 lb

## 2016-09-05 DIAGNOSIS — I110 Hypertensive heart disease with heart failure: Secondary | ICD-10-CM | POA: Insufficient documentation

## 2016-09-05 DIAGNOSIS — Z789 Other specified health status: Secondary | ICD-10-CM

## 2016-09-05 DIAGNOSIS — E785 Hyperlipidemia, unspecified: Secondary | ICD-10-CM | POA: Insufficient documentation

## 2016-09-05 DIAGNOSIS — Z7289 Other problems related to lifestyle: Secondary | ICD-10-CM

## 2016-09-05 DIAGNOSIS — I5043 Acute on chronic combined systolic (congestive) and diastolic (congestive) heart failure: Secondary | ICD-10-CM

## 2016-09-05 DIAGNOSIS — I5022 Chronic systolic (congestive) heart failure: Secondary | ICD-10-CM | POA: Insufficient documentation

## 2016-09-05 DIAGNOSIS — F1010 Alcohol abuse, uncomplicated: Secondary | ICD-10-CM | POA: Insufficient documentation

## 2016-09-05 DIAGNOSIS — I340 Nonrheumatic mitral (valve) insufficiency: Secondary | ICD-10-CM

## 2016-09-05 DIAGNOSIS — I10 Essential (primary) hypertension: Secondary | ICD-10-CM

## 2016-09-05 DIAGNOSIS — F1210 Cannabis abuse, uncomplicated: Secondary | ICD-10-CM | POA: Insufficient documentation

## 2016-09-05 DIAGNOSIS — Z79899 Other long term (current) drug therapy: Secondary | ICD-10-CM | POA: Insufficient documentation

## 2016-09-05 DIAGNOSIS — F1410 Cocaine abuse, uncomplicated: Secondary | ICD-10-CM | POA: Insufficient documentation

## 2016-09-05 DIAGNOSIS — Z7982 Long term (current) use of aspirin: Secondary | ICD-10-CM | POA: Insufficient documentation

## 2016-09-05 LAB — BASIC METABOLIC PANEL
Anion gap: 10 (ref 5–15)
BUN: 9 mg/dL (ref 6–20)
CALCIUM: 9.4 mg/dL (ref 8.9–10.3)
CHLORIDE: 106 mmol/L (ref 101–111)
CO2: 25 mmol/L (ref 22–32)
Creatinine, Ser: 0.98 mg/dL (ref 0.61–1.24)
GFR calc Af Amer: >60 mL/min (ref >60)
GFR calc non Af Amer: >60 mL/min (ref >60)
GLUCOSE: 104 mg/dL — AB (ref 65–99)
Potassium: 4.5 mmol/L (ref 3.5–5.1)
Sodium: 141 mmol/L (ref 135–145)

## 2016-09-05 LAB — MAGNESIUM: Magnesium: 1.7 mg/dL (ref 1.7–2.4)

## 2016-09-05 LAB — IRON AND TIBC
Iron: 105 ug/dL (ref 45–182)
Saturation Ratios: 24 % (ref 17.9–39.5)
TIBC: 437 ug/dL (ref 250–450)
UIBC: 332 ug/dL

## 2016-09-05 LAB — FERRITIN: FERRITIN: 69 ng/mL (ref 24–336)

## 2016-09-05 MED ORDER — SACUBITRIL-VALSARTAN 24-26 MG PO TABS: 1.0000 | ORAL_TABLET | Freq: Two times a day (BID) | ORAL | 11 refills | Status: DC

## 2016-09-05 MED ORDER — SACUBITRIL-VALSARTAN 24-26 MG PO TABS: 1.0000 | ORAL_TABLET | Freq: Two times a day (BID) | ORAL | 0 refills | Status: DC

## 2016-09-05 MED ORDER — SACUBITRIL-VALSARTAN 24-26 MG PO TABS: 1.0000 | ORAL_TABLET | Freq: Two times a day (BID) | ORAL | 6 refills | Status: DC

## 2016-09-05 MED FILL — FUROSEMIDE 40 MG TABLET: 40 | 34 days supply | Qty: 34 | Fill #0

## 2016-09-05 MED FILL — CARVEDILOL 6.25 MG TABLET: 6.25 | 34 days supply | Qty: 68 | Fill #0

## 2016-09-05 MED FILL — ENTRESTO 24 MG-26 MG TABLET: 24-26 | 30 days supply | Qty: 60 | Fill #0

## 2016-09-05 NOTE — Progress Notes (Signed)
Advanced Heart Failure Medication Review by a Pharmacist  Does the patient  feel that his/her medications are working for him/her?  yes  Has the patient been experiencing any side effects to the medications prescribed?  no  Does the patient measure his/her own blood pressure or blood glucose at home?  no   Does the patient have any problems obtaining medications due to transportation or finances?   Yes - no Rx insurance and will not be able to afford $47/mo for his meds through [facility] - will send for HF fund and refer to [facility] for assistance  Understanding of regimen: good Understanding of indications: good Potential of compliance: good Patient understands to avoid NSAIDs. Patient understands to avoid decongestants.  Issues to address at subsequent visits: Rx insurance status   Pharmacist comments:  [name] [name] is a pleasant 46 yo M presenting with his medication bottles and a daily weight and food log. 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 He did not have any specific medication-related questions or concerns for me at this time.   Ruta Hinds. Velva Harman, PharmD, BCPS, CPP Clinical Pharmacist Pager: [phone] Phone: [phone] 09/05/2016 10:40 AM      Time with patient: 10 minutes Preparation and documentation time: 2 minutes Total time: 12 minutes

## 2016-09-05 NOTE — Patient Instructions (Signed)
STOP Aspirin STOP Losartan START Entresto 24/25 mg, one tab twice a day  Labs today We will only contact you if something comes back abnormal or we need to make some changes. Otherwise no news is good news!  Your physician recommends that you schedule a follow-up appointment in: 2 weeks with Oda Kilts, PA-C   Do the following things EVERYDAY: 1) Weigh yourself in the morning before breakfast. Write it down and keep it in a log. 2) Take your medicines as prescribed 3) Eat low salt foods-Limit salt (sodium) to 2000 mg per day.  4) Stay as active as you can everyday 5) Limit all fluids for the day to less than 2 liters '

## 2016-09-05 NOTE — Discharge Summary (Signed)
[facility] Discharge Summary   Patient: [name] [name] ZMO:[id number]   PCP: No PCP Per Patient DOB: [birth year]   Date of admission: 08/20/2016   Date of discharge: 08/29/2016    Discharge Diagnoses:  Active Problems:   Essential hypertension   Major depressive disorder, recurrent severe without psychotic features ([place])   Chest pain   Alcohol use   Elevated troponin   Cocaine abuse   Right lower lobe pneumonia (HCC)   Acute on chronic combined systolic and diastolic CHF (congestive heart failure) (HCC)   Positive D dimer   Hypokalemia   Macrocytosis   Admitted From: home Disposition:  home  Recommendations for Outpatient Follow-up:  1. Please follow up with cardiology as recommended  Follow-up Information    [facility] Follow up.   Specialty:  Cardiology Why:  Follow-up has been arranged with Oda Kilts PA-C in the [facility] in the [place] [facility] on 4/18 at 9:30am. Arrive at 9:15am. Parking code for the garage is 6000. Contact information: [address] [id number] [place] [phone]         Diet recommendation: cardiac diet  Activity: The patient is advised to gradually reintroduce usual activities.  Discharge Condition: good  Code Status: full code  History of present illness: As per the H and P dictated on admission, "[name] [name] is a 46 y.o. male with medical history significant of hypertension, depression, cocaine abuse, marijuana abuse, medication noncompliance, who presents with shortness of breath, cough and chest pain.   Patient states that he has been having shortness of breath, cough and chest pain for about 1 week. He coughs up yellow colored sputum. He has chills, but denies fever, denies runny nose or sore throat. He speaks in full sentence. His chest pain is located in the substernal area, constant, 6 out of  10 in severity, tightness-like, nonradiating. Denies tenderness over calf areas. Patient had nausea and vomited twice yesterday, but no nausea, vomiting, diarrhea, abdominal pain today. Denies symptoms of UTI or unilateral weakness. Patient states that he drinks couple of beers every day. He states that he did not take his blood pressure medications for more than a year. "  Hospital Course:  Summary of his active problems in the hospital is as following. Acute respiratory failure with hypoxia  -secondary to volume overload with pleural effusions, pneumonia also possible  -improved see below  Acute systolic and diastolic CHF / Small to moderate bilateral pleural effusions  Cardiology was consulted. Echocardiogram showed severely decreased LV function with EF of 15%. Patient does have a history of cocaine abuse.  -s/p LHC, normal coronaries -Patient was started on milrinone by cardiology. PICC line had to be placed. Arranging outpatient follow up. Continue medication as below.  Cocaine induced chest pain / Troponin elevation Troponin mildly elevated, 0.06 x 2. Likely demand ischemia from cocaine abuse. The 12 lead EKG showedST, LVH and lateral TWI which was not present in 2011. ECHO showed EF 15% and severe global hypokinesis.   -cath with normal coronaries  Right lower lobe pneumonia CXR and CT scan showed patchy right lower lobe infiltrate, concerning for pneumonia.  -Started on azithro, rocephin. Follow-up on cultures which are negative so far. HIV is nonreactive.  -Repeat CXR recommended in 3-4 weeks following trial of antibiotic therapy to ensure resolution and exclude underlying malignancy. -improving, stopped Abx  Lactic acidosis -Lactic acid was elevated at  4.2. No evidence for sepsis. Secondary to heart failure.  -resolved  Hypokalemia and hypomagnesemia -Supplemented  Positive D Dimer -D dimer 1.85 on admission. CT angio chest showed no PE.  Essential  hypertension -Chronic, untreated due to nonadherence.  Now only on losartan and coreg.   Depression Stable, no suicidal or homicidal ideations. Not taking meds at home now.  Alcohol/polysubstance abuse -UDS positive for cocaine and THC. No signs of withdrawal.   Macrocytosis Without anemia, likely related to alcohol abuse. TSH, B12, folate normal  All other chronic medical condition were stable during the hospitalization.  Patient was ambulatory without any assistance. On the day of the discharge the patient's vitals were stable, and no other acute medical condition were reported by patient. the patient was felt safe to be discharge at home with family.  Procedures and Results:  Echocardiogram   Cardiac cath  PICC line   Consultations:  Cardiology   DISCHARGE MEDICATION: Discharge Medication List as of 08/29/2016 12:34 PM    START taking these medications   Details  carvedilol (COREG) 6.25 MG tablet Take 1 tablet (6.25 mg total) by mouth 2 (two) times daily with a meal., Starting Wed 08/29/2016, Normal    furosemide (LASIX) 40 MG tablet Take 1 tablet (40 mg total) by mouth daily., Starting Thu 08/30/2016, Normal    thiamine 100 MG tablet Take 1 tablet (100 mg total) by mouth daily., Starting Thu 08/30/2016, Normal    aspirin 325 MG tablet Take 1 tablet (325 mg total) by mouth daily., Starting Thu 08/30/2016, Normal    losartan (COZAAR) 50 MG tablet Take 1 tablet (50 mg total) by mouth daily., Starting Thu 08/30/2016, Normal      CONTINUE these medications which have NOT CHANGED   Details  FLUoxetine (PROZAC) 20 MG capsule Take 1 capsule (20 mg total) by mouth daily., Starting Mon 5/63/8756, Normal    folic acid (FOLVITE) 1 MG tablet Take 1 tablet (1 mg total) by mouth daily., Starting Mon 08/16/2014, No Print    Multiple Vitamin (MULTIVITAMIN WITH MINERALS) TABS tablet Take 1 tablet by mouth daily., Starting Mon 08/16/2014, No Print    traZODone (DESYREL) 50 MG tablet  Take 1 tablet (50 mg total) by mouth at bedtime as needed and may repeat dose one time if needed for sleep., Starting Mon 08/16/2014, Normal      STOP taking these medications     atenolol (TENORMIN) 50 MG tablet        No Known Allergies Discharge Instructions    Diet - low sodium heart healthy    Complete by:  As directed    Discharge instructions    Complete by:  As directed    It is important that you read following instructions as well as go over your medication list with RN to help you understand your care after this hospitalization.  Discharge Instructions: Please follow-up with PCP in one week  Please request your primary care physician to go over all Hospital Tests and Procedure/Radiological results at the follow up,  Please get all Hospital records sent to your PCP by signing hospital release before you go home.   Do not take more than prescribed Pain, Sleep and Anxiety Medications. You were cared for by a hospitalist during your hospital stay. If you have any questions about your discharge medications or the care you received while you were in the hospital after you are discharged, you can call the unit and ask to speak with the hospitalist on call  if the hospitalist that took care of you is not available.  Once you are discharged, your primary care physician will handle any further medical issues. Please note that NO REFILLS for any discharge medications will be authorized once you are discharged, as it is imperative that you return to your primary care physician (or establish a relationship with a primary care physician if you do not have one) for your aftercare needs so that they can reassess your need for medications and monitor your lab values. You Must read complete instructions/literature along with all the possible adverse reactions/side effects for all the Medicines you take and that have been prescribed to you. Take any new Medicines after you have completely understood  and accept all the possible adverse reactions/side effects. Wear Seat belts while driving. If you have smoked or chewed Tobacco in the last 2 yrs please stop smoking and/or stop any Recreational drug use.   Increase activity slowly    Complete by:  As directed      Discharge Exam: Filed Weights   08/27/16 0610 08/28/16 0459 08/29/16 0614  Weight: 87.4 kg (192 lb 9.6 oz) 89.3 kg (196 lb 14.4 oz) 88.2 kg (194 lb 8 oz)   Vitals:   08/28/16 2120 08/29/16 0614  BP: 115/85 118/90  Pulse: (!) 104 (!) 106  Resp: 20 20  Temp: 97.8 F (36.6 C) 98.2 F (36.8 C)   General: Appear in no distress, no Rash; Oral Mucosa moist. Cardiovascular: S1 and S2 Present, no Murmur, no JVD Respiratory: Bilateral Air entry present and Clear to Auscultation, no Crackles, no wheezes Abdomen: Bowel Sound present, Soft and no tenderness Extremities: no Pedal edema, on calf tenderness Neurology: Grossly no focal neuro deficit.  The results of significant diagnostics from this hospitalization (including imaging, microbiology, ancillary and laboratory) are listed below for reference.    Significant Diagnostic Studies: Dg Chest 2 View  Result Date: 08/20/2016 CLINICAL DATA:  Increase shortness of breath and chest tightness over the past week EXAM: CHEST  2 VIEW COMPARISON:  None in PACs FINDINGS: The lungs are well-expanded. The interstitial markings are coarse. Patchy alveolar opacity is present in the right lower lobe. There is no pleural effusion. The heart and pulmonary vascularity are normal. The trachea is midline. The mediastinum is normal in width. The bony thorax is unremarkable. IMPRESSION: Patchy right lower lobe pneumonia. Bronchitic changes bilaterally. Followup PA and lateral chest X-ray is recommended in 3-4 weeks following trial of antibiotic therapy to ensure resolution and exclude underlying malignancy. Electronically Signed   By: David  Martinique M.D.   On: 08/20/2016 11:50   Ct Angio Chest Pe W Or Wo  Contrast  Result Date: 08/20/2016 CLINICAL DATA:  Initial evaluation for acute shortness of breath, tachycardia, chest pain. EXAM: CT ANGIOGRAPHY CHEST WITH CONTRAST TECHNIQUE: Multidetector CT imaging of the chest was performed using the standard protocol during bolus administration of intravenous contrast. Multiplanar CT image reconstructions and MIPs were obtained to evaluate the vascular anatomy. COMPARISON:  Prior radiograph from earlier the same day. FINDINGS: Cardiovascular: Intrathoracic aorta of normal caliber without acute abnormality. Partially visualized great vessels grossly unremarkable. Cardiomegaly.  No pericardial effusion. Pulmonary arterial tree adequately opacified for evaluation. Main pulmonary artery within normal limits for size measuring 2.9 cm in diameter. The the the no filling defect to suggest acute pulmonary embolism identified. Re-formatted imaging confirms these findings. Mediastinum/Nodes: Partially visualized thyroid is unremarkable. Scattered mediastinal lymph nodes measuring up to 15 mm present (series 5, image 37), indeterminate,  but may be reactive. No appreciable hilar adenopathy. No enlarged axillary nodes. Esophagus within normal limits. Lungs/Pleura: The layering bilateral pleural effusions, right greater than left. Scattered interlobular septal thickening suggests mild pulmonary interstitial edema. Scattered ground-glass and patchy opacities within the right lower lobe, somewhat concerning for pneumonia. No other focal infiltrates. No pneumothorax. No worrisome pulmonary nodule or mass. Upper Abdomen: Peripherally calcified cyst noted arising from the upper pole left kidney, not well evaluated on this examination. Remainder the visualized upper abdomen otherwise unremarkable. Musculoskeletal: No acute osseous abnormality. No worrisome lytic or blastic osseous lesions. Mild scoliosis noted. Review of the MIP images confirms the above findings. IMPRESSION: 1. No CT evidence  for acute pulmonary embolism. 2. Patchy right lower lobe infiltrate, concerning for pneumonia. 3. Small moderate layering bilateral pleural effusions, right greater than left. 4. Cardiomegaly with mild underlying pulmonary interstitial edema. Electronically Signed   By: Jeannine Boga M.D.   On: 08/20/2016 16:56    Microbiology: No results found for this or any previous visit (from the past 240 hour(s)).   Labs: CBC: No results for input(s): WBC, NEUTROABS, HGB, HCT, MCV, PLT in the last 168 hours. Basic Metabolic Panel:  Recent Labs Lab 09/05/16 1110  NA 141  K 4.5  CL 106  CO2 25  GLUCOSE 104*  BUN 9  CREATININE 0.98  CALCIUM 9.4  MG 1.7   Liver Function Tests: No results for input(s): AST, ALT, ALKPHOS, BILITOT, PROT, ALBUMIN in the last 168 hours. No results for input(s): LIPASE, AMYLASE in the last 168 hours. No results for input(s): AMMONIA in the last 168 hours. Cardiac Enzymes: No results for input(s): CKTOTAL, CKMB, CKMBINDEX, TROPONINI in the last 168 hours. BNP (last 3 results)  Recent Labs  08/20/16 2004 08/25/16 1229  BNP 1,729.9* 1,067.1*   CBG: No results for input(s): GLUCAP in the last 168 hours. Time spent: 30 minutes  Signed:  Berle Mull  [facility] 08/29/2016 , 10:19 PM

## 2016-09-05 NOTE — Progress Notes (Signed)
Advanced Heart Failure Clinic Note   Referring Physician: Primary Care:  Primary Cardiologist: Dr Harrington Challenger HF: New (Dr. Aundra Dubin)  HPI:  [name] [name] is a 46 y.o. male with HTN, HLD, Systolic CHF, and polysubstance abuse (Cocaine, THC, and ETOH)  Presented to [facility] 08/20/16 with worsening dyspnea and CP. Newly diagnosed with Systolic CHF with EF 40% on echo. Cath as below with normal coronaries and preserved cardiac output. Initial coox 50.5% so transiently placed on milrinone 0.125 mcg/kg/min. Pt tolerated wean with diuresis and adjustment of medications.   Pt presents today for post hospital follow up. Felt alright overall. Was SOB walking from parking deck to clinic, but used stairs. No SOB on flat ground. Does have mild orthopnea. No PND since recent admission. Denies lightheadedness or dizziness. No chest pain. Does have chest heaviness with SOB and occasional tachypalpitations with same. Does not smoke. Drinks 3-4 beers a week day, drinks more than a six pack on the weekend.  Hasn't used cocaine since he's been out of the hospital.  Weight at home has been stable around 200.    Echo 08/22/16 LVEF 15%, Mod MR, Mod LAE, RV mild reduced, Mild RAE  [facility] 08/27/16 [facility] Procedural Findings: Hemodynamics (mmHg) RA mean 1 RV 23/0 PA 22/11 PCWP 15 AO 92/74 Fick CI 2.7  Review of systems complete and found to be negative unless listed in HPI.    Past medical History 1. Systolic CHF - new diagnosis 08/2016 - Cath with no CAD and preserved output (on milrinone 0.125 mcg/kg/min). - NICM work up ongoing.  ? Etiology from heavy ETOH use of long standing HTN with LVH on EKG 2. HTN - Long standing and poorly controlled. LVH on EKG 3. HLD - 08/21/16 Total 159, TG 81, HDL 41, LDL 102 4. Polysubstance abuse - Tox panel + for [facility] and Cocaine on admission 08/2016. 5. Moderate MR - By echo 08/2016 6. ETOH abuse - Drinking 3-6 beers daily, more on weekends. Has been counseled on importance of stopping  completely.   Past Medical History:  Diagnosis Date  . COLONIC POLYPS, BENIGN 01/20/2010   Annotation: 12/2009: path benign; rpt 2016 Qualifier: Diagnosis of  By: Jorene Minors, Scott    . Hypertension     Current Outpatient Prescriptions  Medication Sig Dispense Refill  . aspirin 325 MG tablet Take 1 tablet (325 mg total) by mouth daily. 30 tablet 0  . carvedilol (COREG) 6.25 MG tablet Take 1 tablet (6.25 mg total) by mouth 2 (two) times daily with a meal. 60 tablet 0  . furosemide (LASIX) 40 MG tablet Take 1 tablet (40 mg total) by mouth daily. 30 tablet 0  . losartan (COZAAR) 50 MG tablet Take 1 tablet (50 mg total) by mouth daily. 30 tablet 0  . thiamine 100 MG tablet Take 1 tablet (100 mg total) by mouth daily. 30 tablet 0   No current facility-administered medications for this encounter.     No Known Allergies    Social History   Social History  . Marital status: Single    Spouse name: N/A  . Number of children: N/A  . Years of education: N/A   Occupational History  . Not on file.   Social History Main Topics  . Smoking status: Never Smoker  . Smokeless tobacco: Never Used  . Alcohol use Yes     Comment: (2) 24 oz beers every other day pt reports  . Drug use: Yes    Types: Marijuana  Comment: pt reports marijuana use occasionally  . Sexual activity: Yes    Birth control/ protection: None   Other Topics Concern  . Not on file   Social History Narrative  . No narrative on file      Family History  Problem Relation Age of Onset  . Diabetes Other   . Hypertension Other     Vitals:   09/05/16 1025  BP: 126/80  Pulse: (!) 101  SpO2: 100%  Weight: 200 lb 4 oz (90.8 kg)   Wt Readings from Last 3 Encounters:  09/05/16 200 lb 4 oz (90.8 kg)  08/29/16 194 lb 8 oz (88.2 kg)  08/12/14 185 lb (83.9 kg)    PHYSICAL EXAM: General:  Well appearing AA male in NAD.  HEENT: Normal.  Neck: Supple. no JVD. Carotids 2+ bilat; no bruits. No lymphadenopathy or  thyromegaly appreciated. Cor: PMI nondisplaced. Regular, slightly tachy. No rubs or murmurs. ? +S3 vs S4.  Lungs: CTAB, normal effort.  Abdomen: Soft, nontender, nondistended. No hepatosplenomegaly. No bruits or masses. Good bowel sounds. Extremities: no cyanosis, clubbing, rash, edema Neuro: Alert & oriented x 3. Cranial nerves grossly intact. Moves all 4 extremities w/o difficulty. Affect pleasant    ECG: Reviewed personally, Sinus tachycardia 104 bpm, possible LAE, +LVH  ASSESSMENT & PLAN:  1. Systolic CHF with unclear etiology - Echo 08/22/16 LVEF 15%, Mod MR, Mod LAE, RV mild reduced, Mild RAE - Cath with no CAD and preserved output (on milrinone 0.125 mcg/kg/min). - NYHA II-III currently. Volume status looks great on exam.  - Continue lasix 40 mg daily for now. May need to decrease with Entresto.  - Continue coreg 6.25 mg BID.  - Stop losartan. Switch to Entresto 24/26 mg BID - NICM work up ongoing.  ? Etiology from heavy ETOH use of long standing HTN with LVH on EKG - Can eventually consider MRI once insurance arranged.  - Check SPEP, UPEP, ANA, hepatitis panel, and iron panel - HIV negative.  - OK to stop ASA with no CAD.  2. HTN - Long standing and poorly controlled. LVH on EKG - Meds as above.  3. HLD - 08/21/16 Total 159, TG 81, HDL 41, LDL 102 4. Polysubstance abuse - Tox panel + for [facility] and Cocaine on admission 08/2016. - He states he has not used cocaine since admission. Encouraged to remain off.  5 ETOH abuse - Drinks 3--6 beers daily and more on weekends. Encouraged stop, or cut back with plans to stop.  - Stressed difficult nature of treating ETOH cardiomyopathy, if this is etiology, especially with continued ETOH use.  6. Moderate MR - Moderate by Echo. Medical treatment for now.    Shirley Friar, PA-C 09/05/16   Greater than 50% of the 25 minute visit was spent in counseling/coordination of care regarding disease state education, medication  reconciliation, discussion of medical regimen with on site Pharm-D, and discussion of plan with Dr. Aundra Dubin.

## 2016-09-06 LAB — HEPATITIS PANEL, ACUTE
HEP A IGM: NEGATIVE
HEP B C IGM: NEGATIVE
Hepatitis B Surface Ag: NEGATIVE

## 2016-09-06 LAB — PROTEIN ELECTROPHORESIS, SERUM
A/G RATIO SPE: 1 (ref 0.7–1.7)
ALBUMIN ELP: 3.6 g/dL (ref 2.9–4.4)
ALPHA-2-GLOBULIN: 0.8 g/dL (ref 0.4–1.0)
Alpha-1-Globulin: 0.2 g/dL (ref 0.0–0.4)
Beta Globulin: 1.4 g/dL — ABNORMAL HIGH (ref 0.7–1.3)
GLOBULIN, TOTAL: 3.7 g/dL (ref 2.2–3.9)
Gamma Globulin: 1.2 g/dL (ref 0.4–1.8)
Total Protein ELP: 7.3 g/dL (ref 6.0–8.5)

## 2016-09-10 NOTE — Addendum Note (Signed)
Encounter addended by: Shirley Friar, PA-C on: 09/10/2016  4:49 PM<BR>    Actions taken: LOS modified

## 2016-09-11 ENCOUNTER — Telehealth: Payer: Self-pay | Admitting: Licensed Clinical Social Worker

## 2016-09-11 NOTE — Telephone Encounter (Signed)
SW Intern called pt after insurance referral to gather information, pt indicated he had been out of work for close to a year and his previous employer did not provide insurance. Pt stated he is unmarried and lives alone in his mothers old home, has no income and his sister pays all the bills for the house. Pt indicated he does live within [employer] and SW Intern encouraged pt to go to DSS and apply for Medicaid, pt verbalized understanding and stated he would be going as soon as possible to apply. SW and Clinic staff will continue to be available for pt needs. Janine Ores, SW Intern Raquel Sarna, [place] CCSW-MCS [phone]

## 2016-09-19 ENCOUNTER — Ambulatory Visit (HOSPITAL_COMMUNITY)
Admission: RE | Admit: 2016-09-19 | Discharge: 2016-09-19 | Disposition: A | Discharge status: Home / Self Care | Payer: Self-pay | Source: Ambulatory Visit | Attending: Internal Medicine | Admitting: Internal Medicine

## 2016-09-19 ENCOUNTER — Telehealth (HOSPITAL_COMMUNITY): Payer: Self-pay | Admitting: Pharmacist

## 2016-09-19 ENCOUNTER — Encounter (HOSPITAL_COMMUNITY): Payer: Self-pay

## 2016-09-19 VITALS — BP 122/90 | HR 111 | Wt 196.2 lb

## 2016-09-19 DIAGNOSIS — F1210 Cannabis abuse, uncomplicated: Secondary | ICD-10-CM | POA: Insufficient documentation

## 2016-09-19 DIAGNOSIS — I428 Other cardiomyopathies: Secondary | ICD-10-CM | POA: Insufficient documentation

## 2016-09-19 DIAGNOSIS — I426 Alcoholic cardiomyopathy: Secondary | ICD-10-CM | POA: Insufficient documentation

## 2016-09-19 DIAGNOSIS — I10 Essential (primary) hypertension: Secondary | ICD-10-CM

## 2016-09-19 DIAGNOSIS — E785 Hyperlipidemia, unspecified: Secondary | ICD-10-CM

## 2016-09-19 DIAGNOSIS — Z8249 Family history of ischemic heart disease and other diseases of the circulatory system: Secondary | ICD-10-CM | POA: Insufficient documentation

## 2016-09-19 DIAGNOSIS — I5042 Chronic combined systolic (congestive) and diastolic (congestive) heart failure: Secondary | ICD-10-CM

## 2016-09-19 DIAGNOSIS — Z86010 Personal history of colonic polyps: Secondary | ICD-10-CM | POA: Insufficient documentation

## 2016-09-19 DIAGNOSIS — I110 Hypertensive heart disease with heart failure: Secondary | ICD-10-CM | POA: Insufficient documentation

## 2016-09-19 DIAGNOSIS — F1410 Cocaine abuse, uncomplicated: Secondary | ICD-10-CM

## 2016-09-19 DIAGNOSIS — I340 Nonrheumatic mitral (valve) insufficiency: Secondary | ICD-10-CM

## 2016-09-19 DIAGNOSIS — Z833 Family history of diabetes mellitus: Secondary | ICD-10-CM | POA: Insufficient documentation

## 2016-09-19 DIAGNOSIS — F1010 Alcohol abuse, uncomplicated: Secondary | ICD-10-CM

## 2016-09-19 DIAGNOSIS — I5022 Chronic systolic (congestive) heart failure: Secondary | ICD-10-CM | POA: Insufficient documentation

## 2016-09-19 LAB — BASIC METABOLIC PANEL
ANION GAP: 12 (ref 5–15)
BUN: 18 mg/dL (ref 6–20)
CHLORIDE: 98 mmol/L — AB (ref 101–111)
CO2: 26 mmol/L (ref 22–32)
Calcium: 9.6 mg/dL (ref 8.9–10.3)
Creatinine, Ser: 1.08 mg/dL (ref 0.61–1.24)
GFR calc Af Amer: >60 mL/min (ref >60)
Glucose, Bld: 107 mg/dL — ABNORMAL HIGH (ref 65–99)
POTASSIUM: 4.6 mmol/L (ref 3.5–5.1)
SODIUM: 136 mmol/L (ref 135–145)

## 2016-09-19 MED ORDER — DIGOXIN 125 MCG PO TABS: 0.1250 mg | ORAL_TABLET | Freq: Every day | ORAL | 2 refills | Status: DC

## 2016-09-19 MED ORDER — SPIRONOLACTONE 25 MG PO TABS: 12.5000 mg | ORAL_TABLET | Freq: Every day | ORAL | 2 refills | Status: DC

## 2016-09-19 MED FILL — DIGOXIN 0.125 MG TABLET: 125 | 30 days supply | Qty: 30 | Fill #0

## 2016-09-19 MED FILL — SPIRONOLACTONE 25 MG TABLET: 25 | 30 days supply | Qty: 15 | Fill #0

## 2016-09-19 NOTE — Patient Instructions (Signed)
Routine lab work today. Will notify you of abnormal results, otherwise no news is good news!  START Digoxon 0.125 mg tablet once daily. Supplied by Heart Failure Fund at [facility].  START Spironolactone 12.5 mg (1/2 tablet) once daily. Supplied by Heart Failure Fund at [facility].  Return in 1-2 weeks for lab work.  ________________________________________________________  ________________________________________________________  Follow up with Ileene Patrick in 3-4 weeks.  ________________________________________________________  ________________________________________________________  Follow up with Dr. Aundra Dubin in 6-8 weeks.  ________________________________________________________  ________________________________________________________  Do the following things EVERYDAY: 1) Weigh yourself in the morning before breakfast. Write it down and keep it in a log. 2) Take your medicines as prescribed 3) Eat low salt foods-Limit salt (sodium) to 2000 mg per day.  4) Stay as active as you can everyday 5) Limit all fluids for the day to less than 2 liters

## 2016-09-19 NOTE — Progress Notes (Signed)
Advanced Heart Failure Clinic Note   Primary Cardiologist: Dr Harrington Challenger HF: New (Dr. Aundra Dubin)  HPI:  [name] [name] is a 46 y.o. male with HTN, HLD, Systolic CHF, and polysubstance abuse (Cocaine, THC, and ETOH)  Presented to [facility] 08/20/16 with worsening dyspnea and CP. Newly diagnosed with Systolic CHF with EF 19% on echo. Cath as below with normal coronaries and preserved cardiac output. Initial coox 50.5% so transiently placed on milrinone 0.125 mcg/kg/min. Pt tolerated wean with diuresis and adjustment of medications.   Presents today for regular follow up. At last visit switched to [facility].  Some fatigued but otherwise feels good. Weight down 4 lbs from last visit. Denies DOE on flat ground. Mild SOB if he gets in a hurry. Denies lightheadedness or dizziness. Denies exertional CP, still with some heaviness when he gets SOB. Having 1-2 beers a day.  Drinks more on the weekend. Was running around outside with his dog yesterday and was fatigued, but not really SOB. No further cocaine use since PTA. Weight at home ~196-200. Taking all medications as directed.   Echo 08/22/16 LVEF 15%, Mod MR, Mod LAE, RV mild reduced, Mild RAE  [facility] 08/27/16 RHC Procedural Findings: Hemodynamics (mmHg) RA mean 1 RV 23/0 PA 22/11 PCWP 15 AO 92/74 Fick CI 2.7  Review of systems complete and found to be negative unless listed in HPI.    Past medical History 1. Systolic CHF - new diagnosis 08/2016 - Cath with no CAD and preserved output (on milrinone 0.125 mcg/kg/min). - NICM work up ongoing.  ? Etiology from heavy ETOH use of long standing HTN with LVH on EKG 2. HTN - Long standing and poorly controlled. LVH on EKG 3. HLD - 08/21/16 Total 159, TG 81, HDL 41, LDL 102 4. Polysubstance abuse - Tox panel + for [facility] and Cocaine on admission 08/2016. 5. Moderate MR - By echo 08/2016 6. ETOH abuse - Drinking 3-6 beers daily, more on weekends. Has been counseled on importance of stopping completely.   Past  Medical History:  Diagnosis Date  . COLONIC POLYPS, BENIGN 01/20/2010   Annotation: 12/2009: path benign; rpt 2016 Qualifier: Diagnosis of  By: Jorene Minors, Scott    . Hypertension     Current Outpatient Prescriptions  Medication Sig Dispense Refill  . carvedilol (COREG) 6.25 MG tablet Take 1 tablet (6.25 mg total) by mouth 2 (two) times daily with a meal. 60 tablet 0  . furosemide (LASIX) 40 MG tablet Take 1 tablet (40 mg total) by mouth daily. 30 tablet 0  . sacubitril-valsartan (ENTRESTO) 24-26 MG Take 1 tablet by mouth 2 (two) times daily. 60 tablet 11  . thiamine 100 MG tablet Take 1 tablet (100 mg total) by mouth daily. 30 tablet 0   No current facility-administered medications for this encounter.     No Known Allergies    Social History   Social History  . Marital status: Single    Spouse name: N/A  . Number of children: N/A  . Years of education: N/A   Occupational History  . Not on file.   Social History Main Topics  . Smoking status: Never Smoker  . Smokeless tobacco: Never Used  . Alcohol use Yes     Comment: (2) 24 oz beers every other day pt reports  . Drug use: Yes    Types: Marijuana     Comment: pt reports marijuana use occasionally  . Sexual activity: Yes    Birth control/ protection: None   Other  Topics Concern  . Not on file   Social History Narrative  . No narrative on file      Family History  Problem Relation Age of Onset  . Diabetes Other   . Hypertension Other     Vitals:   09/19/16 0937  BP: 122/90  Pulse: (!) 111  SpO2: 100%  Weight: 196 lb 3.2 oz (89 kg)   Wt Readings from Last 3 Encounters:  09/19/16 196 lb 3.2 oz (89 kg)  09/05/16 200 lb 4 oz (90.8 kg)  08/29/16 194 lb 8 oz (88.2 kg)    PHYSICAL EXAM: General: Well appearing. No resp difficulty. HEENT: normal Neck: supple. JVD 6-7. Carotids 2+ bilat; no bruits. No thyromegaly or nodule noted. Cor: PMI nondisplaced. Regular, slightly tachy.  No rubs or murmurs. ?  S3 Lungs: CTAB, normal effort. Abdomen: soft, non-tender, distended, no HSM. No bruits or masses. +BS  Extremities: no cyanosis, clubbing, rash, R and LLE no edema.  Neuro: alert & orientedx3, cranial nerves grossly intact. moves all 4 extremities w/o difficulty. Affect pleasant   ECG: Personally reviewed, Sinus tachycardia 103 bpm. RAE. + LVH  ASSESSMENT & PLAN:  1. Systolic CHF with unclear etiology - Echo 08/22/16 LVEF 15%, Mod MR, Mod LAE, RV mild reduced, Mild RAE - Cath with no CAD and preserved output (on milrinone 0.125 mcg/kg/min). - NYHA II-III. More limited from fatigued and SOB.  - Continue lasix 40 mg daily   - Continue coreg 6.25 mg BID.  - Continue Entresto 24/26 mg BID. BMET today.  - Start spiro 12.5 mg daily. BMET today and repeat 10 days.  - Start digoxin 0.125 mg daily. Will follow level.  - NICM work up ongoing.  ? Etiology from heavy ETOH use of long standing HTN with LVH on EKG - Can eventually consider MRI once insurance arranged.  -  Check ANA   - Iron panel WNL. SPEP negative.  - HIV negative. Hepatitis panel negative.  - OK to stop ASA with no CAD.  - Reinforced fluid restriction to < 2 L daily, sodium restriction to less than 2000 mg daily, and the importance of daily weights.   2. HTN - Improved with med adjustment. Meds as above.  3. HLD - 08/21/16 Total 159, TG 81, HDL 41, LDL 102 4. Polysubstance abuse - Tox panel + for [facility] and Cocaine on admission 08/2016. - He states that he remains abstinent from cocaine from since recent hospitalization.  5 ETOH abuse - Trying to cut back.  Drinking 1-2 beers daily and more on weekends.  - Again stressed difficult nature of treating ETOH cardiomyopathy, if this is etiology, especially with continued ETOH use.  6. Moderate MR - Moderate by Echo. Medical treatment for now. No change.    Shirley Friar, PA-C 09/19/16   Greater than 50% of the 25 minute visit was spent in counseling/coordination of care  regarding disease state education, ETOH cessation, substance abuse, and salt/fluid restriction.

## 2016-09-19 NOTE — Telephone Encounter (Signed)
[employer] patient assistance approved for [employer] 24-26 mg BID through 09/19/17.   Ruta Hinds. Velva Harman, PharmD, BCPS, CPP Clinical Pharmacist Pager: [phone] Phone: [phone] 09/19/2016 4:45 PM

## 2016-09-19 NOTE — Addendum Note (Signed)
Encounter addended by: Kerry Dory, CMA on: 09/19/2016 10:35 AM<BR>    Actions taken: Order list changed, Diagnosis association updated

## 2016-09-24 LAB — MISC LABCORP TEST (SEND OUT): LABCORP TEST CODE: 3715

## 2016-10-03 ENCOUNTER — Ambulatory Visit (HOSPITAL_COMMUNITY)
Admission: RE | Admit: 2016-10-03 | Discharge: 2016-10-03 | Disposition: A | Discharge status: Home / Self Care | Payer: Self-pay | Source: Ambulatory Visit | Attending: Internal Medicine | Admitting: Internal Medicine

## 2016-10-03 DIAGNOSIS — I5042 Chronic combined systolic (congestive) and diastolic (congestive) heart failure: Secondary | ICD-10-CM | POA: Insufficient documentation

## 2016-10-03 LAB — BASIC METABOLIC PANEL
Anion gap: 8 (ref 5–15)
BUN: 8 mg/dL (ref 6–20)
CALCIUM: 8.9 mg/dL (ref 8.9–10.3)
CO2: 26 mmol/L (ref 22–32)
CREATININE: 0.88 mg/dL (ref 0.61–1.24)
Chloride: 101 mmol/L (ref 101–111)
GFR calc non Af Amer: >60 mL/min (ref >60)
GLUCOSE: 103 mg/dL — AB (ref 65–99)
Potassium: 4.1 mmol/L (ref 3.5–5.1)
Sodium: 135 mmol/L (ref 135–145)

## 2016-10-03 LAB — DIGOXIN LEVEL: Digoxin Level: 0.5 ng/mL — ABNORMAL LOW (ref 0.8–2.0)

## 2016-10-09 ENCOUNTER — Ambulatory Visit (HOSPITAL_COMMUNITY): Payer: Self-pay

## 2016-10-10 ENCOUNTER — Ambulatory Visit (HOSPITAL_COMMUNITY): Payer: Self-pay

## 2016-11-05 ENCOUNTER — Ambulatory Visit (HOSPITAL_COMMUNITY)
Admission: RE | Admit: 2016-11-05 | Discharge: 2016-11-05 | Disposition: A | Discharge status: Home / Self Care | Payer: Self-pay | Source: Ambulatory Visit | Attending: Internal Medicine | Admitting: Internal Medicine

## 2016-11-05 VITALS — BP 132/96 | HR 111 | Wt 192.4 lb

## 2016-11-05 DIAGNOSIS — I110 Hypertensive heart disease with heart failure: Secondary | ICD-10-CM | POA: Insufficient documentation

## 2016-11-05 DIAGNOSIS — Z79899 Other long term (current) drug therapy: Secondary | ICD-10-CM | POA: Insufficient documentation

## 2016-11-05 DIAGNOSIS — I5042 Chronic combined systolic (congestive) and diastolic (congestive) heart failure: Secondary | ICD-10-CM

## 2016-11-05 DIAGNOSIS — F1910 Other psychoactive substance abuse, uncomplicated: Secondary | ICD-10-CM | POA: Insufficient documentation

## 2016-11-05 DIAGNOSIS — F1010 Alcohol abuse, uncomplicated: Secondary | ICD-10-CM | POA: Insufficient documentation

## 2016-11-05 DIAGNOSIS — E785 Hyperlipidemia, unspecified: Secondary | ICD-10-CM | POA: Insufficient documentation

## 2016-11-05 DIAGNOSIS — I5022 Chronic systolic (congestive) heart failure: Secondary | ICD-10-CM | POA: Insufficient documentation

## 2016-11-05 LAB — BASIC METABOLIC PANEL
ANION GAP: 9 (ref 5–15)
BUN: 10 mg/dL (ref 6–20)
CHLORIDE: 103 mmol/L (ref 101–111)
CO2: 26 mmol/L (ref 22–32)
Calcium: 8.9 mg/dL (ref 8.9–10.3)
Creatinine, Ser: 1.27 mg/dL — ABNORMAL HIGH (ref 0.61–1.24)
GFR calc Af Amer: >60 mL/min (ref >60)
Glucose, Bld: 107 mg/dL — ABNORMAL HIGH (ref 65–99)
POTASSIUM: 3.9 mmol/L (ref 3.5–5.1)
SODIUM: 138 mmol/L (ref 135–145)

## 2016-11-05 MED ORDER — FUROSEMIDE 20 MG PO TABS: 20.0000 mg | ORAL_TABLET | Freq: Every day | ORAL | 2 refills | Status: DC

## 2016-11-05 MED ORDER — SACUBITRIL-VALSARTAN 49-51 MG PO TABS: 1.0000 | ORAL_TABLET | Freq: Two times a day (BID) | ORAL | 11 refills | Status: DC

## 2016-11-05 MED FILL — FUROSEMIDE 20 MG TABLET: 20 | 50 days supply | Qty: 100 | Fill #0

## 2016-11-05 NOTE — Patient Instructions (Addendum)
Thank you for coming in today!  Please DECREASE your furosemide to 20 mg once a day with an additional 20 mg if you see an increase in weight by 3 lbs in 1 day or 5 lbs in 1 week.   Please INCREASE your Entresto to 49/51 mg TWICE A DAY.   Please use the medication list we are printing off for you today.   Please keep your appointment with Dr. Aundra Dubin on 11/16/2016 at 9:40 AM.

## 2016-11-05 NOTE — Progress Notes (Signed)
HF MD: DR. Aundra Dubin  HPI:  [name] [name] is a 46 y.o. AA male with HTN, HLD, Systolic CHF, and polysubstance abuse (Cocaine, THC, and ETOH).  Presented to [facility] 08/20/16 with worsening dyspnea and CP. Newly diagnosed with Systolic CHF with EF 70% on echo. Cath with normal coronaries and preserved cardiac output. Initial coox 50.5% so transiently placed on milrinone 0.125 mcg/kg/min. Pt tolerated wean off with diuresis and no longer on milrinone infusion.   Pt presents today with flat affect/depressed mood for pharmacist led HF medication titration. At last HF clinic visit on 09/19/16, spironolactone 12.5 mg daily and digoxin 0.125 mg daily were added. He spoke with [name] [name] during the visit who discovered that his mother, who he solely supported, passed away late 2015/09/12. He is currently trying to get Medicaid and has no PCP. [name] [name] assisted pt in identifying one. He reports taking all medications as prescribed and brought all pill bottles to appointment today. Overall doing well, reported SOB and dizziness that have been persistent since his hospital admission in April. This has worsened with the recent hot weather especially when mowing lawn. Denies cocaine use and only occasional alcohol consumption now. He reports that it has been hard to stay with the 2L fluid restriction with the heat.  Echo 08/22/16 LVEF 15%, Mod MR, Mod LAE, RV mild reduced, Mild RAE    . Shortness of breath/dyspnea on exertion? Yes, come about with activity such as mowing the lawn or chores inside the house (same as usual). Reports that he can walk 1 block without getting SOB. Reports SOB has been worse since it has been hot outside.  . Orthopnea/PND? No . Edema? no . Lightheadedness/dizziness? Yes, reports it occurs when he is SOB. Denies darkness of vision. Reports some nausea with dizziness. Reports it can be worse if he works outside midday. Persistent and constant since hospitalization.  . Daily weights at home? Yes - 194  consistently at home . Blood pressure/heart rate monitoring at home? no . Following low-sodium/fluid-restricted diet? yes  HF Medications: Coreg 6.25 mg PO BID Digoxin 0.125 mg PO daily Lasix 40mg  PO daily Entresto 24/26mg  PO BID Spironolactone 12.5mg  PO daily  Has the patient been experiencing any side effects to the medications prescribed?  Yes - dizziness/lightheadedness 2/2 ?dehydration  Does the patient have any problems obtaining medications due to transportation or finances?   Yes - using heart failure fund currently, working on [employer] - spoke with [name] [name] today about obtaining a PCP  Understanding of regimen: good Understanding of indications: good Potential of compliance: good Patient understands to avoid NSAIDs. Patient understands to avoid decongestants.    Pertinent Lab Values: . 11/05/16: Serum creatinine 1.27 (BL ~0.8-1), BUN 10, Potassium 3.9, Sodium 138 . 10/03/16: Digoxin 0.5   Vital Signs: . Weight: 192 (dry weight: 190-195) . Blood pressure: 132/96 mmHg  . Heart rate: 111 bpm   Assessment: 1. Chronicsystolic CHF (EF 62% on ECHO 08/22/16), due to NICM. NYHA class IIIsymptoms.  - Volume status stable on exam. SCr elevated and pt reports that dizziness and SOB have been worse with hot weather. Instructed pt that he could increase his fluid consumption, especially during the time that he is losing water when he is sweating.  - Decrease furosemide to 20 mg daily with additional 20 mg for weight gain > 3 lb in 1 day or > 5 lb in 1 week  - Increase Entresto to 49-51 mg BID   - Continue digoxin 0.125 mg and spironolactone 12.5mg   PO daily  - Will not increase BB at this time with recent low output and ?cocaine abuse - Basic disease state pathophysiology, medication indication, mechanism and side effects reviewed at length with patient and he verbalized understanding. 2. HTN  - BP still slightly above goal <130/80 mmHg  - Increasing Entresto as above 3. HLD  -  Lipid panel on 08/21/16 - Total 159, TG 81, HDL 41, LDL 102 4. Polysubstance abuse - Tox panel + for [facility] and Cocaine on admission 08/2016. - He states that he remains abstinent from cocaine from since recent hospitalization.  5 ETOH abuse - Trying to cut back.  Drinking 1-2 beers daily and more on weekends.  - Again stressed difficult nature of treating ETOH cardiomyopathy, if this is etiology, especially with continued ETOH use.  6. Moderate MR - Moderate by Echo. Medical treatment for now. No change.     Plan: 1) Medication changes: Based on clinical presentation, vital signs and recent labs will increase Entresto to 49-51 mg BID, decrease furosemide to 20 mg daily with additional 20 mg for weight gain > 3 lb in 1 day or > 5 lb in 1 week, and continue coreg 6.25mg  bid, digoxin 0.125 mg daily and spironolactone 12.5mg  PO daily. 2) Labs: BMET today and at f/u visit 3) Follow-up: 11/16/2016 with Dr. Aundra Dubin  Patient seen with: Phillis Knack, PharmD Candidate Cruz Condon, PharmD, PGY-2 Cardiology  Agree with above.  Ruta Hinds. Velva Harman, PharmD, BCPS, CPP Clinical Pharmacist Pager: [phone] Phone: [phone] 11/05/2016 2:38 PM

## 2016-11-06 NOTE — Progress Notes (Signed)
CSW referred to assist with insurance issues. Patient reports he has no income and a pending medicaid application. Patient shared his story and spoke at length about his recent loss of his mother. Patient cared for his mother who passed away on hospice in 04-20-2016. Patient reports he is currently residing in her home and his sister assist with the financial part as he currently has no income. CSW provided supportive intervention and encouraged patient to access the bereavement services through hospice. Patient verbalized and states he is aware of services. Patient also mentioned appointment tomorrow with medicaid and hopeful to make some progress on the application. CSW discussed the [facility] card and IM clinic to bring the gap until approval/decision on medicaid application. CSW obtained an appointment for next week and patient verbalizes understanding and follow up needed. CSW available as needed. Raquel Sarna, [place], [place]

## 2016-11-07 MED FILL — SPIRONOLACTONE 25 MG TABLET: 25 | 30 days supply | Qty: 15 | Fill #1

## 2016-11-07 MED FILL — DIGOXIN 0.125 MG TABLET: 125 | 30 days supply | Qty: 30 | Fill #1

## 2016-11-14 ENCOUNTER — Encounter: Payer: Self-pay | Admitting: Internal Medicine

## 2016-11-14 ENCOUNTER — Ambulatory Visit (INDEPENDENT_AMBULATORY_CARE_PROVIDER_SITE_OTHER): Payer: Self-pay | Admitting: Internal Medicine

## 2016-11-14 VITALS — BP 132/89 | HR 84 | Temp 98.0°F | Wt 195.0 lb

## 2016-11-14 DIAGNOSIS — I10 Essential (primary) hypertension: Secondary | ICD-10-CM

## 2016-11-14 DIAGNOSIS — I110 Hypertensive heart disease with heart failure: Secondary | ICD-10-CM

## 2016-11-14 DIAGNOSIS — Z86010 Personal history of colonic polyps: Secondary | ICD-10-CM

## 2016-11-14 DIAGNOSIS — I428 Other cardiomyopathies: Secondary | ICD-10-CM

## 2016-11-14 DIAGNOSIS — I5022 Chronic systolic (congestive) heart failure: Secondary | ICD-10-CM

## 2016-11-14 NOTE — Assessment & Plan Note (Addendum)
History of present illness Patient has a history of NYHA class II-III systolic congestive heart failure diagnosed in April 2018. He reports having shortness of breath with minimal exertion; example: mowing his lawn, walking for 1 block, or climbing 2 flights of stairs. Denies having any orthopnea, paroxysmal nocturnal dyspnea, or lower extremity edema. Reports having occasional chest tightness with exertion. States he used to drink heavily until his hospitalization in April. Reports drinking a 12 pack of beer every other day for the past 4-5 years. At present, he is drinking 6-7 beers per week. His current medication regimen includes Lasix 20 mg daily, Coreg 6.25 mg twice daily, Entresto 49/51 mg twice daily, digoxin 0.125 mg daily, and spironolactone 12.5 mg daily.  Assessment Systolic congestive heart failure secondary to non-ischemic cardiomyopathy in the setting of long-standing hypertension (LVH on EKG) and prior heavy ethanol use. Echo done on 08/22/2016 showing left ventricular ejection fraction 15% and severe global hypokinesis. Cardiac cath done 08/27/2016 showing normal coronary arteries. He continues to have dyspnea with minimal exertion and occasional chest tightness. Currently euvolemic on exam. He has not gained weight per chart review.  Plan -Continue current medical management -Advised him to cut down on his ethanol use -Monitor his weight at home -Encouraged him to go to his appointment with cardiology on 11/16/2016

## 2016-11-14 NOTE — Assessment & Plan Note (Addendum)
BP Readings from Last 3 Encounters:  11/14/16 132/89  11/05/16 (!) 132/96  09/19/16 122/90    Lab Results  Component Value Date   NA 138 11/05/2016   K 3.9 11/05/2016   CREATININE 1.27 (H) 11/05/2016    Assessment: Blood pressure control:  well-controlled Comments: His current medication regimen includes Lasix 20 mg daily, Coreg 6.25 mg twice daily, Entresto 49/51 mg twice daily, and spironolactone 12.5 mg daily.  Plan: Medications:  continue current medications Educational resources provided:   Educated patient about healthy eating.

## 2016-11-14 NOTE — Progress Notes (Signed)
   CC: Patient is new to the clinic and is here to establish care. Heart failure and hypertension were discussed during this visit.  HPI:  Mr.[name] [name] is a 46 y.o. male with a past medical history of conditions listed below presenting to the clinic as a new patient and is here to establish care. Heart failure and hypertension were discussed during this visit. Please see problem based charting for the status of the patient's current and chronic medical conditions.   Past Medical History:  Diagnosis Date  . COLONIC POLYPS, BENIGN 01/20/2010   Annotation: 12/2009: path benign; rpt 2016 Qualifier: Diagnosis of  By: Jorene Minors, Scott    . Hypertension     Review of Systems: Pertinent positives mentioned in HPI. Remainder of all ROS negative.   Physical Exam:  Vitals:   11/14/16 1334  BP: 132/89  Pulse: 84  Temp: 98 F (36.7 C)  TempSrc: Oral  SpO2: 99%  Weight: 195 lb (88.5 kg)   Physical Exam  Constitutional: He is oriented to person, place, and time. He appears well-developed and well-nourished. No distress.  HENT:  Head: Normocephalic and atraumatic.  Mouth/Throat: Oropharynx is clear and moist.  Eyes: Right eye exhibits no discharge. Left eye exhibits no discharge.  Neck: Neck supple. No JVD present. No tracheal deviation present.  Cardiovascular: Normal rate, regular rhythm and intact distal pulses.  Exam reveals no gallop and no friction rub.   No murmur heard. Pulmonary/Chest: Effort normal and breath sounds normal. No respiratory distress. He has no wheezes. He has no rales.  Abdominal: Soft. Bowel sounds are normal. He exhibits no distension. There is no tenderness.  Musculoskeletal: He exhibits no edema.  Neurological: He is alert and oriented to person, place, and time.  Skin: Skin is warm and dry.    Assessment & Plan:   See Encounters Tab for problem based charting.  Patient discussed with Dr. Dareen Piano

## 2016-11-14 NOTE — Patient Instructions (Addendum)
Mr. Kunath it was nice meeting you today.  -Continue taking your medications as before  -Please go to your appointment with cardiology on 11/16/2016  -Return to the clinic for a follow-up visit in 3 months.

## 2016-11-15 NOTE — Progress Notes (Signed)
Internal Medicine Clinic Attending  Case discussed with Dr. Rathoreat the time of the visit. We reviewed the resident's history and exam and pertinent patient test results. I agree with the assessment, diagnosis, and plan of care documented in the resident's note.  

## 2016-11-16 ENCOUNTER — Ambulatory Visit (HOSPITAL_COMMUNITY)
Admission: RE | Admit: 2016-11-16 | Discharge: 2016-11-16 | Disposition: A | Discharge status: Home / Self Care | Payer: Self-pay | Source: Ambulatory Visit | Attending: Cardiology | Admitting: Cardiology

## 2016-11-16 ENCOUNTER — Encounter (HOSPITAL_COMMUNITY): Payer: Self-pay

## 2016-11-16 VITALS — BP 115/76 | HR 88 | Wt 191.5 lb

## 2016-11-16 DIAGNOSIS — I428 Other cardiomyopathies: Secondary | ICD-10-CM

## 2016-11-16 DIAGNOSIS — F1410 Cocaine abuse, uncomplicated: Secondary | ICD-10-CM

## 2016-11-16 DIAGNOSIS — I110 Hypertensive heart disease with heart failure: Secondary | ICD-10-CM | POA: Insufficient documentation

## 2016-11-16 DIAGNOSIS — I340 Nonrheumatic mitral (valve) insufficiency: Secondary | ICD-10-CM

## 2016-11-16 DIAGNOSIS — I5022 Chronic systolic (congestive) heart failure: Secondary | ICD-10-CM | POA: Insufficient documentation

## 2016-11-16 DIAGNOSIS — F1010 Alcohol abuse, uncomplicated: Secondary | ICD-10-CM

## 2016-11-16 DIAGNOSIS — E785 Hyperlipidemia, unspecified: Secondary | ICD-10-CM | POA: Insufficient documentation

## 2016-11-16 DIAGNOSIS — Z79899 Other long term (current) drug therapy: Secondary | ICD-10-CM | POA: Insufficient documentation

## 2016-11-16 MED ORDER — CARVEDILOL 6.25 MG PO TABS
9.3750 mg | ORAL_TABLET | Freq: Two times a day (BID) | ORAL | 0 refills | Status: DC
Start: 2016-11-16 — End: 2016-11-16

## 2016-11-16 MED ORDER — CARVEDILOL 6.25 MG PO TABS
9.3750 mg | ORAL_TABLET | Freq: Two times a day (BID) | ORAL | 0 refills | Status: DC
Start: 2016-11-16 — End: 2016-12-24

## 2016-11-16 MED ORDER — SPIRONOLACTONE 25 MG PO TABS: 25.0000 mg | ORAL_TABLET | Freq: Every day | ORAL | 2 refills | Status: DC

## 2016-11-16 MED FILL — CARVEDILOL 6.25 MG TABLET: 6.25 | 30 days supply | Qty: 90 | Fill #0

## 2016-11-16 MED FILL — SPIRONOLACTONE 25 MG TABLET: 25 | 30 days supply | Qty: 30 | Fill #0

## 2016-11-16 NOTE — Patient Instructions (Signed)
INCREASE Carvedilol to 9.375 (1 & 1/2 tablets) twice daily.  INCREASE Spironolactone to 25mg  daily.  Labs in 10 days (bmet, bnp, digoxin)  Follow up in 1 month.

## 2016-11-17 NOTE — Progress Notes (Signed)
Advanced Heart Failure Clinic Note   Primary Cardiologist: Dr Harrington Challenger HF Cardiology: Dr Aundra Dubin  HPI:  [name] [name] is a 46 y.o. male with HTN, HLD, Systolic CHF, and polysubstance abuse (Cocaine, THC, and ETOH)  Presented to [facility] 08/20/16 with worsening dyspnea and CP.  He was diagnosed with systolic CHF with EF 10% on echo. Cath as below with normal coronaries and preserved cardiac output. Initial coox 50.5% so transiently placed on milrinone 0.125 mcg/kg/min. Pt tolerated wean with diuresis and adjustment of medications.   Presents today for regular follow up. He is no longer using cocaine. He very rarely drinks ETOH.  He has been taking all of his meds except ran out of Coreg a couple days ago.  He is short of breath after walking 1/2 block or walking up 2 flights of stairs.   No chest pain.  No orthopnea/PND.  He is out of work, used to work in a [employer].    Review of systems complete and found to be negative unless listed in HPI.    Past medical History 1. Chronic systolic CHF: Nonischemic cardiomyopathy, heavy ETOH use versus long-standing HTN. New diagnosis 08/2016 . HIV negative, SPEP negative.  - LHC (4/18) with no CAD. - Echo (4/18): LVEF 15%, Mod MR, Mod LAE, RV mild reduced, Mild RAE.  - RHC (4/18): mean RA 1, PA 22/11, mean PCWP 15, CI 2.7 (on milrinone 0.125) - NICM work up ongoing.  ? Etiology from heavy ETOH use of long standing HTN with LVH on EKG 2. HTN:  Long standing and poorly controlled. LVH on EKG 3. Hyperlipidemia 4. Polysubstance abuse: h/o cocaine and marijuana.  5. MR: Moderate by 4/18 echo, likely functional.  6. ETOH abuse: Has cut back.   Current Outpatient Prescriptions  Medication Sig Dispense Refill  . digoxin (LANOXIN) 0.125 MG tablet Take 1 tablet (0.125 mg total) by mouth daily. 30 tablet 2  . furosemide (LASIX) 20 MG tablet Take 1 tablet (20 mg total) by mouth daily. Take additional 20 mg tab for weight gain >3 lb in 1 day or >5 lb in 1 week 60  tablet 2  . sacubitril-valsartan (ENTRESTO) 49-51 MG Take 1 tablet by mouth 2 (two) times daily. 60 tablet 11  . spironolactone (ALDACTONE) 25 MG tablet Take 1 tablet (25 mg total) by mouth daily. 30 tablet 2  . thiamine 100 MG tablet Take 1 tablet (100 mg total) by mouth daily. 30 tablet 0  . carvedilol (COREG) 6.25 MG tablet Take 1.5 tablets (9.375 mg total) by mouth 2 (two) times daily with a meal. 90 tablet 0   No current facility-administered medications for this encounter.     No Known Allergies    Social History   Social History  . Marital status: Single    Spouse name: N/A  . Number of children: N/A  . Years of education: N/A   Occupational History  . Not on file.   Social History Main Topics  . Smoking status: Never Smoker  . Smokeless tobacco: Never Used  . Alcohol use Yes     Comment: (2) 24 oz beers every other day pt reports  . Drug use: Yes    Types: Marijuana     Comment: pt reports marijuana use occasionally  . Sexual activity: Yes    Birth control/ protection: None   Other Topics Concern  . Not on file   Social History Narrative  . No narrative on file      Family  History  Problem Relation Age of Onset  . Diabetes Other   . Hypertension Other     Vitals:   11/16/16 0939  BP: 115/76  Pulse: 88  SpO2: 98%  Weight: 191 lb 8 oz (86.9 kg)   Wt Readings from Last 3 Encounters:  11/16/16 191 lb 8 oz (86.9 kg)  11/14/16 195 lb (88.5 kg)  11/05/16 192 lb 6.4 oz (87.3 kg)    PHYSICAL EXAM: General: Well appearing. No resp difficulty. HEENT: normal Neck: supple. JVP not elevated. Carotids 2+ bilat; no bruits. No thyromegaly or nodule noted. Cor: PMI nondisplaced. Regular, slightly tachy.  No rubs or murmurs. No S3/S4.  Lungs: CTAB, normal effort. Abdomen: soft, non-tender, distended, no HSM. No bruits or masses. +BS  Extremities: no cyanosis, clubbing, rash, R and LLE no edema.  Neuro: alert & orientedx3, cranial nerves grossly intact. moves  all 4 extremities w/o difficulty. Affect pleasant   ASSESSMENT & PLAN: 1. Chronic systolic CHF: Nonischemic cardiomyopathy.  Possibly due to ETOH abuse versus long-standing HTN.  Echo (4/18) LVEF 15%, Mod MR, Mod LAE, RV mild reduced, Mild RAE.  Cath with no CAD.  NYHA class II-III.  He is not volume overloaded on exam.  - Continue lasix 20 mg daily   - Increase Coreg to 9.375 mg bid.   - Continue Entresto 24/26 mg BID. - Increase spironolactone to 25 mg daily, BMET/BNP in 10 days.  - Continue digoxin, check level with next labs.   - Abstain from ETOH. - Cardiac MRI versus echo (depending on insurance coverage - waiting on Medicaid) in 10/18 to decide on need for ICD.   2. HTN: Improved with med adjustment. Meds as above.  3. Polysubstance abuse: Tox panel + for [facility] [place] and Cocaine on admission 08/2016. - He states that he remains abstinent from cocaine from since hospitalization.  4 ETOH abuse: He has cut back a lot and rarely drinks now.  I encouraged him to stay off ETOH altogether. 5. Moderate MR: Suspect functional.   Followup in 1 month with PA.   Loralie Champagne, MD 11/17/16

## 2016-11-27 ENCOUNTER — Ambulatory Visit (HOSPITAL_COMMUNITY)
Admission: RE | Admit: 2016-11-27 | Discharge: 2016-11-27 | Disposition: A | Discharge status: Home / Self Care | Payer: Self-pay | Source: Ambulatory Visit | Attending: Internal Medicine | Admitting: Internal Medicine

## 2016-11-27 DIAGNOSIS — I428 Other cardiomyopathies: Secondary | ICD-10-CM | POA: Insufficient documentation

## 2016-11-27 LAB — BASIC METABOLIC PANEL
Anion gap: 10 (ref 5–15)
BUN: 12 mg/dL (ref 6–20)
CO2: 27 mmol/L (ref 22–32)
Calcium: 9.4 mg/dL (ref 8.9–10.3)
Chloride: 99 mmol/L — ABNORMAL LOW (ref 101–111)
Creatinine, Ser: 0.9 mg/dL (ref 0.61–1.24)
GFR calc Af Amer: >60 mL/min (ref >60)
GFR calc non Af Amer: >60 mL/min (ref >60)
Glucose, Bld: 109 mg/dL — ABNORMAL HIGH (ref 65–99)
POTASSIUM: 3.9 mmol/L (ref 3.5–5.1)
SODIUM: 136 mmol/L (ref 135–145)

## 2016-11-27 LAB — BRAIN NATRIURETIC PEPTIDE: B Natriuretic Peptide: 30.3 pg/mL (ref 0.0–100.0)

## 2016-11-27 LAB — DIGOXIN LEVEL: Digoxin Level: 0.7 ng/mL — ABNORMAL LOW (ref 0.8–2.0)

## 2016-12-06 ENCOUNTER — Encounter (HOSPITAL_COMMUNITY): Payer: Self-pay

## 2016-12-06 MED FILL — DIGOXIN 0.125 MG TABLET: 125 | 30 days supply | Qty: 30 | Fill #2

## 2016-12-06 NOTE — Progress Notes (Signed)
[employer] Disability claim completed, all requested medical record forms for time period 2017-present from our office/providers/service faxed to provided # [phone] (18 pages total). Copy of request scanned in to patient's electronic medical record.  Renee Pain, RN

## 2016-12-18 ENCOUNTER — Ambulatory Visit (HOSPITAL_COMMUNITY)
Admission: RE | Admit: 2016-12-18 | Discharge: 2016-12-18 | Disposition: A | Discharge status: Home / Self Care | Payer: Self-pay | Source: Ambulatory Visit | Attending: Cardiology | Admitting: Cardiology

## 2016-12-18 ENCOUNTER — Telehealth (HOSPITAL_COMMUNITY): Payer: Self-pay

## 2016-12-18 VITALS — BP 122/86 | HR 91 | Wt 187.0 lb

## 2016-12-18 DIAGNOSIS — R0602 Shortness of breath: Secondary | ICD-10-CM | POA: Insufficient documentation

## 2016-12-18 DIAGNOSIS — F1210 Cannabis abuse, uncomplicated: Secondary | ICD-10-CM | POA: Insufficient documentation

## 2016-12-18 DIAGNOSIS — F1010 Alcohol abuse, uncomplicated: Secondary | ICD-10-CM | POA: Insufficient documentation

## 2016-12-18 DIAGNOSIS — I5022 Chronic systolic (congestive) heart failure: Secondary | ICD-10-CM | POA: Insufficient documentation

## 2016-12-18 DIAGNOSIS — I428 Other cardiomyopathies: Secondary | ICD-10-CM

## 2016-12-18 DIAGNOSIS — F1410 Cocaine abuse, uncomplicated: Secondary | ICD-10-CM | POA: Insufficient documentation

## 2016-12-18 DIAGNOSIS — I5042 Chronic combined systolic (congestive) and diastolic (congestive) heart failure: Secondary | ICD-10-CM

## 2016-12-18 DIAGNOSIS — I340 Nonrheumatic mitral (valve) insufficiency: Secondary | ICD-10-CM

## 2016-12-18 DIAGNOSIS — I10 Essential (primary) hypertension: Secondary | ICD-10-CM

## 2016-12-18 DIAGNOSIS — I110 Hypertensive heart disease with heart failure: Secondary | ICD-10-CM | POA: Insufficient documentation

## 2016-12-18 DIAGNOSIS — E785 Hyperlipidemia, unspecified: Secondary | ICD-10-CM | POA: Insufficient documentation

## 2016-12-18 DIAGNOSIS — Z79899 Other long term (current) drug therapy: Secondary | ICD-10-CM | POA: Insufficient documentation

## 2016-12-18 MED ORDER — SACUBITRIL-VALSARTAN 97-103 MG PO TABS: 1.0000 | ORAL_TABLET | Freq: Two times a day (BID) | ORAL | 3 refills | Status: DC

## 2016-12-18 NOTE — Telephone Encounter (Signed)
Patient states he has been taking 49-51 mg tablets one tab twice daily at home (not the 24-26 mg tablets he had in the med bag during his visit today). Per Jettie Booze NP increase Entresto to 97-103 mg BID.  Patient aware and agreeable.  Sent to mail order.  Also refilled heart medications through heart failure fund.  Renee Pain, RN

## 2016-12-18 NOTE — Patient Instructions (Signed)
Take Lasix 40 mg (2 tabs) twice daily for two days ONLY, then reduce back to normal dose of 20 mg (1 tab) once daily.  Call CHF clinic to verify how you are taking Entresto and what strength the tablets are.  Follow up 1 month.  Take all medication as prescribed the day of your appointment. Bring all medications with you to your appointment.  Do the following things EVERYDAY: 1) Weigh yourself in the morning before breakfast. Write it down and keep it in a log. 2) Take your medicines as prescribed 3) Eat low salt foods-Limit salt (sodium) to 2000 mg per day.  4) Stay as active as you can everyday 5) Limit all fluids for the day to less than 2 liters

## 2016-12-18 NOTE — Progress Notes (Signed)
Advanced Heart Failure Clinic Note   Primary Cardiologist: Dr Harrington Challenger HF Cardiology: Dr Aundra Dubin  HPI:  [name] [name] is a 46 y.o. male with HTN, HLD, Systolic CHF, and polysubstance abuse (Cocaine, THC, and ETOH)  Presented to [facility] 08/20/16 with worsening dyspnea and CP.  He was diagnosed with systolic CHF with EF 79% on echo. Cath as below with normal coronaries and preserved cardiac output. Initial coox 50.5% so transiently placed on milrinone 0.125 mcg/kg/min. Pt tolerated wean with diuresis and adjustment of medications.   He presents today for HF follow up. He has been taking Entresto 24/26 mg BID instead of 49/51 mg as he gets it from [facility] and they are still sending him the lower strength. Says he feels SOB with walking. Endorses bendopnea. Weight at home 190 pounds. Drinking less than 2L a day, following a low salt diet. Says he feels SOB with walking into clinic, this has worsened in the past few weeks. Taking all medications. Drinking 1-2 beers a week, no cocaine.   Review of systems complete and found to be negative unless listed in HPI.    Past medical History 1. Chronic systolic CHF: Nonischemic cardiomyopathy, heavy ETOH use versus long-standing HTN. New diagnosis 08/2016 . HIV negative, SPEP negative.  - LHC (4/18) with no CAD. - Echo (4/18): LVEF 15%, Mod MR, Mod LAE, RV mild reduced, Mild RAE.  - RHC (4/18): mean RA 1, PA 22/11, mean PCWP 15, CI 2.7 (on milrinone 0.125) - NICM work up ongoing.  ? Etiology from heavy ETOH use of long standing HTN with LVH on EKG 2. HTN:  Long standing and poorly controlled. LVH on EKG 3. Hyperlipidemia 4. Polysubstance abuse: h/o cocaine and marijuana.  5. MR: Moderate by 4/18 echo, likely functional.  6. ETOH abuse: Has cut back.   Current Outpatient Prescriptions  Medication Sig Dispense Refill  . carvedilol (COREG) 6.25 MG tablet Take 1.5 tablets (9.375 mg total) by mouth 2 (two) times daily with a meal. 90 tablet 0  .  digoxin (LANOXIN) 0.125 MG tablet Take 1 tablet (0.125 mg total) by mouth daily. 30 tablet 2  . furosemide (LASIX) 20 MG tablet Take 1 tablet (20 mg total) by mouth daily. Take additional 20 mg tab for weight gain >3 lb in 1 day or >5 lb in 1 week 60 tablet 2  . sacubitril-valsartan (ENTRESTO) 24-26 MG Take 1 tablet by mouth 2 (two) times daily.    Marland Kitchen spironolactone (ALDACTONE) 25 MG tablet Take 1 tablet (25 mg total) by mouth daily. 30 tablet 2  . thiamine 100 MG tablet Take 1 tablet (100 mg total) by mouth daily. 30 tablet 0   No current facility-administered medications for this encounter.     No Known Allergies    Social History   Social History  . Marital status: Single    Spouse name: N/A  . Number of children: N/A  . Years of education: N/A   Occupational History  . Not on file.   Social History Main Topics  . Smoking status: Never Smoker  . Smokeless tobacco: Never Used  . Alcohol use Yes     Comment: (2) 24 oz beers every other day pt reports  . Drug use: Yes    Types: Marijuana     Comment: pt reports marijuana use occasionally  . Sexual activity: Yes    Birth control/ protection: None   Other Topics Concern  . Not on file   Social History Narrative  .  No narrative on file      Family History  Problem Relation Age of Onset  . Diabetes Other   . Hypertension Other     Vitals:   12/18/16 0831  BP: 122/86  Pulse: 91  SpO2: 98%  Weight: 187 lb (84.8 kg)   Wt Readings from Last 3 Encounters:  12/18/16 187 lb (84.8 kg)  11/16/16 191 lb 8 oz (86.9 kg)  11/14/16 195 lb (88.5 kg)    PHYSICAL EXAM: General: Well appearing male, NAD. SOB with walking into clinic.  HEENT: Normal.  Neck: supple. JVP 8 cm. Carotids 2+ bilat; no bruits. No thyromegaly or nodule noted. Cor: PMI nondisplaced.Regular rate and rhythm. No S3.  Lungs: Clear bilaterally. Normal effort.  Abdomen: Soft, non tender, non distended.  no HSM. No bruits or masses. +BS  Extremities: no  cyanosis, clubbing, rash. No peripheral edema.  Neuro: Alert and oriented x 3.  cranial nerves grossly intact. moves all 4 extremities w/o difficulty. Affect pleasant   ASSESSMENT & PLAN: 1. Chronic systolic CHF: Nonischemic cardiomyopathy.  Possibly due to ETOH abuse versus long-standing HTN.  Echo (4/18) LVEF 15%, Mod MR, Mod LAE, RV mild reduced, Mild RAE.  Cath with no CAD.   - NYHA III - Slightly volume overloaded on exam. Will increase Lasix to 40 mg daily for 2 days. He did a 2 minute walk, oxygen saturations remained 93-95%.  - Continue Coreg 9.375 mg BID.  - He is unclear if he is taking Entresto 24/26 mg BID or 49/51 mg BID. He brought the 24/26 mg Entresto bottle in with him today, but says that he has a higher strength at home that he is taking. I have asked him to call us when he gets home to clarify. Either way, he can go up to the next strength.   - Continue Spiro 25 mg daily.  - Continue digoxin, recent level ok.  - Cardiac MRI versus echo (depending on insurance coverage - waiting on Medicaid) in 10/18 to decide on need for ICD.  Our social worker is following up with him today regarding this.   2. HTN: - Well controlled on current regimen.   3. Polysubstance abuse: Tox panel + for [facility] and Cocaine on admission 08/2016. - Denies cocaine use.   4 ETOH abuse:  - has cut back to 1-2 beers a week.  - Encouraged complete cessation.   5. Moderate MR: Suspect functional.  - Will need repeat Echo in Oct.   Follow up in one month.   Arbutus Leas, NP 12/18/16   Greater than 50% of the (total minutes 30) visit spent in counseling/coordination of care regarding (medication adherence, diet and fluid restrictions, coordination of care).

## 2016-12-24 ENCOUNTER — Telehealth (HOSPITAL_COMMUNITY): Payer: Self-pay | Admitting: *Deleted

## 2016-12-24 MED ORDER — SPIRONOLACTONE 25 MG PO TABS: 25.0000 mg | ORAL_TABLET | Freq: Every day | ORAL | 2 refills | Status: DC

## 2016-12-24 MED ORDER — CARVEDILOL 6.25 MG PO TABS
9.3750 mg | ORAL_TABLET | Freq: Two times a day (BID) | ORAL | 3 refills | Status: DC
Start: 2016-12-24 — End: 2017-03-07

## 2016-12-24 MED ORDER — FUROSEMIDE 20 MG PO TABS: 20.0000 mg | ORAL_TABLET | Freq: Every day | ORAL | 2 refills | Status: DC

## 2016-12-24 MED ORDER — DIGOXIN 125 MCG PO TABS: 0.1250 mg | ORAL_TABLET | Freq: Every day | ORAL | 2 refills | Status: DC

## 2016-12-24 NOTE — Telephone Encounter (Signed)
Patient called needing refills.  He uses the [facility], form completed with refills and faxed to [place] today.

## 2016-12-25 MED FILL — SPIRONOLACTONE 25 MG TABLET: 25 | 34 days supply | Qty: 34 | Fill #0

## 2016-12-28 DIAGNOSIS — Z736 Limitation of activities due to disability: Secondary | ICD-10-CM

## 2016-12-31 ENCOUNTER — Other Ambulatory Visit (HOSPITAL_COMMUNITY): Payer: Self-pay | Admitting: Cardiology

## 2017-01-01 MED FILL — CARVEDILOL 6.25 MG TABLET: 6.25 | 30 days supply | Qty: 90 | Fill #0

## 2017-01-03 MED FILL — DIGOXIN 0.125 MG TABLET: 125 | 34 days supply | Qty: 34 | Fill #0

## 2017-01-10 ENCOUNTER — Telehealth (HOSPITAL_COMMUNITY): Payer: Self-pay | Admitting: Cardiology

## 2017-01-10 NOTE — Telephone Encounter (Signed)
Patient eft voicemail on triage line with concerns about medication he received in the mail. Reports the dose is higher than what he has been taking    LMOM

## 2017-01-16 NOTE — Telephone Encounter (Signed)
Unable to leave message, voicemail full °

## 2017-01-18 ENCOUNTER — Inpatient Hospital Stay (HOSPITAL_COMMUNITY): Admission: RE | Admit: 2017-01-18 | Payer: Self-pay | Source: Ambulatory Visit

## 2017-01-23 ENCOUNTER — Ambulatory Visit (HOSPITAL_COMMUNITY)
Admission: RE | Admit: 2017-01-23 | Discharge: 2017-01-23 | Disposition: A | Discharge status: Home / Self Care | Payer: Self-pay | Source: Ambulatory Visit | Attending: Internal Medicine | Admitting: Internal Medicine

## 2017-01-23 ENCOUNTER — Encounter (HOSPITAL_COMMUNITY): Payer: Self-pay

## 2017-01-23 VITALS — BP 128/96 | HR 83 | Wt 184.2 lb

## 2017-01-23 DIAGNOSIS — I10 Essential (primary) hypertension: Secondary | ICD-10-CM

## 2017-01-23 DIAGNOSIS — I110 Hypertensive heart disease with heart failure: Secondary | ICD-10-CM | POA: Insufficient documentation

## 2017-01-23 DIAGNOSIS — E785 Hyperlipidemia, unspecified: Secondary | ICD-10-CM | POA: Insufficient documentation

## 2017-01-23 DIAGNOSIS — I340 Nonrheumatic mitral (valve) insufficiency: Secondary | ICD-10-CM

## 2017-01-23 DIAGNOSIS — F1410 Cocaine abuse, uncomplicated: Secondary | ICD-10-CM

## 2017-01-23 DIAGNOSIS — R0600 Dyspnea, unspecified: Secondary | ICD-10-CM | POA: Insufficient documentation

## 2017-01-23 DIAGNOSIS — I428 Other cardiomyopathies: Secondary | ICD-10-CM

## 2017-01-23 DIAGNOSIS — F1010 Alcohol abuse, uncomplicated: Secondary | ICD-10-CM

## 2017-01-23 DIAGNOSIS — F1210 Cannabis abuse, uncomplicated: Secondary | ICD-10-CM | POA: Insufficient documentation

## 2017-01-23 DIAGNOSIS — I5022 Chronic systolic (congestive) heart failure: Secondary | ICD-10-CM

## 2017-01-23 LAB — BASIC METABOLIC PANEL
ANION GAP: 12 (ref 5–15)
BUN: 5 mg/dL — ABNORMAL LOW (ref 6–20)
CALCIUM: 9 mg/dL (ref 8.9–10.3)
CO2: 27 mmol/L (ref 22–32)
Chloride: 96 mmol/L — ABNORMAL LOW (ref 101–111)
Creatinine, Ser: 0.88 mg/dL (ref 0.61–1.24)
GFR calc non Af Amer: >60 mL/min (ref >60)
Glucose, Bld: 93 mg/dL (ref 65–99)
POTASSIUM: 3.9 mmol/L (ref 3.5–5.1)
Sodium: 135 mmol/L (ref 135–145)

## 2017-01-23 LAB — BRAIN NATRIURETIC PEPTIDE: B Natriuretic Peptide: 20.3 pg/mL (ref 0.0–100.0)

## 2017-01-23 MED ORDER — FUROSEMIDE 20 MG PO TABS: 20.0000 mg | ORAL_TABLET | Freq: Every day | ORAL | 2 refills | Status: DC | PRN

## 2017-01-23 NOTE — Patient Instructions (Addendum)
Routine lab work today. Will notify you of abnormal results, otherwise no news is good news!  Take Lasix 20 mg tablet once daily AS NEEDED for weight gain 3 lbs or more overnight or 5 lbs or more in 1 week.  Follow up 6-8 weeks with echocardiogram and appointment with Dr. Aundra Dubin. Take all medication as prescribed the day of your appointment. Bring all medications with you to your appointment.  Do the following things EVERYDAY: 1) Weigh yourself in the morning before breakfast. Write it down and keep it in a log. 2) Take your medicines as prescribed 3) Eat low salt foods-Limit salt (sodium) to 2000 mg per day.  4) Stay as active as you can everyday 5) Limit all fluids for the day to less than 2 liters

## 2017-01-23 NOTE — Progress Notes (Signed)
Advanced Heart Failure Medication Review by a Pharmacist  Does the patient  feel that his/her medications are working for him/her?  yes  Has the patient been experiencing any side effects to the medications prescribed?  no  Does the patient measure his/her own blood pressure or blood glucose at home?  no   Does the patient have any problems obtaining medications due to transportation or finances?   Yes - still no rx insurance, [employer] PAF for [employer] of regimen: good Understanding of indications: good Potential of compliance: good Patient understands to avoid NSAIDs. Patient understands to avoid decongestants.  Issues to address at subsequent visits: None   Pharmacist comments:  [name] [name] is a pleasant 46 yo M presenting with his medication bottles. He reports good compliance with his regimen and his only concern was with his increased fatigue and SOB. He did not have any medication-related questions or concerns for me at this time.   Ruta Hinds. Velva Harman, PharmD, BCPS, CPP Clinical Pharmacist Pager: [phone] Phone: [phone] 01/23/2017 9:51 AM      Time with patient: 10 minutes Preparation and documentation time: 2 minutes Total time: 12 minutes

## 2017-01-23 NOTE — Progress Notes (Signed)
Advanced Heart Failure Clinic Note   Primary Cardiologist: Dr Harrington Challenger HF Cardiology: Dr Aundra Dubin  HPI:  [name] [name] is a 46 y.o. male with HTN, HLD, Systolic CHF, and polysubstance abuse (Cocaine, THC, and ETOH)  Presented to [facility] 08/20/16 with worsening dyspnea and CP.  He was diagnosed with systolic CHF with EF 37% on echo. Cath as below with normal coronaries and preserved cardiac output. Initial coox 50.5% so transiently placed on milrinone 0.125 mcg/kg/min. Pt tolerated wean with diuresis and adjustment of medications.   He presents today for regular follow up. Last visit Entresto increased. Since increased he feels like he has had more SOB and fatigue. Weight down about 5 lbs at home, from baseline of around 190.  He has been having mild lightheadedness with standing, especially worse after bending. Check weights but not everyday.  Drinking less than 2 L daily and limiting salt.  Taking all medications as directed. Can't walk a block without SOB. Denies ETOH or drug use.   Review of systems complete and found to be negative unless listed in HPI.    Past medical History 1. Chronic systolic CHF: Nonischemic cardiomyopathy, heavy ETOH use versus long-standing HTN. New diagnosis 08/2016 . HIV negative, SPEP negative.  - LHC (4/18) with no CAD. - Echo (4/18): LVEF 15%, Mod MR, Mod LAE, RV mild reduced, Mild RAE.  - RHC (4/18): mean RA 1, PA 22/11, mean PCWP 15, CI 2.7 (on milrinone 0.125) - NICM work up ongoing.  ? Etiology from heavy ETOH use of long standing HTN with LVH on EKG 2. HTN:  Long standing and poorly controlled. LVH on EKG 3. Hyperlipidemia 4. Polysubstance abuse: h/o cocaine and marijuana.  5. MR: Moderate by 4/18 echo, likely functional.  6. ETOH abuse: Has cut back.   Current Outpatient Prescriptions  Medication Sig Dispense Refill  . carvedilol (COREG) 6.25 MG tablet Take 1.5 tablets (9.375 mg total) by mouth 2 (two) times daily with a meal. 90 tablet 3  . digoxin  (LANOXIN) 0.125 MG tablet Take 1 tablet (0.125 mg total) by mouth daily. 30 tablet 2  . furosemide (LASIX) 20 MG tablet Take 1 tablet (20 mg total) by mouth daily. Take additional 20 mg tab for weight gain >3 lb in 1 day or >5 lb in 1 week 60 tablet 2  . sacubitril-valsartan (ENTRESTO) 97-103 MG Take 1 tablet by mouth 2 (two) times daily. 180 tablet 3  . spironolactone (ALDACTONE) 25 MG tablet Take 1 tablet (25 mg total) by mouth daily. 30 tablet 2  . thiamine 100 MG tablet Take 1 tablet (100 mg total) by mouth daily. 30 tablet 0   No current facility-administered medications for this encounter.     No Known Allergies    Social History   Social History  . Marital status: Single    Spouse name: N/A  . Number of children: N/A  . Years of education: N/A   Occupational History  . Not on file.   Social History Main Topics  . Smoking status: Never Smoker  . Smokeless tobacco: Never Used  . Alcohol use Yes     Comment: (2) 24 oz beers every other day pt reports  . Drug use: Yes    Types: Marijuana     Comment: pt reports marijuana use occasionally  . Sexual activity: Yes    Birth control/ protection: None   Other Topics Concern  . Not on file   Social History Narrative  . No narrative on  file      Family History  Problem Relation Age of Onset  . Diabetes Other   . Hypertension Other     Vitals:   01/23/17 0902  BP: (!) 128/96  Pulse: 83  SpO2: 99%  Weight: 184 lb 3.2 oz (83.6 kg)   Wt Readings from Last 3 Encounters:  01/23/17 184 lb 3.2 oz (83.6 kg)  12/18/16 187 lb (84.8 kg)  11/16/16 191 lb 8 oz (86.9 kg)    PHYSICAL EXAM: General: Well appearing male, NAD. SOB with walking into clinic.  HEENT: Normal.  Neck: supple. JVP 8 cm. Carotids 2+ bilat; no bruits. No thyromegaly or nodule noted. Cor: PMI nondisplaced.Regular rate and rhythm. No S3.  Lungs: Clear bilaterally. Normal effort.  Abdomen: Soft, non tender, non distended.  no HSM. No bruits or masses.  +BS  Extremities: no cyanosis, clubbing, rash. No peripheral edema.  Neuro: Alert and oriented x 3.  cranial nerves grossly intact. moves all 4 extremities w/o difficulty. Affect pleasant   ASSESSMENT & PLAN: 1. Chronic systolic CHF: Nonischemic cardiomyopathy.  Possibly due to ETOH abuse versus long-standing HTN.  Echo (4/18) LVEF 15%, Mod MR, Mod LAE, RV mild reduced, Mild RAE.  Cath with no CAD.   - NYHA III - Volume status stable to dry. Stop scheduled lasix.  - Change lasix to 20 mg AS NEEDED.  - Continue Coreg 9.375 mg BID.  - Continue Entresto 97/103 mg BID.  - Continue Spiro 25 mg daily.  - Continue digoxin 0.125 mg daily.   - Will plan tentatively on repeat Echo next month. If any question remains and insurance obtained, can get cMRI at that point.   2. HTN: - Meds as above.     3. Polysubstance abuse: Tox panel + for [facility] and Cocaine on admission 08/2016. - Denies cocaine use.   4 ETOH abuse:  - has cut back to 1-2 beers a week.  - Encouraged complete cessation. No change.   5. Moderate MR: Suspect functional.  - Repeat echo next month.   Meds as above. Labs today. RTC 6 weeks with Echo.   Shirley Friar, PA-C 01/23/17   Greater than 50% of the 25 minute visit was spent in counseling/coordination of care regarding disease state education, sliding scale diuretics, medication reconciliation, and salt/fluid restriction.

## 2017-01-23 NOTE — Telephone Encounter (Signed)
Patient seen in office 01/23/17, med questions addressed

## 2017-01-25 ENCOUNTER — Encounter (HOSPITAL_COMMUNITY): Payer: Self-pay

## 2017-01-25 NOTE — Progress Notes (Signed)
[place] form completed and signed by Dr. Aundra Dubin to keep patient out of work until patient returns on 03/07/17. Dr. Aundra Dubin will address ability to return to work at that time.  Renee Pain, RN

## 2017-02-19 MED FILL — SPIRONOLACTONE 25 MG TABLET: 25 | 34 days supply | Qty: 34 | Fill #1

## 2017-02-19 MED FILL — CARVEDILOL 6.25 MG TABLET: 6.25 | 30 days supply | Qty: 90 | Fill #1

## 2017-02-19 MED FILL — DIGOXIN 0.125 MG TABLET: 125 | 34 days supply | Qty: 34 | Fill #1

## 2017-03-07 ENCOUNTER — Ambulatory Visit (HOSPITAL_COMMUNITY)
Admission: RE | Admit: 2017-03-07 | Discharge: 2017-03-07 | Disposition: A | Discharge status: Home / Self Care | Payer: Self-pay | Source: Ambulatory Visit | Attending: Internal Medicine | Admitting: Internal Medicine

## 2017-03-07 ENCOUNTER — Encounter (HOSPITAL_COMMUNITY): Payer: Self-pay | Admitting: Cardiology

## 2017-03-07 ENCOUNTER — Ambulatory Visit (HOSPITAL_BASED_OUTPATIENT_CLINIC_OR_DEPARTMENT_OTHER)
Admission: RE | Admit: 2017-03-07 | Discharge: 2017-03-07 | Disposition: A | Discharge status: Home / Self Care | Payer: Self-pay | Source: Ambulatory Visit | Attending: Cardiology | Admitting: Cardiology

## 2017-03-07 VITALS — BP 142/98 | HR 69 | Wt 192.0 lb

## 2017-03-07 DIAGNOSIS — F1410 Cocaine abuse, uncomplicated: Secondary | ICD-10-CM | POA: Insufficient documentation

## 2017-03-07 DIAGNOSIS — I10 Essential (primary) hypertension: Secondary | ICD-10-CM

## 2017-03-07 DIAGNOSIS — I429 Cardiomyopathy, unspecified: Secondary | ICD-10-CM | POA: Insufficient documentation

## 2017-03-07 DIAGNOSIS — R400 Somnolence: Secondary | ICD-10-CM

## 2017-03-07 DIAGNOSIS — I5022 Chronic systolic (congestive) heart failure: Secondary | ICD-10-CM

## 2017-03-07 DIAGNOSIS — I110 Hypertensive heart disease with heart failure: Secondary | ICD-10-CM | POA: Insufficient documentation

## 2017-03-07 DIAGNOSIS — I428 Other cardiomyopathies: Secondary | ICD-10-CM

## 2017-03-07 DIAGNOSIS — R0600 Dyspnea, unspecified: Secondary | ICD-10-CM

## 2017-03-07 DIAGNOSIS — F1210 Cannabis abuse, uncomplicated: Secondary | ICD-10-CM | POA: Insufficient documentation

## 2017-03-07 DIAGNOSIS — E785 Hyperlipidemia, unspecified: Secondary | ICD-10-CM | POA: Insufficient documentation

## 2017-03-07 LAB — BASIC METABOLIC PANEL
Anion gap: 9 (ref 5–15)
BUN: 5 mg/dL — AB (ref 6–20)
CHLORIDE: 98 mmol/L — AB (ref 101–111)
CO2: 27 mmol/L (ref 22–32)
CREATININE: 1.09 mg/dL (ref 0.61–1.24)
Calcium: 9 mg/dL (ref 8.9–10.3)
GFR calc Af Amer: >60 mL/min (ref >60)
GFR calc non Af Amer: >60 mL/min (ref >60)
Glucose, Bld: 86 mg/dL (ref 65–99)
Potassium: 4.3 mmol/L (ref 3.5–5.1)
Sodium: 134 mmol/L — ABNORMAL LOW (ref 135–145)

## 2017-03-07 LAB — BRAIN NATRIURETIC PEPTIDE: B Natriuretic Peptide: 41.9 pg/mL (ref 0.0–100.0)

## 2017-03-07 MED ORDER — CARVEDILOL 12.5 MG PO TABS: 12.5000 mg | ORAL_TABLET | Freq: Two times a day (BID) | ORAL | 6 refills | Status: DC

## 2017-03-07 NOTE — Progress Notes (Addendum)
  Echocardiogram 2D Echocardiogram has been performed.  Technically difficult views due to lung interference.  Armanii Urbanik L Androw 03/07/2017, 9:51 AM

## 2017-03-07 NOTE — Patient Instructions (Signed)
Stop Digoxin  Increase Carvedilol to 12.5 mg Twice daily   Labs today  Your physician has recommended that you have a pulmonary function test. Pulmonary Function Tests are a group of tests that measure how well air moves in and out of your lungs.  Your physician has recommended that you have a sleep study. This test records several body functions during sleep, including: brain activity, eye movement, oxygen and carbon dioxide blood levels, heart rate and rhythm, breathing rate and rhythm, the flow of air through your mouth and nose, snoring, body muscle movements, and chest and belly movement.  We will contact you in 4 months to schedule your next appointment.

## 2017-03-07 NOTE — Progress Notes (Signed)
Advanced Heart Failure Clinic Note   HF Cardiology: Dr Aundra Dubin  HPI:  [name] [name] is a 46 y.o. male with HTN, HLD, Systolic CHF, and history of polysubstance abuse (Cocaine, THC, and ETOH)  Presented to [facility] 08/20/16 with worsening dyspnea and CP.  He was diagnosed with systolic CHF with EF 95% on echo. Cath as below with normal coronaries and preserved cardiac output. Initial coox 50.5% so transiently placed on milrinone 0.125 mcg/kg/min. Pt tolerated wean with diuresis and adjustment of medications.   Echo from today was reviewed: EF up to 50% with mild diffuse hypokinesis.  BP remains high at times still.  Today, it is mildly elevated. He remains short of breath after walking 100 yards or walking up a flight of stairs. No audible wheezing.  He snores and is fatigued during the day. No orthopnea/PND. No lightheadedness.  No chest pain.    Labs (7/18): digoxin 0.7 Labs (9/18): K 3.9, creatinine 0.88, BNP 20  Review of systems complete and found to be negative unless listed in HPI.    Past medical History 1. Chronic systolic CHF: Nonischemic cardiomyopathy, heavy ETOH use versus long-standing HTN. New diagnosis 08/2016 . HIV negative, SPEP negative.  - LHC (4/18) with no CAD. - Echo (4/18): LVEF 15%, Mod MR, Mod LAE, RV mild reduced, Mild RAE.  - RHC (4/18): mean RA 1, PA 22/11, mean PCWP 15, CI 2.7 (on milrinone 0.125) - NICM work up ongoing.  ? Etiology from heavy ETOH use of long standing HTN with LVH on EKG - Echo (10/18): EF 50%, diffuse hypokinesis, normal RV size and systolic function, small IVC. 2. HTN:  Long standing and poorly controlled. LVH on EKG 3. Hyperlipidemia 4. Polysubstance abuse: h/o cocaine and marijuana.  5. MR: Moderate by 4/18 echo, likely functional.  6. ETOH abuse: Has cut back.   Current Outpatient Prescriptions  Medication Sig Dispense Refill  . carvedilol (COREG) 12.5 MG tablet Take 1 tablet (12.5 mg total) by mouth 2 (two) times daily with a meal.  60 tablet 6  . sacubitril-valsartan (ENTRESTO) 97-103 MG Take 1 tablet by mouth 2 (two) times daily. 180 tablet 3  . spironolactone (ALDACTONE) 25 MG tablet Take 1 tablet (25 mg total) by mouth daily. 30 tablet 2  . thiamine 100 MG tablet Take 1 tablet (100 mg total) by mouth daily. 30 tablet 0  . furosemide (LASIX) 20 MG tablet Take 1 tablet (20 mg total) by mouth daily as needed. For weight gain >3 lb in 1 day or >5 lb in 1 week. (Patient not taking: Reported on 03/07/2017) 60 tablet 2   No current facility-administered medications for this encounter.     No Known Allergies    Social History   Social History  . Marital status: Single    Spouse name: N/A  . Number of children: N/A  . Years of education: N/A   Occupational History  . Not on file.   Social History Main Topics  . Smoking status: Never Smoker  . Smokeless tobacco: Never Used  . Alcohol use Yes     Comment: (2) 24 oz beers every other day pt reports  . Drug use: Yes    Types: Marijuana     Comment: pt reports marijuana use occasionally  . Sexual activity: Yes    Birth control/ protection: None   Other Topics Concern  . Not on file   Social History Narrative  . No narrative on file      Family  History  Problem Relation Age of Onset  . Diabetes Other   . Hypertension Other     Vitals:   03/07/17 0954  BP: (!) 142/98  Pulse: 69  SpO2: 95%  Weight: 192 lb (87.1 kg)   Wt Readings from Last 3 Encounters:  03/07/17 192 lb (87.1 kg)  01/23/17 184 lb 3.2 oz (83.6 kg)  12/18/16 187 lb (84.8 kg)    PHYSICAL EXAM: General: NAD Neck: No JVD, no thyromegaly or thyroid nodule.  Lungs: Clear to auscultation bilaterally with normal respiratory effort. CV: Nondisplaced PMI.  Heart regular S1/S2, no S3/S4, no murmur.  No peripheral edema.  No carotid bruit.  Normal pedal pulses.  Abdomen: Soft, nontender, no hepatosplenomegaly, no distention.  Skin: Intact without lesions or rashes.  Neurologic: Alert and  oriented x 3.  Psych: Normal affect. Extremities: No clubbing or cyanosis.  HEENT: Normal.   ASSESSMENT & PLAN: 1. Chronic systolic CHF: Nonischemic cardiomyopathy.  Possibly due to ETOH abuse versus long-standing HTN.  Echo (4/18) LVEF 15%, Mod MR, Mod LAE, RV mild reduced, Mild RAE.  Cath with no CAD.  Echo today showed EF up to 50% with cessation of ETOH and treatment of HTN.  NYHA class III symptoms though he does not appear volume overloaded on exam.   - He can continue prn Lasix use.  - With improvement in EF, can stop digoxin.    - Increase Coreg to 12.5 mg bid to help with BP control.  - Continue Entresto 97/103 mg BID.  - Continue Spiro 25 mg daily. 2. HTN: Meds as above.    3. Polysubstance abuse: Tox panel + for [facility] and Cocaine on admission 08/2016. - Denies cocaine use.  4 ETOH abuse: Has cut back to 1-2 beers a week.  - Encouraged complete cessation. No change.  5. Dyspnea: This does not appear to be CHF-related (normal volume status).  - I will arrange for PFTs to assess for lung abnormalities.  6. Suspected OSA: I will also arrange for sleep study given daytime sleepiness and snoring.    Followup in 4 months.    Loralie Champagne, MD 03/07/17

## 2017-03-14 ENCOUNTER — Inpatient Hospital Stay (HOSPITAL_COMMUNITY): Admission: RE | Admit: 2017-03-14 | Payer: Self-pay | Source: Ambulatory Visit

## 2017-04-03 ENCOUNTER — Encounter (HOSPITAL_COMMUNITY): Payer: Self-pay | Admitting: *Deleted

## 2017-04-03 MED FILL — SPIRONOLACTONE 25 MG TABLET: 25 | 34 days supply | Qty: 34 | Fill #2

## 2017-04-03 MED FILL — CARVEDILOL 6.25 MG TABLET: 6.25 | 30 days supply | Qty: 90 | Fill #2

## 2017-04-03 NOTE — Progress Notes (Signed)
Received medical records request from [facility] on 04/02/2017. CASE # [id number]  Requested records faxed today to [phone].  Original request will be scanned to patient's electronic medical record.

## 2017-04-23 ENCOUNTER — Ambulatory Visit (HOSPITAL_BASED_OUTPATIENT_CLINIC_OR_DEPARTMENT_OTHER): Payer: Self-pay | Attending: Cardiology | Admitting: Cardiology

## 2017-04-23 DIAGNOSIS — G4710 Hypersomnia, unspecified: Secondary | ICD-10-CM

## 2017-04-23 DIAGNOSIS — R400 Somnolence: Secondary | ICD-10-CM | POA: Insufficient documentation

## 2017-05-03 DIAGNOSIS — Z736 Limitation of activities due to disability: Secondary | ICD-10-CM

## 2017-05-08 NOTE — Procedures (Signed)
   NAME: [name] [name] DATE OF BIRTH:  1970-06-22 MEDICAL RECORD NUMBER [id number]  LOCATION: [facility]  PHYSICIAN: Filippo Puls  DATE OF STUDY: 04/23/2017  CLINICAL INFORMATION Sleep Study Type: NPSG  Indication for sleep study: Congestive Heart Failure, Excessive Daytime Sleepiness  Epworth Sleepiness Score: 5  SLEEP STUDY TECHNIQUE As per the AASM Manual for the Scoring of Sleep and Associated Events v2.3 (April 2016) with a hypopnea requiring 4% desaturations.  The channels recorded and monitored were frontal, central and occipital EEG, electrooculogram (EOG), submentalis EMG (chin), nasal and oral airflow, thoracic and abdominal wall motion, anterior tibialis EMG, snore microphone, electrocardiogram, and pulse oximetry.  MEDICATIONS Medications self-administered by patient taken the night of the study : N/A  SLEEP ARCHITECTURE The study was initiated at 10:50:22 PM and ended at 4:59:38 AM.  Sleep onset time was 17.1 minutes and the sleep efficiency was 63.4%. The total sleep time was 234.0 minutes.  Stage REM latency was 28.5 minutes.  The patient spent 20.51% of the night in stage N1 sleep, 57.48% in stage N2 sleep, 0.00% in stage N3 and 22.01% in REM.  Alpha intrusion was absent.  Supine sleep was 35.90%.  RESPIRATORY PARAMETERS  The overall apnea/hypopnea index (AHI) was 4.6 per hour. There were 2 total apneas, including 0 obstructive, 2 central and 0 mixed apneas. There were 16 hypopneas and 10 RERAs.  The AHI during Stage REM sleep was 11.7 per hour.  AHI while supine was 6.4 per hour.  The mean oxygen saturation was 93.37%. The minimum SpO2 during sleep was 84.00%.  moderate snoring was noted during this study.  CARDIAC DATA The 2 lead EKG demonstrated sinus rhythm. The mean heart rate was 82.23 beats per minute. Other EKG findings include: None.  LEG MOVEMENT DATA The total PLMS were 105 with a resulting PLMS index of 26.92.  Associated arousal with leg movement index was 3.1 . IMPRESSIONS - No significant obstructive sleep apnea occurred during this study (AHI = 4.6/h). - No significant central sleep apnea occurred during this study (CAI = 0.5/h). - Mild oxygen desaturation was noted during this study (Min O2 = 84.00%). - The patient snored with moderate snoring volume. - No cardiac abnormalities were noted during this study. - Moderate periodic limb movements of sleep occurred during the study. No significant associated arousals  DIAGNOSIS - Normal study  RECOMMENDATIONS - Avoid alcohol, sedatives and other CNS depressants that may worsen sleep apnea and disrupt normal sleep architecture. - Sleep hygiene should be reviewed to assess factors that may improve sleep quality. - Weight management and regular exercise should be initiated or continued if appropriate.  [place], American Board of Sleep Medicine  ELECTRONICALLY SIGNED ON:  05/08/2017, 3:49 PM [place] PH: [phone]   FX: [phone] [place]

## 2017-05-10 ENCOUNTER — Telehealth: Payer: Self-pay | Admitting: *Deleted

## 2017-05-10 NOTE — Telephone Encounter (Signed)
-----   Message from Sueanne Margarita, MD sent at 05/08/2017  3:51 PM EST ----- Please let patient know that sleep study showed no significant sleep apnea.

## 2017-05-10 NOTE — Telephone Encounter (Signed)
Informed patient of sleep study results and patient understanding was verbalized. Patient understands his sleep study showed no sleep apnea. Patient understands he has no further appointments scheduled with our office. Patient was grateful for the call and thanked me.

## 2017-05-29 MED FILL — SPIRONOLACTONE 25 MG TABLET: 25 | 30 days supply | Qty: 30 | Fill #1

## 2017-05-29 MED FILL — CARVEDILOL 6.25 MG TABLET: 6.25 | 30 days supply | Qty: 90 | Fill #3

## 2017-06-14 ENCOUNTER — Encounter (HOSPITAL_COMMUNITY): Payer: Self-pay | Admitting: *Deleted

## 2017-06-14 NOTE — Progress Notes (Signed)
Received medical records request from [facility] on 06/03/2017.   CASE # [phone]  Requested records for 03/2017-present.  No records for time period found.  Sent fax making them aware today to [phone].  Original request will be scanned to patient's electronic medical record.

## 2017-06-26 MED FILL — SPIRONOLACTONE 25 MG TABLET: 25 | 30 days supply | Qty: 30 | Fill #2

## 2017-07-26 ENCOUNTER — Encounter (HOSPITAL_COMMUNITY): Payer: Self-pay | Admitting: *Deleted

## 2017-07-26 NOTE — Progress Notes (Signed)
Received medical records request from [facility] on 07/26/2017. CASE # [phone]  Requested records faxed today to [phone].  Original request will be scanned to patient's electronic medical record.

## 2017-08-05 ENCOUNTER — Other Ambulatory Visit (HOSPITAL_COMMUNITY): Payer: Self-pay | Admitting: Internal Medicine

## 2017-08-05 ENCOUNTER — Other Ambulatory Visit (HOSPITAL_COMMUNITY): Payer: Self-pay | Admitting: Cardiology

## 2017-08-07 MED FILL — SPIRONOLACTONE 25 MG TABLET: 25 | 30 days supply | Qty: 30 | Fill #0

## 2017-08-07 MED FILL — CARVEDILOL 6.25 MG TABLET: 6.25 | 30 days supply | Qty: 120 | Fill #0

## 2017-08-12 ENCOUNTER — Encounter (HOSPITAL_COMMUNITY): Payer: Self-pay | Admitting: *Deleted

## 2017-08-12 NOTE — Progress Notes (Signed)
Received medical records request from [facility], CASE # [phone]  Requested records faxed today to [phone].  Original request will be scanned to patient's electronic medical record.

## 2017-08-16 DIAGNOSIS — Z736 Limitation of activities due to disability: Secondary | ICD-10-CM

## 2017-09-25 MED FILL — SPIRONOLACTONE 25 MG TABLET: 25 | 30 days supply | Qty: 30 | Fill #1

## 2017-10-09 ENCOUNTER — Encounter (HOSPITAL_COMMUNITY): Payer: Self-pay

## 2017-10-09 NOTE — Progress Notes (Signed)
[employer] enrollment submitted via fax to provided # [phone].  Renee Pain, RN

## 2017-10-15 NOTE — Progress Notes (Signed)
[employer] Patient [place] through 10/11/2018

## 2017-10-16 NOTE — Progress Notes (Signed)
Advanced Heart Failure Clinic Note   HF Cardiology: Dr Aundra Dubin  HPI:  [name] [name] is a 47 y.o. male with HTN, HLD, Systolic CHF, and history of polysubstance abuse (Cocaine, THC, and ETOH)  Presented to [facility] 08/20/16 with worsening dyspnea and CP.  He was diagnosed with systolic CHF with EF 84% on echo. Cath as below with normal coronaries and preserved cardiac output. Initial coox 50.5% so transiently placed on milrinone 0.125 mcg/kg/min. Pt tolerated wean with diuresis and adjustment of medications.   Echo 02/2017: EF up to 50% with mild diffuse hypokinesis.  He presents today for regular follow up. Last visit EF had normalized. Since last visit, he cut his coreg back to 6.25 mg BID due to fatigue and nausea. Feeling somewhat better. He remains SOB after about 100 yards. He is very fatigued doing yard work. Can't do anything continuously for more than 15 minutes. He is taking his other meds all as directed. Denies orthopnea or PND. Only lightheaded when he has been outside working in the yard. Denies CP.   Labs (7/18): digoxin 0.7 Labs (9/18): K 3.9, creatinine 0.88, BNP 20  Review of systems complete and found to be negative unless listed in HPI.    Past medical History 1. Chronic systolic CHF: Nonischemic cardiomyopathy, heavy ETOH use versus long-standing HTN. New diagnosis 08/2016 . HIV negative, SPEP negative.  - LHC (4/18) with no CAD. - Echo (4/18): LVEF 15%, Mod MR, Mod LAE, RV mild reduced, Mild RAE.  - RHC (4/18): mean RA 1, PA 22/11, mean PCWP 15, CI 2.7 (on milrinone 0.125) - NICM work up ongoing.  ? Etiology from heavy ETOH use of long standing HTN with LVH on EKG - Echo (10/18): EF 50%, diffuse hypokinesis, normal RV size and systolic function, small IVC. 2. HTN:  Long standing and poorly controlled. LVH on EKG 3. Hyperlipidemia 4. Polysubstance abuse: h/o cocaine and marijuana.  5. MR: Moderate by 4/18 echo, likely functional.  6. ETOH abuse: Has cut back.    Current Outpatient Medications  Medication Sig Dispense Refill  . carvedilol (COREG) 6.25 MG tablet Take 6.25 mg by mouth 2 (two) times daily with a meal.    . sacubitril-valsartan (ENTRESTO) 97-103 MG Take 1 tablet by mouth 2 (two) times daily. 180 tablet 3  . spironolactone (ALDACTONE) 25 MG tablet TAKE 1 TABLET (25 MG TOTAL) BY MOUTH DAILY. 30 tablet 2  . thiamine 100 MG tablet Take 1 tablet (100 mg total) by mouth daily. 30 tablet 0  . furosemide (LASIX) 20 MG tablet Take 1 tablet (20 mg total) by mouth daily as needed. For weight gain >3 lb in 1 day or >5 lb in 1 week. (Patient not taking: Reported on 03/07/2017) 60 tablet 2  . spironolactone (ALDACTONE) 25 MG tablet Take 1 tablet (25 mg total) by mouth daily. 30 tablet 2   No current facility-administered medications for this encounter.     No Known Allergies    Social History   Socioeconomic History  . Marital status: Single    Spouse name: Not on file  . Number of children: Not on file  . Years of education: Not on file  . Highest education level: Not on file  Occupational History  . Not on file  Social Needs  . Financial resource strain: Not on file  . Food insecurity:    Worry: Not on file    Inability: Not on file  . Transportation needs:    Medical: Not on  file    Non-medical: Not on file  Tobacco Use  . Smoking status: Never Smoker  . Smokeless tobacco: Never Used  Substance and Sexual Activity  . Alcohol use: Yes    Comment: (2) 24 oz beers every other day pt reports  . Drug use: Yes    Types: Marijuana    Comment: pt reports marijuana use occasionally  . Sexual activity: Yes    Birth control/protection: None  Lifestyle  . Physical activity:    Days per week: Not on file    Minutes per session: Not on file  . Stress: Not on file  Relationships  . Social connections:    Talks on phone: Not on file    Gets together: Not on file    Attends religious service: Not on file    Active member of club or  organization: Not on file    Attends meetings of clubs or organizations: Not on file    Relationship status: Not on file  . Intimate partner violence:    Fear of current or ex partner: Not on file    Emotionally abused: Not on file    Physically abused: Not on file    Forced sexual activity: Not on file  Other Topics Concern  . Not on file  Social History Narrative  . Not on file      Family History  Problem Relation Age of Onset  . Diabetes Other   . Hypertension Other     Vitals:   10/17/17 0856  BP: 110/76  Pulse: 85  SpO2: 100%  Weight: 189 lb 6.4 oz (85.9 kg)   Wt Readings from Last 3 Encounters:  10/17/17 189 lb 6.4 oz (85.9 kg)  04/23/17 200 lb (90.7 kg)  03/07/17 192 lb (87.1 kg)    PHYSICAL EXAM:  General: Well appearing. No resp difficulty. HEENT: Normal Neck: Supple. JVP 5-6. Carotids 2+ bilat; no bruits. No thyromegaly or nodule noted. Cor: PMI nondisplaced. RRR, No M/G/R noted Lungs: CTAB, normal effort. Abdomen: Soft, non-tender, non-distended, no HSM. No bruits or masses. +BS  Extremities: No cyanosis, clubbing, or rash. R and LLE no edema.  Neuro: Alert & orientedx3, cranial nerves grossly intact. moves all 4 extremities w/o difficulty. Affect pleasant   ASSESSMENT & PLAN: 1. Chronic systolic CHF: Nonischemic cardiomyopathy.  Possibly due to ETOH abuse versus long-standing HTN.  Echo (4/18) LVEF 15%, Mod MR, Mod LAE, RV mild reduced, Mild RAE.  Cath with no CAD.   - Echo 02/2017 EF up to 50% with cessation of ETOH and treatment of HTN.   - NYHA III symptoms. Will refer to pulmonary rehab. - Volume status stable on exam.   - He can continue prn Lasix use.  - Off digoxin with improved EF.  - Continue Coreg 6.25 mg BID. Intolerant to higher doses with fatigue.  - Continue Entresto 97/103 mg BID.  - Continue Spiro 25 mg daily. - Reinforced fluid restriction to < 2 L daily, sodium restriction to less than 2000 mg daily, and the importance of daily  weights.   2. HTN: - Meds as above.  3. Polysubstance abuse: Tox panel + for [facility] and Cocaine on admission 08/2016. - Denies cocaine use.  4 ETOH abuse: - Encouraged complete cessation. No change.  5. Dyspnea: This does not appear to be CHF-related (normal volume status).  - PFTs ordered but not done.  6. Suspected OSA: - Sleep study negative 04/2017.   Remains fatigue and SOB with exertion, suspect  primarily deconditioning with normalization of EF. Will refer to pulmonary rehab. Labs today to rule out alternative cause of fatigue. RTC 3-4 months.  Shirley Friar, PA-C 10/16/17   Greater than 50% of the 25 minute visit was spent in counseling/coordination of care regarding disease state education, salt/fluid restriction, sliding scale diuretics, and medication compliance.

## 2017-10-17 ENCOUNTER — Ambulatory Visit (HOSPITAL_COMMUNITY)
Admission: RE | Admit: 2017-10-17 | Discharge: 2017-10-17 | Disposition: A | Discharge status: Home / Self Care | Payer: Self-pay | Source: Ambulatory Visit | Attending: Cardiology | Admitting: Cardiology

## 2017-10-17 VITALS — BP 110/76 | HR 85 | Wt 189.4 lb

## 2017-10-17 DIAGNOSIS — R0600 Dyspnea, unspecified: Secondary | ICD-10-CM | POA: Insufficient documentation

## 2017-10-17 DIAGNOSIS — F1010 Alcohol abuse, uncomplicated: Secondary | ICD-10-CM

## 2017-10-17 DIAGNOSIS — I340 Nonrheumatic mitral (valve) insufficiency: Secondary | ICD-10-CM

## 2017-10-17 DIAGNOSIS — I110 Hypertensive heart disease with heart failure: Secondary | ICD-10-CM | POA: Insufficient documentation

## 2017-10-17 DIAGNOSIS — R5383 Other fatigue: Secondary | ICD-10-CM | POA: Insufficient documentation

## 2017-10-17 DIAGNOSIS — R0602 Shortness of breath: Secondary | ICD-10-CM | POA: Insufficient documentation

## 2017-10-17 DIAGNOSIS — Z79899 Other long term (current) drug therapy: Secondary | ICD-10-CM | POA: Insufficient documentation

## 2017-10-17 DIAGNOSIS — E785 Hyperlipidemia, unspecified: Secondary | ICD-10-CM

## 2017-10-17 DIAGNOSIS — I5022 Chronic systolic (congestive) heart failure: Secondary | ICD-10-CM

## 2017-10-17 DIAGNOSIS — I10 Essential (primary) hypertension: Secondary | ICD-10-CM

## 2017-10-17 DIAGNOSIS — I429 Cardiomyopathy, unspecified: Secondary | ICD-10-CM | POA: Insufficient documentation

## 2017-10-17 DIAGNOSIS — F1410 Cocaine abuse, uncomplicated: Secondary | ICD-10-CM

## 2017-10-17 LAB — T4, FREE: FREE T4: 0.66 ng/dL — AB (ref 0.82–1.77)

## 2017-10-17 LAB — COMPREHENSIVE METABOLIC PANEL
ALK PHOS: 69 U/L (ref 38–126)
ALT: 52 U/L (ref 17–63)
ANION GAP: 9 (ref 5–15)
AST: 160 U/L — ABNORMAL HIGH (ref 15–41)
Albumin: 3.6 g/dL (ref 3.5–5.0)
BILIRUBIN TOTAL: 0.4 mg/dL (ref 0.3–1.2)
BUN: 7 mg/dL (ref 6–20)
CALCIUM: 9 mg/dL (ref 8.9–10.3)
CO2: 25 mmol/L (ref 22–32)
CREATININE: 0.93 mg/dL (ref 0.61–1.24)
Chloride: 107 mmol/L (ref 101–111)
GFR calc Af Amer: >60 mL/min (ref >60)
Glucose, Bld: 110 mg/dL — ABNORMAL HIGH (ref 65–99)
Potassium: 4.1 mmol/L (ref 3.5–5.1)
Sodium: 141 mmol/L (ref 135–145)
TOTAL PROTEIN: 7 g/dL (ref 6.5–8.1)

## 2017-10-17 LAB — CBC
HEMATOCRIT: 35.1 % — AB (ref 39.0–52.0)
Hemoglobin: 11.7 g/dL — ABNORMAL LOW (ref 13.0–17.0)
MCH: 35.1 pg — ABNORMAL HIGH (ref 26.0–34.0)
MCHC: 33.3 g/dL (ref 30.0–36.0)
MCV: 105.4 fL — ABNORMAL HIGH (ref 78.0–100.0)
PLATELETS: 280 10*3/uL (ref 150–400)
RBC: 3.33 MIL/uL — ABNORMAL LOW (ref 4.22–5.81)
RDW: 12.8 % (ref 11.5–15.5)
WBC: 3.4 10*3/uL — AB (ref 4.0–10.5)

## 2017-10-17 LAB — TSH: TSH: 1.209 u[IU]/mL (ref 0.350–4.500)

## 2017-10-17 MED ORDER — SPIRONOLACTONE 25 MG PO TABS: 25.0000 mg | ORAL_TABLET | Freq: Every day | ORAL | 2 refills | Status: DC

## 2017-10-17 MED ORDER — CARVEDILOL 6.25 MG PO TABS: 6.2500 mg | ORAL_TABLET | Freq: Two times a day (BID) | ORAL | 2 refills | Status: DC

## 2017-10-17 MED ORDER — FUROSEMIDE 20 MG PO TABS: 20.0000 mg | ORAL_TABLET | Freq: Every day | ORAL | 2 refills | Status: DC | PRN

## 2017-10-17 MED FILL — SPIRONOLACTONE 25 MG TABLET: 25 | 30 days supply | Qty: 30 | Fill #0

## 2017-10-17 MED FILL — CARVEDILOL 6.25 MG TABLET: 6.25 | 30 days supply | Qty: 60 | Fill #0

## 2017-10-17 MED FILL — FUROSEMIDE 20 MG TABS: 20 | 30 days supply | Qty: 30 | Fill #0

## 2017-10-17 NOTE — Patient Instructions (Signed)
Will refer you to pulmonary rehab at [facility]. They will call you to set up initial appointment.  Routine lab work today. Will notify you of abnormal results, otherwise no news is good news!  Refills have been sent to [facility] under the [facility]. Address: [address] [place], [place] [address]  Phone: [phone] Located next to [facility].  Follow up 4 months with Dr. Aundra Dubin.  _____________________________________________________________________ [name] [name] Code: 9629  Take all medication as prescribed the day of your appointment. Bring all medications with you to your appointment.  Do the following things EVERYDAY: 1) Weigh yourself in the morning before breakfast. Write it down and keep it in a log. 2) Take your medicines as prescribed 3) Eat low salt foods-Limit salt (sodium) to 2000 mg per day.  4) Stay as active as you can everyday 5) Limit all fluids for the day to less than 2 liters

## 2017-10-18 ENCOUNTER — Telehealth: Payer: Self-pay | Admitting: Licensed Clinical Social Worker

## 2017-10-18 NOTE — Telephone Encounter (Signed)
CSW requested to follow up with patient regarding medicaid application. Patient states he has a pending application with [facility]. He states he has received some paperwork but not sure what else is still needed. CSW encouraged patient to follow up with the medicaid office and return call if further assistance needed. Raquel Sarna, [place], [place]

## 2017-11-14 ENCOUNTER — Telehealth (HOSPITAL_COMMUNITY): Payer: Self-pay

## 2017-11-14 NOTE — Telephone Encounter (Signed)
Called patient to verify insurance, he stated he has Medicaid. Adv patient we do not accept Medicaid and but we do have a maintenance program, that he will have to pay out of pocket. Pt stated he is trying to get disability at the time and will not be able to participate.   Closed referral

## 2018-01-15 MED FILL — SPIRONOLACTONE 25 MG TABLET: 25 | 30 days supply | Qty: 30 | Fill #1

## 2018-01-15 MED FILL — CARVEDILOL 6.25 MG TABLET: 6.25 | 30 days supply | Qty: 60 | Fill #1

## 2018-01-15 MED FILL — FUROSEMIDE 20 MG TABS: 20 | 30 days supply | Qty: 30 | Fill #1

## 2018-01-21 ENCOUNTER — Ambulatory Visit (HOSPITAL_COMMUNITY)
Admission: RE | Admit: 2018-01-21 | Discharge: 2018-01-21 | Disposition: A | Discharge status: Home / Self Care | Payer: Self-pay | Source: Ambulatory Visit | Attending: Cardiology | Admitting: Cardiology

## 2018-01-21 VITALS — BP 128/90 | HR 97 | Wt 197.4 lb

## 2018-01-21 DIAGNOSIS — E785 Hyperlipidemia, unspecified: Secondary | ICD-10-CM | POA: Insufficient documentation

## 2018-01-21 DIAGNOSIS — I5022 Chronic systolic (congestive) heart failure: Secondary | ICD-10-CM | POA: Insufficient documentation

## 2018-01-21 DIAGNOSIS — I110 Hypertensive heart disease with heart failure: Secondary | ICD-10-CM | POA: Insufficient documentation

## 2018-01-21 DIAGNOSIS — F1010 Alcohol abuse, uncomplicated: Secondary | ICD-10-CM | POA: Insufficient documentation

## 2018-01-21 DIAGNOSIS — Z79899 Other long term (current) drug therapy: Secondary | ICD-10-CM | POA: Insufficient documentation

## 2018-01-21 DIAGNOSIS — I429 Cardiomyopathy, unspecified: Secondary | ICD-10-CM | POA: Insufficient documentation

## 2018-01-21 DIAGNOSIS — F1911 Other psychoactive substance abuse, in remission: Secondary | ICD-10-CM | POA: Insufficient documentation

## 2018-01-21 DIAGNOSIS — I10 Essential (primary) hypertension: Secondary | ICD-10-CM

## 2018-01-21 LAB — BASIC METABOLIC PANEL
ANION GAP: 11 (ref 5–15)
CHLORIDE: 98 mmol/L (ref 98–111)
CO2: 27 mmol/L (ref 22–32)
Calcium: 9.5 mg/dL (ref 8.9–10.3)
Creatinine, Ser: 0.81 mg/dL (ref 0.61–1.24)
GFR calc Af Amer: >60 mL/min (ref >60)
GFR calc non Af Amer: >60 mL/min (ref >60)
Glucose, Bld: 105 mg/dL — ABNORMAL HIGH (ref 70–99)
POTASSIUM: 4.5 mmol/L (ref 3.5–5.1)
SODIUM: 136 mmol/L (ref 135–145)

## 2018-01-21 LAB — BRAIN NATRIURETIC PEPTIDE: B NATRIURETIC PEPTIDE 5: 12.5 pg/mL (ref 0.0–100.0)

## 2018-01-21 NOTE — Patient Instructions (Signed)
Labs today  Your physician has requested that you have an echocardiogram. Echocardiography is a painless test that uses sound waves to create images of your heart. It provides your doctor with information about the size and shape of your heart and how well your heart's chambers and valves are working. This procedure takes approximately one hour. There are no restrictions for this procedure.    Your physician has recommended that you have a pulmonary function test. Pulmonary Function Tests are a group of tests that measure how well air moves in and out of your lungs.  Your physician recommends that you schedule a follow-up appointment in: 4 months

## 2018-01-21 NOTE — Progress Notes (Signed)
ReDS Vest - 01/21/18 0900      ReDS Vest   Fitting Posture  Sitting    Height Marker  --   station C   Ruler Value  26    ReDS Value  32

## 2018-01-21 NOTE — Progress Notes (Signed)
Advanced Heart Failure Clinic Note   HF Cardiology: Dr Aundra Dubin  HPI:  [name] [name] is a 47 y.o. male with HTN, HLD, Systolic CHF, and history of polysubstance abuse (Cocaine, THC, and ETOH)  Presented to [facility] 08/20/16 with worsening dyspnea and CP.  He was diagnosed with systolic CHF with EF 25% on echo. Cath as below with normal coronaries and preserved cardiac output. Initial coox 50.5% so transiently placed on milrinone 0.125 mcg/kg/min. Pt tolerated wean with diuresis and adjustment of medications.   Echo 02/2017: EF up to 50% with mild diffuse hypokinesis.  He presents today followup of CHF.  He is stable symptomatically.  Breathing overall "ok." He is short of breath after walking 100 yards.  No orthopnea/PND.  No chest pain. Taking all meds.  Weight is up but has been eating more. He has only 1-2 drinks per week now.   REDS clip 32%  ECG (personally reviewed): NSR, early repolarization  Labs (7/18): digoxin 0.7 Labs (9/18): K 3.9, creatinine 0.88, BNP 20 Labs (5/19): K 4.1, creatinine 0.93, TSH normal, hgb 11.7  Review of systems complete and found to be negative unless listed in HPI.    Past medical History 1. Chronic systolic CHF: Nonischemic cardiomyopathy, heavy ETOH use versus long-standing HTN. New diagnosis 08/2016 . HIV negative, SPEP negative.  - LHC (4/18) with no CAD. - Echo (4/18): LVEF 15%, Mod MR, Mod LAE, RV mild reduced, Mild RAE.  - RHC (4/18): mean RA 1, PA 22/11, mean PCWP 15, CI 2.7 (on milrinone 0.125) - NICM work up ongoing.  ? Etiology from heavy ETOH use of long standing HTN with LVH on EKG - Echo (10/18): EF 50%, diffuse hypokinesis, normal RV size and systolic function, small IVC. 2. HTN:  Long standing and poorly controlled. LVH on EKG 3. Hyperlipidemia 4. Polysubstance abuse: h/o cocaine and marijuana.  5. MR: Moderate by 4/18 echo, likely functional.  6. ETOH abuse: Has cut back.  7. Sleep study 12/18 negative  Current Outpatient Medications   Medication Sig Dispense Refill  . carvedilol (COREG) 6.25 MG tablet Take 1 tablet (6.25 mg total) by mouth 2 (two) times daily with a meal. 60 tablet 2  . furosemide (LASIX) 20 MG tablet Take 1 tablet (20 mg total) by mouth daily as needed. For weight gain >3 lb in 1 day or >5 lb in 1 week. 30 tablet 2  . sacubitril-valsartan (ENTRESTO) 97-103 MG Take 1 tablet by mouth 2 (two) times daily. 180 tablet 3  . spironolactone (ALDACTONE) 25 MG tablet Take 1 tablet (25 mg total) by mouth daily. 30 tablet 2  . thiamine 100 MG tablet Take 1 tablet (100 mg total) by mouth daily. 30 tablet 0   No current facility-administered medications for this encounter.     No Known Allergies    Social History   Socioeconomic History  . Marital status: Single    Spouse name: Not on file  . Number of children: Not on file  . Years of education: Not on file  . Highest education level: Not on file  Occupational History  . Not on file  Social Needs  . Financial resource strain: Not on file  . Food insecurity:    Worry: Not on file    Inability: Not on file  . Transportation needs:    Medical: Not on file    Non-medical: Not on file  Tobacco Use  . Smoking status: Never Smoker  . Smokeless tobacco: Never Used  Substance  and Sexual Activity  . Alcohol use: Yes    Comment: (2) 24 oz beers every other day pt reports  . Drug use: Yes    Types: Marijuana    Comment: pt reports marijuana use occasionally  . Sexual activity: Yes    Birth control/protection: None  Lifestyle  . Physical activity:    Days per week: Not on file    Minutes per session: Not on file  . Stress: Not on file  Relationships  . Social connections:    Talks on phone: Not on file    Gets together: Not on file    Attends religious service: Not on file    Active member of club or organization: Not on file    Attends meetings of clubs or organizations: Not on file    Relationship status: Not on file  . Intimate partner violence:     Fear of current or ex partner: Not on file    Emotionally abused: Not on file    Physically abused: Not on file    Forced sexual activity: Not on file  Other Topics Concern  . Not on file  Social History Narrative  . Not on file      Family History  Problem Relation Age of Onset  . Diabetes Other   . Hypertension Other     Vitals:   01/21/18 0836  BP: 128/90  Pulse: 97  SpO2: 97%  Weight: 89.5 kg (197 lb 6.4 oz)   Wt Readings from Last 3 Encounters:  01/21/18 89.5 kg (197 lb 6.4 oz)  10/17/17 85.9 kg (189 lb 6.4 oz)  04/23/17 90.7 kg (200 lb)    PHYSICAL EXAM:  General: NAD Neck: No JVD, no thyromegaly or thyroid nodule.  Lungs: Clear to auscultation bilaterally with normal respiratory effort. CV: Nondisplaced PMI.  Heart regular S1/S2, no S3/S4, no murmur.  No peripheral edema.  No carotid bruit.  Normal pedal pulses.  Abdomen: Soft, nontender, no hepatosplenomegaly, no distention.  Skin: Intact without lesions or rashes.  Neurologic: Alert and oriented x 3.  Psych: Normal affect. Extremities: No clubbing or cyanosis. Olecranon bursa swelling on right elbow, not warm or red.  HEENT: Normal.   ASSESSMENT & PLAN: 1. Chronic systolic CHF: Nonischemic cardiomyopathy.  Possibly due to ETOH abuse versus long-standing HTN.  Echo (4/18) LVEF 15%, Mod MR, Mod LAE, RV mild reduced, Mild RAE.  Cath with no CAD.  Echo 02/2017 EF up to 50% with cessation of ETOH and treatment of HTN.  Still with NYHA class II-III symptoms but not volume overloaded on exam or by REDS clip (32%).   - He can continue prn Lasix use.  - Off digoxin with improved EF.  - Continue Coreg 6.25 mg BID. Intolerant to higher doses with fatigue.  - Continue Entresto 97/103 mg BID. BMET today.  - Continue Spiro 25 mg daily. - I will arrange for repeat echo.  - Given ongoing dyspnea without evidence for active CHF, will set up for PFTs. 2. HTN: BP stable on current regimen.  3. Polysubstance abuse: Tox  panel + for [facility] and Cocaine on admission 08/2016. - Denies cocaine use.  4. ETOH abuse:  Encouraged complete cessation. No change.   Loralie Champagne 01/21/2018

## 2018-01-29 ENCOUNTER — Ambulatory Visit (HOSPITAL_COMMUNITY)
Admission: RE | Admit: 2018-01-29 | Discharge: 2018-01-29 | Disposition: A | Discharge status: Home / Self Care | Payer: Self-pay | Source: Ambulatory Visit | Attending: Cardiology | Admitting: Cardiology

## 2018-01-29 DIAGNOSIS — I5022 Chronic systolic (congestive) heart failure: Secondary | ICD-10-CM | POA: Insufficient documentation

## 2018-01-29 LAB — PULMONARY FUNCTION TEST
DL/VA % PRED: 80 %
DL/VA: 4.04 ml/min/mmHg/L
DLCO unc % pred: 69 %
DLCO unc: 29.08 ml/min/mmHg
FEF 25-75 POST: 3.01 L/s
FEF 25-75 Pre: 2.74 L/sec
FEF2575-%Change-Post: 9 %
FEF2575-%Pred-Post: 73 %
FEF2575-%Pred-Pre: 67 %
FEV1-%CHANGE-POST: 3 %
FEV1-%PRED-PRE: 95 %
FEV1-%Pred-Post: 99 %
FEV1-PRE: 4.07 L
FEV1-Post: 4.22 L
FEV1FVC-%Change-Post: 4 %
FEV1FVC-%PRED-PRE: 84 %
FEV6-%Change-Post: 1 %
FEV6-%PRED-PRE: 110 %
FEV6-%Pred-Post: 112 %
FEV6-POST: 5.85 L
FEV6-PRE: 5.74 L
FEV6FVC-%Change-Post: 2 %
FEV6FVC-%PRED-POST: 99 %
FEV6FVC-%PRED-PRE: 97 %
FVC-%Change-Post: 0 %
FVC-%PRED-PRE: 113 %
FVC-%Pred-Post: 112 %
FVC-POST: 5.99 L
FVC-Pre: 6.03 L
POST FEV6/FVC RATIO: 98 %
PRE FEV6/FVC RATIO: 95 %
Post FEV1/FVC ratio: 70 %
Pre FEV1/FVC ratio: 67 %
RV % PRED: 114 %
RV: 2.75 L
TLC % PRED: 104 %
TLC: 8.75 L

## 2018-01-29 MED ORDER — ALBUTEROL SULFATE (2.5 MG/3ML) 0.083% IN NEBU
2.5000 mg | INHALATION_SOLUTION | Freq: Once | RESPIRATORY_TRACT | Status: AC
  Administered 2018-01-29: 2.5 mg via RESPIRATORY_TRACT

## 2018-02-10 ENCOUNTER — Ambulatory Visit (HOSPITAL_COMMUNITY): Payer: Self-pay | Attending: Cardiovascular Disease

## 2018-02-10 ENCOUNTER — Other Ambulatory Visit: Payer: Self-pay

## 2018-02-10 DIAGNOSIS — F1910 Other psychoactive substance abuse, uncomplicated: Secondary | ICD-10-CM | POA: Insufficient documentation

## 2018-02-10 DIAGNOSIS — E785 Hyperlipidemia, unspecified: Secondary | ICD-10-CM | POA: Insufficient documentation

## 2018-02-10 DIAGNOSIS — I110 Hypertensive heart disease with heart failure: Secondary | ICD-10-CM | POA: Insufficient documentation

## 2018-02-10 DIAGNOSIS — I5022 Chronic systolic (congestive) heart failure: Secondary | ICD-10-CM | POA: Insufficient documentation

## 2018-02-10 DIAGNOSIS — I071 Rheumatic tricuspid insufficiency: Secondary | ICD-10-CM | POA: Insufficient documentation

## 2018-08-28 ENCOUNTER — Other Ambulatory Visit (HOSPITAL_COMMUNITY): Payer: Self-pay

## 2018-08-28 ENCOUNTER — Telehealth (HOSPITAL_COMMUNITY): Payer: Self-pay | Admitting: Licensed Clinical Social Worker

## 2018-08-28 MED ORDER — SPIRONOLACTONE 25 MG PO TABS: 25.0000 mg | ORAL_TABLET | Freq: Every day | ORAL | 2 refills | Status: DC

## 2018-08-28 MED ORDER — FUROSEMIDE 20 MG PO TABS: 20.0000 mg | ORAL_TABLET | Freq: Every day | ORAL | 2 refills | Status: DC | PRN

## 2018-08-28 MED ORDER — CARVEDILOL 6.25 MG PO TABS: 6.2500 mg | ORAL_TABLET | Freq: Two times a day (BID) | ORAL | 2 refills | Status: DC

## 2018-08-28 NOTE — Telephone Encounter (Signed)
Clinic received notification that it is time for pt to submit re-enrollment form for [employer] Patient [place] where pt gets his [facility].  CSW called pt and informed- CSW requested application be sent to patient and informed pt to return completed application to clinic so we could complete physician portion and submit for review  Pt also state he is having trouble getting a hold of [facility] to get refills on his medications.  CSW informed pt of recent pharmacy changes and that all prescriptions are being filled through [facility] provided pt with number to pharmacy and instructions on how to request mail order.  CSW also noted that his medications are expired and sent request to clinic RN to send in updated prescriptions.  Will continue to follow and assist as needed  Jorge Ny, [facility] Desk#: [phone] Cell#: [phone]

## 2018-09-07 IMAGING — CT CT ANGIO CHEST
2 of 8 series · 18 of 46 positions shown · IV contrast (agent unspecified)
Comparison: Prior radiograph from earlier the same day.

CLINICAL DATA: Initial evaluation for acute shortness of breath,
tachycardia, chest pain.

EXAM:
CT ANGIOGRAPHY CHEST WITH CONTRAST
TECHNIQUE: Multidetector CT imaging of the chest was performed using the
standard protocol during bolus administration of intravenous
contrast. Multiplanar CT image reconstructions and MIPs were
obtained to evaluate the vascular anatomy.

[Series 6: thins · axial · 0.84mm/px · z∈[+920,+1240]mm · 15 of 352 slices shown]
[im 16/352  lung]
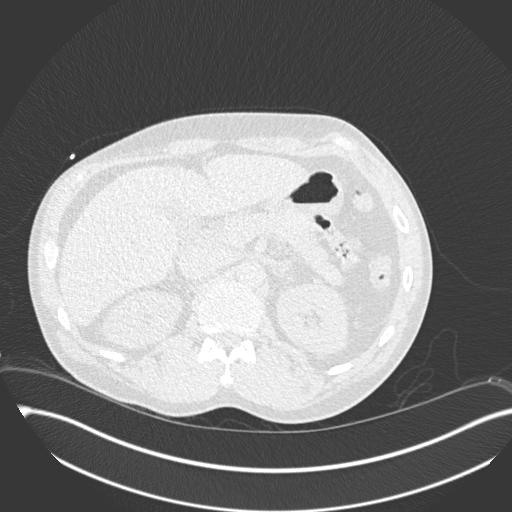
[im 48/352  soft-tissue]
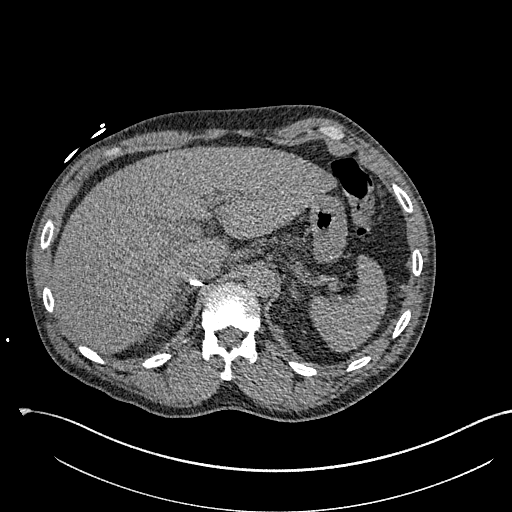
[im 64/352  lung]
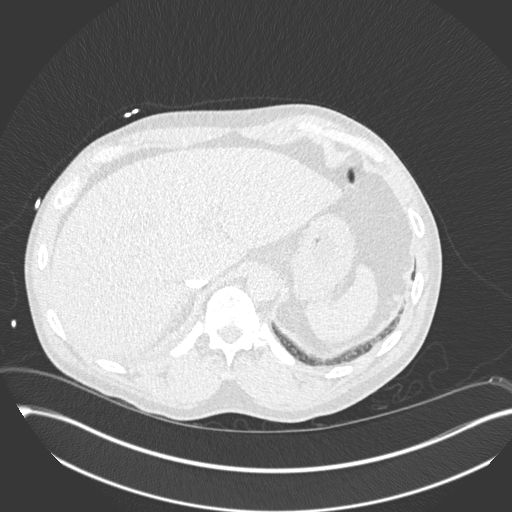
[im 80/352  soft-tissue]
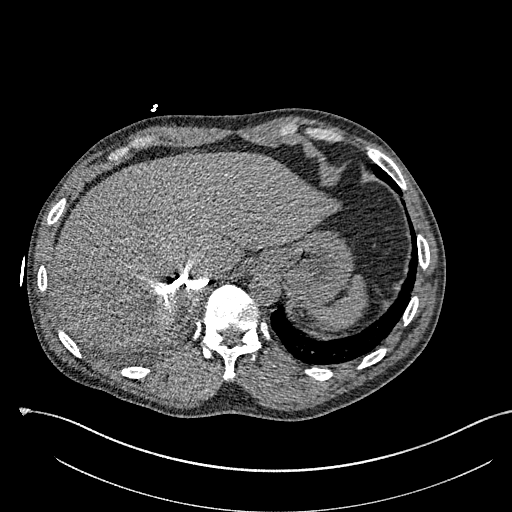
[im 112/352  lung]
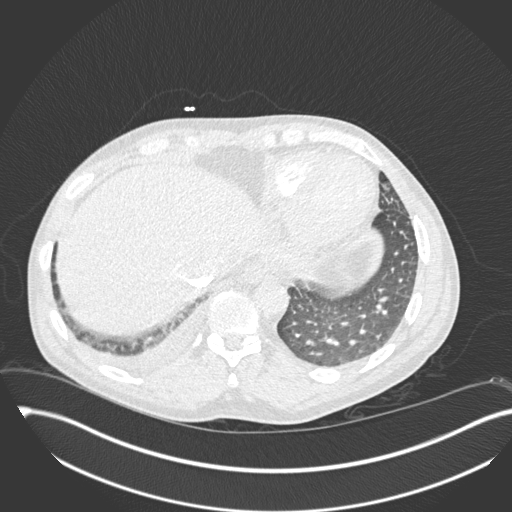
[im 128/352  soft-tissue]
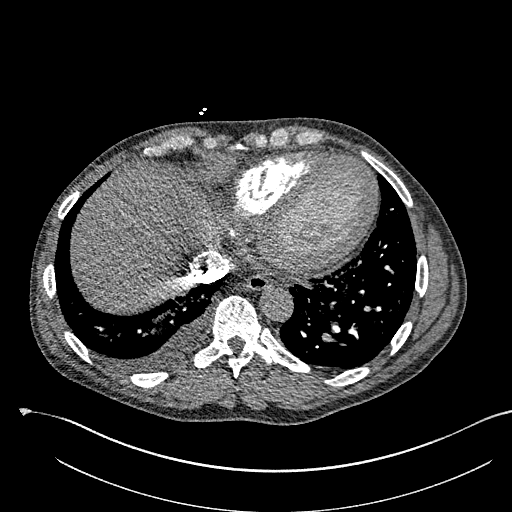
[im 160/352  lung]
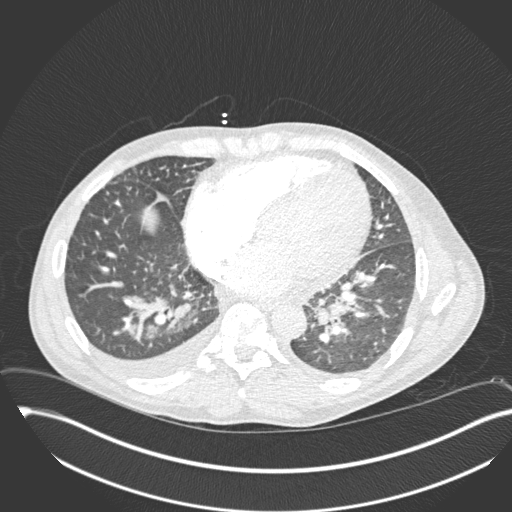
[im 176/352  soft-tissue]
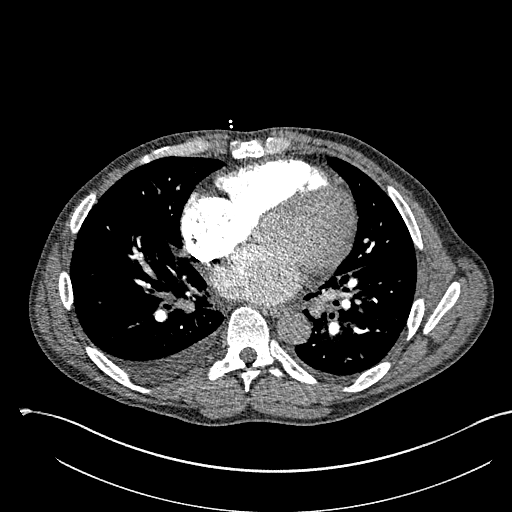
[im 192/352  lung]
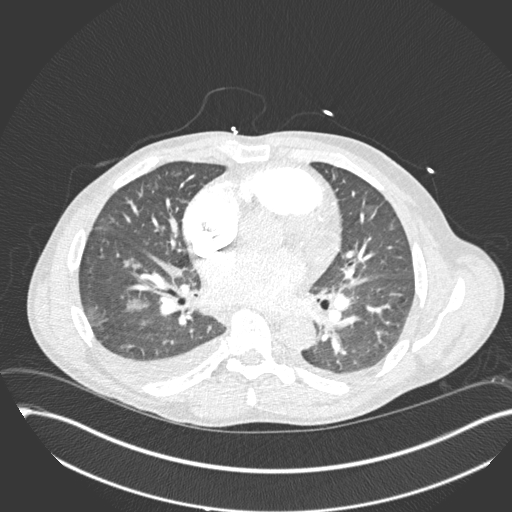
[im 224/352  soft-tissue]
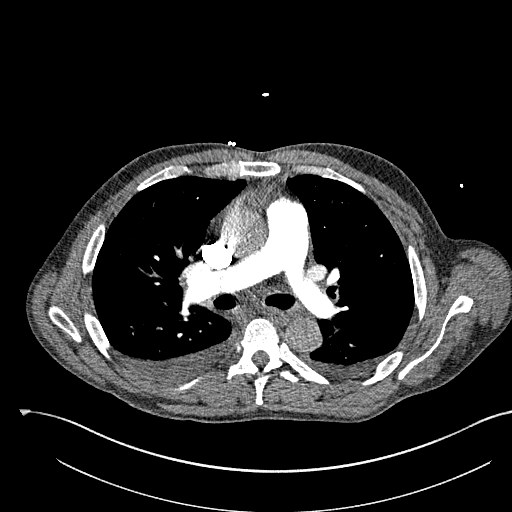
[im 240/352  lung]
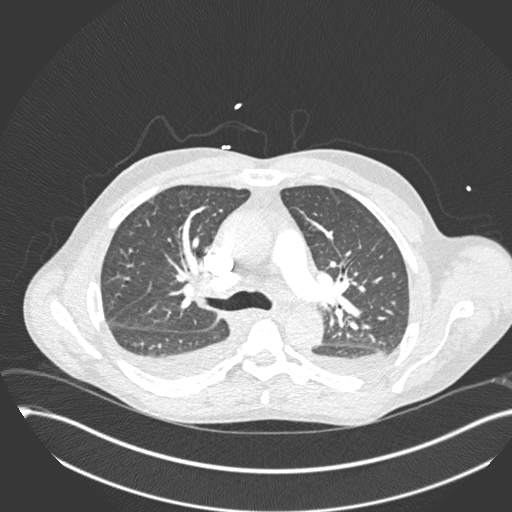
[im 272/352  soft-tissue]
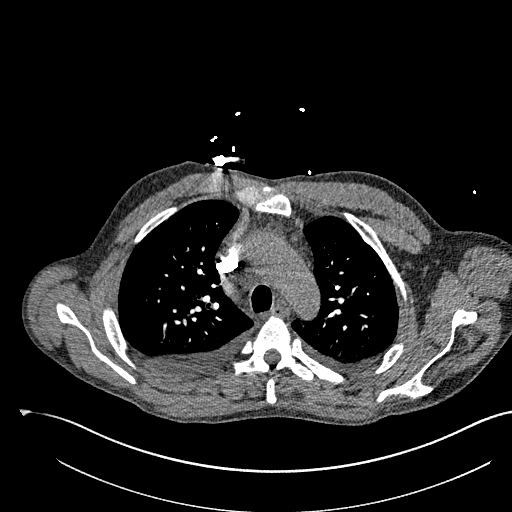
[im 288/352  lung]
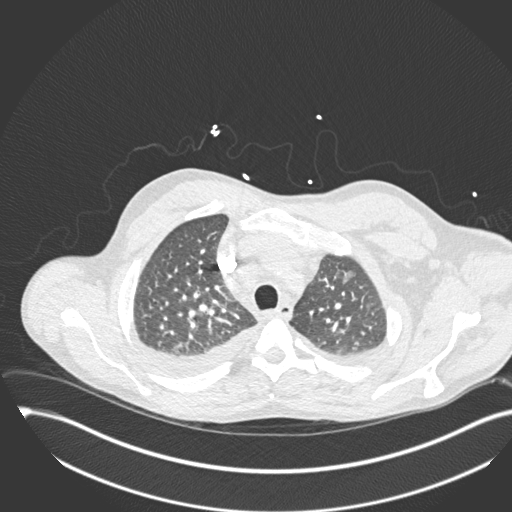
[im 304/352  soft-tissue]
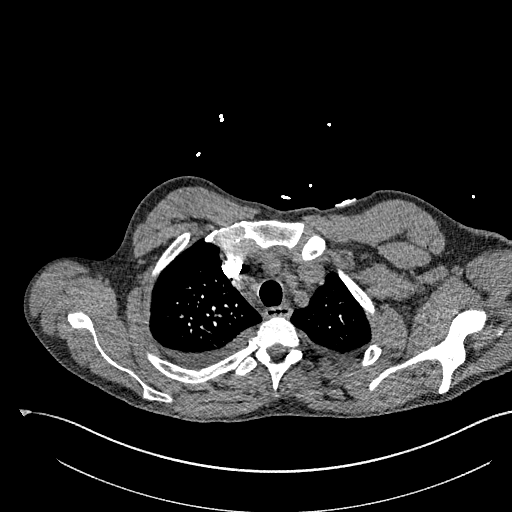
[im 336/352  lung]
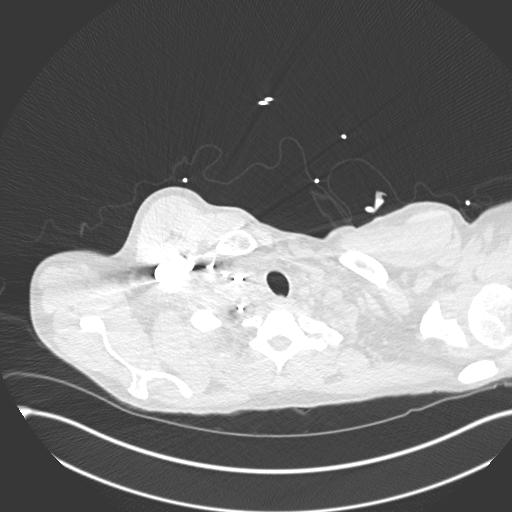

[Series 8: coronal mpr · coronal · 0.69mm/px · 3 of 151 slices shown]
[im 38/151  soft-tissue]
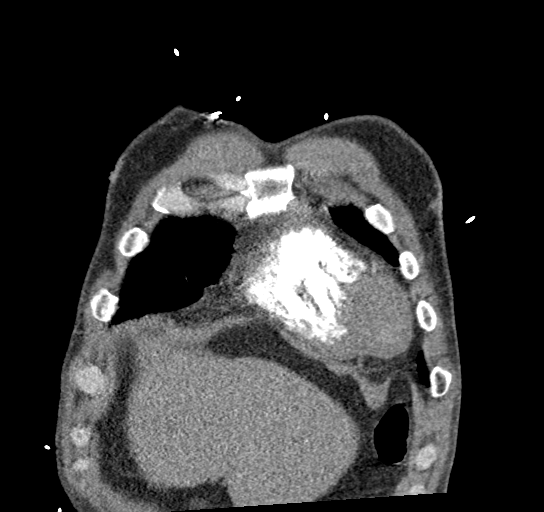
[im 76/151  soft-tissue]
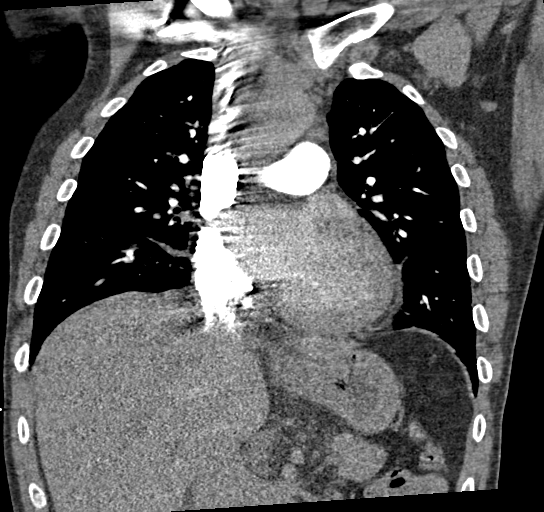
[im 113/151  soft-tissue]
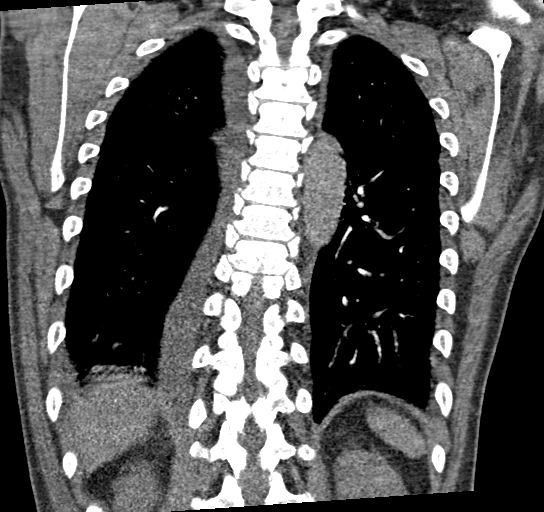

[18 of 46 positions shown; findings below may reference images not displayed]

FINDINGS: Cardiovascular: Intrathoracic aorta of normal caliber without acute
abnormality. Partially visualized great vessels grossly
unremarkable.

Cardiomegaly.  No pericardial effusion.

Pulmonary arterial tree adequately opacified for evaluation. Main
pulmonary artery within normal limits for size measuring 2.9 cm in
diameter. The the the no filling defect to suggest acute pulmonary
embolism identified. Re-formatted imaging confirms these findings.

Mediastinum/Nodes: Partially visualized thyroid is unremarkable.
Scattered mediastinal lymph nodes measuring up to 15 mm present
(series 5, image 37), indeterminate, but may be reactive. No
appreciable hilar adenopathy. No enlarged axillary nodes. Esophagus
within normal limits.

Lungs/Pleura: The layering bilateral pleural effusions, right
greater than left. Scattered interlobular septal thickening suggests
mild pulmonary interstitial edema. Scattered ground-glass and patchy
opacities within the right lower lobe, somewhat concerning for
pneumonia. No other focal infiltrates. No pneumothorax. No worrisome
pulmonary nodule or mass.

Upper Abdomen: Peripherally calcified cyst noted arising from the
upper pole left kidney, not well evaluated on this examination.
Remainder the visualized upper abdomen otherwise unremarkable.

Musculoskeletal: No acute osseous abnormality. No worrisome lytic or
blastic osseous lesions. Mild scoliosis noted.

Review of the MIP images confirms the above findings.
IMPRESSION: 1. No CT evidence for acute pulmonary embolism.
2. Patchy right lower lobe infiltrate, concerning for pneumonia.
3. Small moderate layering bilateral pleural effusions, right
greater than left.
4. Cardiomegaly with mild underlying pulmonary interstitial edema.

## 2018-09-10 ENCOUNTER — Telehealth (HOSPITAL_COMMUNITY): Payer: Self-pay | Admitting: Licensed Clinical Social Worker

## 2018-09-10 MED FILL — CARVEDILOL 6.25 MG TABLET: 6.25 | 30 days supply | Qty: 60 | Fill #0

## 2018-09-10 MED FILL — FUROSEMIDE 20 MG TABS: 20 | 30 days supply | Qty: 30 | Fill #0

## 2018-09-10 NOTE — Telephone Encounter (Signed)
CSW received call from pt regarding medication concerns.  Pt had tried to call [facility] to set up delivery of his medication but stated he was unable to get through to anyone.  CSW called pharmacy and had them fill pt coreg and lasix and set it up for delivery.  Pt also reports he mailed back completed [employer] application- CSW informed him it will be completed by the clinic and then turned in for review  CSW will continue to follow and assist as needed  Jorge Ny, [place] [facility] Desk#: [phone] Cell#: [phone]

## 2018-10-01 ENCOUNTER — Telehealth (HOSPITAL_COMMUNITY): Payer: Self-pay | Admitting: Licensed Clinical Social Worker

## 2018-10-01 NOTE — Telephone Encounter (Signed)
Clinic received notification that pt has been approved to receive Entresto through [employer] patient assistance foundation.  Pt ID: [id number] Expires: 10/11/2019  CSW called pt to inform of approval- pt has already been contacted by foundation and they have send out first shipment of medications.  CSW will continue to follow and assist as needed  Jorge Ny, [facility] Desk#: [phone] Cell#: [phone]

## 2018-11-04 MED FILL — CARVEDILOL 6.25 MG TABLET: 6.25 | 30 days supply | Qty: 60 | Fill #0

## 2018-11-04 MED FILL — FUROSEMIDE 20 MG TABS: 20 | 30 days supply | Qty: 30 | Fill #0

## 2019-01-01 MED FILL — CARVEDILOL 6.25 MG TABLET: 6.25 | 30 days supply | Qty: 60 | Fill #1

## 2019-01-21 ENCOUNTER — Other Ambulatory Visit (HOSPITAL_COMMUNITY): Payer: Self-pay | Admitting: Student

## 2019-01-21 MED FILL — DIGOXIN 0.125 MG TABLET: 125 | 30 days supply | Qty: 30 | Fill #0

## 2019-03-03 ENCOUNTER — Other Ambulatory Visit (HOSPITAL_COMMUNITY): Payer: Self-pay | Admitting: Cardiology

## 2019-03-03 MED FILL — CARVEDILOL 6.25 MG TABLET: 6.25 | 30 days supply | Qty: 60 | Fill #0

## 2019-03-25 MED FILL — DIGOXIN 0.125 MG TABLET: 125 | 30 days supply | Qty: 30 | Fill #0

## 2019-03-25 MED FILL — CARVEDILOL 6.25 MG TABLET: 6.25 | 30 days supply | Qty: 60 | Fill #0

## 2019-05-06 ENCOUNTER — Other Ambulatory Visit (HOSPITAL_COMMUNITY): Payer: Self-pay | Admitting: Internal Medicine

## 2019-06-08 ENCOUNTER — Other Ambulatory Visit (HOSPITAL_COMMUNITY): Payer: Self-pay | Admitting: Internal Medicine

## 2019-06-08 MED FILL — DIGOXIN 0.125 MG TABLET: 125 | 30 days supply | Qty: 30 | Fill #0

## 2019-06-08 MED FILL — CARVEDILOL 6.25 MG TABLET: 6.25 | 30 days supply | Qty: 60 | Fill #1

## 2019-07-13 ENCOUNTER — Ambulatory Visit (HOSPITAL_COMMUNITY)
Admission: RE | Admit: 2019-07-13 | Discharge: 2019-07-13 | Disposition: A | Discharge status: Home / Self Care | Payer: Medicaid Other | Source: Ambulatory Visit | Attending: Cardiology | Admitting: Cardiology

## 2019-07-13 ENCOUNTER — Other Ambulatory Visit: Payer: Self-pay

## 2019-07-13 ENCOUNTER — Encounter (HOSPITAL_COMMUNITY): Payer: Self-pay

## 2019-07-13 VITALS — BP 166/118 | HR 112 | Wt 207.0 lb

## 2019-07-13 DIAGNOSIS — F1010 Alcohol abuse, uncomplicated: Secondary | ICD-10-CM | POA: Insufficient documentation

## 2019-07-13 DIAGNOSIS — I428 Other cardiomyopathies: Secondary | ICD-10-CM | POA: Insufficient documentation

## 2019-07-13 DIAGNOSIS — Z79899 Other long term (current) drug therapy: Secondary | ICD-10-CM | POA: Diagnosis not present

## 2019-07-13 DIAGNOSIS — F1410 Cocaine abuse, uncomplicated: Secondary | ICD-10-CM

## 2019-07-13 DIAGNOSIS — F1411 Cocaine abuse, in remission: Secondary | ICD-10-CM | POA: Diagnosis not present

## 2019-07-13 DIAGNOSIS — R0602 Shortness of breath: Secondary | ICD-10-CM

## 2019-07-13 DIAGNOSIS — Z8249 Family history of ischemic heart disease and other diseases of the circulatory system: Secondary | ICD-10-CM | POA: Insufficient documentation

## 2019-07-13 DIAGNOSIS — I5022 Chronic systolic (congestive) heart failure: Secondary | ICD-10-CM | POA: Diagnosis present

## 2019-07-13 DIAGNOSIS — I110 Hypertensive heart disease with heart failure: Secondary | ICD-10-CM | POA: Insufficient documentation

## 2019-07-13 DIAGNOSIS — E785 Hyperlipidemia, unspecified: Secondary | ICD-10-CM | POA: Insufficient documentation

## 2019-07-13 DIAGNOSIS — I10 Essential (primary) hypertension: Secondary | ICD-10-CM

## 2019-07-13 LAB — BASIC METABOLIC PANEL
Anion gap: 15 (ref 5–15)
BUN: 6 mg/dL (ref 6–20)
CO2: 24 mmol/L (ref 22–32)
Calcium: 9.1 mg/dL (ref 8.9–10.3)
Chloride: 97 mmol/L — ABNORMAL LOW (ref 98–111)
Creatinine, Ser: 0.78 mg/dL (ref 0.61–1.24)
GFR calc Af Amer: >60 mL/min (ref >60)
GFR calc non Af Amer: >60 mL/min (ref >60)
Glucose, Bld: 93 mg/dL (ref 70–99)
Potassium: 4.3 mmol/L (ref 3.5–5.1)
Sodium: 136 mmol/L (ref 135–145)

## 2019-07-13 LAB — BRAIN NATRIURETIC PEPTIDE: B Natriuretic Peptide: 28.1 pg/mL (ref 0.0–100.0)

## 2019-07-13 MED ORDER — CARVEDILOL 6.25 MG PO TABS: 6.2500 mg | ORAL_TABLET | Freq: Two times a day (BID) | ORAL | 3 refills | Status: DC

## 2019-07-13 MED ORDER — SPIRONOLACTONE 25 MG PO TABS: 12.5000 mg | ORAL_TABLET | Freq: Every day | ORAL | 1 refills | Status: DC

## 2019-07-13 MED FILL — CARVEDILOL 6.25 MG TABLET: 6.25 | 30 days supply | Qty: 60 | Fill #0

## 2019-07-13 MED FILL — SPIRONOLACTONE 25 MG TABS: 25 | 30 days supply | Qty: 15 | Fill #0

## 2019-07-13 NOTE — Progress Notes (Signed)
Advanced Heart Failure Clinic Note   HF Cardiology: Dr Aundra Dubin PCP: none   HPI:  [name] [name] is a 49 y.o. male with HTN, HLD, Systolic CHF, and history of polysubstance abuse (Cocaine, THC, and ETOH)  Presented to [facility] 08/20/16 with worsening dyspnea and CP.  He was diagnosed with systolic CHF with EF 0000000 on echo. Cath as below with normal coronaries and preserved cardiac output. Initial coox 50.5% so transiently placed on milrinone 0.125 mcg/kg/min. Pt tolerated wean with diuresis and adjustment of medications.   Echo 02/2017: EF up to 50% with mild diffuse hypokinesis.  ECHO 01/2018 EF 60-65%   Last office visit September 2019. At that time he was set up for repeat ECHO that showed EF 60-65%. PFTs showed minimal obstructive airway disease.   No hospitalizations 2020.   Today he returns for HF follow up. He has not been seen since September 2019. Overall feeling fair. Asking about disability. SOB with exertion. Takes rest breaks when walking. Denies PND/Orthopnea. Appetite ok. No fever or chills. He has not been weighing at home.  Taking all medications except he is out of coreg and spiro. Says he has been out of coreg for a week. He doesn't remember the last time he took spironolactone. He has not worked since 2018. Lives alone. Denies cocaine use.   Labs (7/18): digoxin 0.7 Labs (9/18): K 3.9, creatinine 0.88, BNP 20 Labs (5/19): K 4.1, creatinine 0.93, TSH normal, hgb 11.7  Review of systems complete and found to be negative unless listed in HPI.    Past medical History 1. Chronic systolic CHF: Nonischemic cardiomyopathy, heavy ETOH use versus long-standing HTN. New diagnosis 08/2016 . HIV negative, SPEP negative.  - LHC (4/18) with no CAD. - Echo (4/18): LVEF 15%, Mod MR, Mod LAE, RV mild reduced, Mild RAE.  - RHC (4/18): mean RA 1, PA 22/11, mean PCWP 15, CI 2.7 (on milrinone 0.125) - NICM work up ongoing.  ? Etiology from heavy ETOH use of long standing HTN with LVH on EKG -  Echo (10/18): EF 50%, diffuse hypokinesis, normal RV size and systolic function, small IVC. 2. HTN:  Long standing and poorly controlled. LVH on EKG 3. Hyperlipidemia 4. Polysubstance abuse: h/o cocaine and marijuana.  5. MR: Moderate by 4/18 echo, likely functional.  6. ETOH abuse: Has cut back.  7. Sleep study 12/18 negative  Current Outpatient Medications  Medication Sig Dispense Refill  . furosemide (LASIX) 20 MG tablet Take 1 tablet (20 mg total) by mouth daily as needed. For weight gain >3 lb in 1 day or >5 lb in 1 week. 30 tablet 2  . sacubitril-valsartan (ENTRESTO) 97-103 MG Take 1 tablet by mouth 2 (two) times daily. 180 tablet 3   No current facility-administered medications for this encounter.    No Known Allergies    Social History   Socioeconomic History  . Marital status: Single    Spouse name: Not on file  . Number of children: Not on file  . Years of education: Not on file  . Highest education level: Not on file  Occupational History  . Not on file  Tobacco Use  . Smoking status: Never Smoker  . Smokeless tobacco: Never Used  Substance and Sexual Activity  . Alcohol use: Yes    Comment: (2) 24 oz beers every other day pt reports  . Drug use: Yes    Types: Marijuana    Comment: pt reports marijuana use occasionally  . Sexual activity: Yes  Birth control/protection: None  Other Topics Concern  . Not on file  Social History Narrative  . Not on file   Social Determinants of Health   Financial Resource Strain:   . Difficulty of Paying Living Expenses: Not on file  Food Insecurity:   . Worried About Charity fundraiser in the Last Year: Not on file  . Ran Out of Food in the Last Year: Not on file  Transportation Needs:   . Lack of Transportation (Medical): Not on file  . Lack of Transportation (Non-Medical): Not on file  Physical Activity:   . Days of Exercise per Week: Not on file  . Minutes of Exercise per Session: Not on file  Stress:   .  Feeling of Stress : Not on file  Social Connections:   . Frequency of Communication with Friends and Family: Not on file  . Frequency of Social Gatherings with Friends and Family: Not on file  . Attends Religious Services: Not on file  . Active Member of Clubs or Organizations: Not on file  . Attends Archivist Meetings: Not on file  . Marital Status: Not on file  Intimate Partner Violence:   . Fear of Current or Ex-Partner: Not on file  . Emotionally Abused: Not on file  . Physically Abused: Not on file  . Sexually Abused: Not on file      Family History  Problem Relation Age of Onset  . Diabetes Other   . Hypertension Other     Vitals:   07/13/19 0913  BP: (!) 166/118  Pulse: (!) 112  SpO2: 97%  Weight: 93.9 kg (207 lb)   Wt Readings from Last 3 Encounters:  07/13/19 93.9 kg (207 lb)  01/21/18 89.5 kg (197 lb 6.4 oz)  10/17/17 85.9 kg (189 lb 6.4 oz)    PHYSICAL EXAM:  General:  No resp difficulty HEENT: normal Neck: supple. no JVD. Carotids 2+ bilat; no bruits. No lymphadenopathy or thryomegaly appreciated. Cor: PMI nondisplaced. Regular rate & rhythm. No rubs, gallops or murmurs. Lungs: clear Abdomen: soft, nontender, nondistended. No hepatosplenomegaly. No bruits or masses. Good bowel sounds. Extremities: no cyanosis, clubbing, rash, edema Neuro: alert & orientedx3, cranial nerves grossly intact. moves all 4 extremities w/o difficulty. Affect pleasant  ASSESSMENT & PLAN: 1. Chronic systolic CHF: Nonischemic cardiomyopathy.  Possibly due to ETOH abuse versus long-standing HTN.  Echo (4/18) LVEF 15%, Mod MR, Mod LAE, RV mild reduced, Mild RAE.  Cath with no CAD.  Echo 02/2017 EF up to 50% with cessation of ETOH and treatment of HTN.  ECHO 2019 EF 60-65%.  - NYHA III. Volume status stable.  - Restart coreg 6.25 mg twice a day.  - Continue Entresto 97/103 mg BID. BMET today and in 7 days.  - Restart spiro 12.5 mg daily - Check BMET, BNP  2. HTN:  Uncontrolled. Restart coreg and spiro today.  3. Polysubstance abuse: Tox panel + for [facility] and Cocaine on admission 08/2016. - Denies cocaine use.  4. ETOH abuse: Rarely drinks alcohol.   Follow up in 2 weeks. Refer to SW for PCP.  Luetta Piazza 07/13/2019

## 2019-07-13 NOTE — Patient Instructions (Signed)
Lab work done today. We will notify you of any abnormal lab work. No news is good news!  You have been referred to our heart failure social worker in order to help you get a PCP.  STOP Digoxin  START Carvedilol 6.25mg  tab two times daily.  START Spironolactone 12.5mg  (0.5 tab) daily.  Your physician has requested that you have an echocardiogram. Echocardiography is a painless test that uses sound waves to create images of your heart. It provides your doctor with information about the size and shape of your heart and how well your heart's chambers and valves are working. This procedure takes approximately one hour. There are no restrictions for this procedure. This will be at your follow up appointment.  Please follow up with the [facility] in 2 weeks.  At the [facility], you and your health needs are our priority. As part of our continuing mission to provide you with exceptional heart care, we have created designated Provider Care Teams. These Care Teams include your primary Cardiologist (physician) and Advanced Practice Providers (APPs- Physician Assistants and Nurse Practitioners) who all work together to provide you with the care you need, when you need it.   You may see any of the following providers on your designated Care Team at your next follow up: Marland Kitchen Dr Glori Bickers . Dr Loralie Champagne . Darrick Grinder, NP . Lyda Jester, PA . Audry Riles, PharmD   Please be sure to bring in all your medications bottles to every appointment.

## 2019-07-13 NOTE — Addendum Note (Signed)
Encounter addended by: Jorge Ny, LCSW on: 07/13/2019 11:29 AM  Actions taken: Clinical Note Signed

## 2019-07-13 NOTE — Progress Notes (Signed)
CSW consulted to help set pt up with PCP.  Pt without insurance so discussed [facility] facilities that accept self-pay patients- Patient is agreeable to Primary Care at [facility] to establish PCP.  Appt set up for April 15th at 10:30am- pt informed  CSW will continue to follow and assist as needed  [name] [name], [facility] Desk#: [phone] Cell#: [phone]

## 2019-07-30 ENCOUNTER — Ambulatory Visit (HOSPITAL_COMMUNITY)
Admission: RE | Admit: 2019-07-30 | Discharge: 2019-07-30 | Disposition: A | Discharge status: Home / Self Care | Payer: Medicaid Other | Source: Ambulatory Visit | Attending: Adult Health | Admitting: Adult Health

## 2019-07-30 ENCOUNTER — Encounter (HOSPITAL_COMMUNITY): Payer: Self-pay

## 2019-07-30 ENCOUNTER — Other Ambulatory Visit: Payer: Self-pay

## 2019-07-30 ENCOUNTER — Ambulatory Visit (HOSPITAL_BASED_OUTPATIENT_CLINIC_OR_DEPARTMENT_OTHER)
Admission: RE | Admit: 2019-07-30 | Discharge: 2019-07-30 | Disposition: A | Discharge status: Home / Self Care | Payer: Medicaid Other | Source: Ambulatory Visit | Attending: Adult Health | Admitting: Adult Health

## 2019-07-30 VITALS — BP 150/110 | HR 109 | Wt 198.0 lb

## 2019-07-30 DIAGNOSIS — R0600 Dyspnea, unspecified: Secondary | ICD-10-CM | POA: Insufficient documentation

## 2019-07-30 DIAGNOSIS — I5022 Chronic systolic (congestive) heart failure: Secondary | ICD-10-CM

## 2019-07-30 DIAGNOSIS — F1410 Cocaine abuse, uncomplicated: Secondary | ICD-10-CM | POA: Diagnosis not present

## 2019-07-30 DIAGNOSIS — R5383 Other fatigue: Secondary | ICD-10-CM

## 2019-07-30 DIAGNOSIS — E785 Hyperlipidemia, unspecified: Secondary | ICD-10-CM | POA: Diagnosis not present

## 2019-07-30 DIAGNOSIS — I428 Other cardiomyopathies: Secondary | ICD-10-CM | POA: Diagnosis not present

## 2019-07-30 DIAGNOSIS — R0602 Shortness of breath: Secondary | ICD-10-CM

## 2019-07-30 DIAGNOSIS — I110 Hypertensive heart disease with heart failure: Secondary | ICD-10-CM | POA: Diagnosis not present

## 2019-07-30 DIAGNOSIS — F1010 Alcohol abuse, uncomplicated: Secondary | ICD-10-CM | POA: Diagnosis not present

## 2019-07-30 DIAGNOSIS — Z79899 Other long term (current) drug therapy: Secondary | ICD-10-CM | POA: Diagnosis not present

## 2019-07-30 DIAGNOSIS — F1210 Cannabis abuse, uncomplicated: Secondary | ICD-10-CM | POA: Insufficient documentation

## 2019-07-30 DIAGNOSIS — R000 Tachycardia, unspecified: Secondary | ICD-10-CM

## 2019-07-30 LAB — BASIC METABOLIC PANEL
Anion gap: 11 (ref 5–15)
BUN: 11 mg/dL (ref 6–20)
CO2: 23 mmol/L (ref 22–32)
Calcium: 9.1 mg/dL (ref 8.9–10.3)
Chloride: 101 mmol/L (ref 98–111)
Creatinine, Ser: 0.81 mg/dL (ref 0.61–1.24)
GFR calc Af Amer: >60 mL/min (ref >60)
GFR calc non Af Amer: >60 mL/min (ref >60)
Glucose, Bld: 101 mg/dL — ABNORMAL HIGH (ref 70–99)
Potassium: 4.6 mmol/L (ref 3.5–5.1)
Sodium: 135 mmol/L (ref 135–145)

## 2019-07-30 LAB — CBC
HCT: 43.5 % (ref 39.0–52.0)
Hemoglobin: 15 g/dL (ref 13.0–17.0)
MCH: 36.7 pg — ABNORMAL HIGH (ref 26.0–34.0)
MCHC: 34.5 g/dL (ref 30.0–36.0)
MCV: 106.4 fL — ABNORMAL HIGH (ref 80.0–100.0)
Platelets: 320 10*3/uL (ref 150–400)
RBC: 4.09 MIL/uL — ABNORMAL LOW (ref 4.22–5.81)
RDW: 13.2 % (ref 11.5–15.5)
WBC: 6.2 10*3/uL (ref 4.0–10.5)
nRBC: 0 % (ref 0.0–0.2)

## 2019-07-30 LAB — TSH: TSH: 2.117 u[IU]/mL (ref 0.350–4.500)

## 2019-07-30 LAB — T4, FREE: Free T4: 0.72 ng/dL (ref 0.61–1.12)

## 2019-07-30 MED ORDER — CARVEDILOL 12.5 MG PO TABS: 12.5000 mg | ORAL_TABLET | Freq: Two times a day (BID) | ORAL | 3 refills | Status: DC

## 2019-07-30 MED ORDER — SPIRONOLACTONE 25 MG PO TABS: 25.0000 mg | ORAL_TABLET | Freq: Every day | ORAL | 3 refills | Status: DC

## 2019-07-30 NOTE — Progress Notes (Signed)
  Echocardiogram 2D Echocardiogram has been performed.  Jennette Dubin 07/30/2019, 8:55 AM

## 2019-07-30 NOTE — Patient Instructions (Addendum)
Lab work done today. We will notify you of any abnormal lab work. No news is good news!   A chest x-ray takes a picture of the organs and structures inside the chest, including the heart, lungs, and blood vessels. This test can show several things, including, whether the heart is enlarges; whether fluid is building up in the lungs; and whether pacemaker / defibrillator leads are still in place.   INCREASE Carvedilol to 12.5mg  tab twice daily.  REFILLED Spironolactone 25mg  tab daily.  You have been referred to Pulmonology. Their office will be in contact with you in order to schedule an appointment.  Please follow up with the [facility] in 4 months.   At the [facility], you and your health needs are our priority. As part of our continuing mission to provide you with exceptional heart care, we have created designated Provider Care Teams. These Care Teams include your primary Cardiologist (physician) and Advanced Practice Providers (APPs- Physician Assistants and Nurse Practitioners) who all work together to provide you with the care you need, when you need it.   You may see any of the following providers on your designated Care Team at your next follow up: Marland Kitchen Dr Glori Bickers . Dr Loralie Champagne . Darrick Grinder, NP . Lyda Jester, PA . Audry Riles, PharmD   Please be sure to bring in all your medications bottles to every appointment.

## 2019-07-30 NOTE — Progress Notes (Signed)
Advanced Heart Failure Clinic Note   HF Cardiology: Dr Aundra Dubin PCP: none   HPI: [name] [name] is a 49 y.o. male with HTN, HLD, Systolic CHF, and history of polysubstance abuse (Cocaine, THC, and ETOH)  Presented to [facility] 08/20/16 with worsening dyspnea and CP.  He was diagnosed with systolic CHF with EF 0000000 on echo. Cath as below with normal coronaries and preserved cardiac output. Initial coox 50.5% so transiently placed on milrinone 0.125 mcg/kg/min. Pt tolerated wean with diuresis and adjustment of medications.   Echo 02/2017: EF up to 50% with mild diffuse hypokinesis.  ECHO 01/2018 EF 60-65%   Last office visit September 2019. At that time he was set up for repeat ECHO that showed EF 60-65%. PFTs showed minimal obstructive airway disease.   No hospitalizations 2020.    Today he returns for HF follow up. Last visit coreg was restarted at 6.25 mg twice a day. Complaining pain in left foot pain and ongoing shortness of breath. Overall feeling fair. SOB with exertion.  Denies PND/Orthopnea. Appetite ok. No fever or chills. Weight at home has been stable. Taking all medications. He does not smoke.   Labs (7/18): digoxin 0.7 Labs (9/18): K 3.9, creatinine 0.88, BNP 20 Labs (5/19): K 4.1, creatinine 0.93, TSH normal, hgb 11.7  Review of systems complete and found to be negative unless listed in HPI.    Past medical History 1. Chronic systolic CHF: Nonischemic cardiomyopathy, heavy ETOH use versus long-standing HTN. New diagnosis 08/2016 . HIV negative, SPEP negative.  - LHC (4/18) with no CAD. - Echo (4/18): LVEF 15%, Mod MR, Mod LAE, RV mild reduced, Mild RAE.  - RHC (4/18): mean RA 1, PA 22/11, mean PCWP 15, CI 2.7 (on milrinone 0.125) - NICM work up ongoing.  ? Etiology from heavy ETOH use of long standing HTN with LVH on EKG - Echo (10/18): EF 50%, diffuse hypokinesis, normal RV size and systolic function, small IVC. 2. HTN:  Long standing and poorly controlled. LVH on EKG 3.  Hyperlipidemia 4. Polysubstance abuse: h/o cocaine and marijuana.  5. MR: Moderate by 4/18 echo, likely functional.  6. ETOH abuse: Has cut back.  7. Sleep study 12/18 negative  Current Outpatient Medications  Medication Sig Dispense Refill  . carvedilol (COREG) 6.25 MG tablet Take 1 tablet (6.25 mg total) by mouth 2 (two) times daily. 180 tablet 3  . furosemide (LASIX) 20 MG tablet Take 1 tablet (20 mg total) by mouth daily as needed. For weight gain >3 lb in 1 day or >5 lb in 1 week. 30 tablet 2  . sacubitril-valsartan (ENTRESTO) 97-103 MG Take 1 tablet by mouth 2 (two) times daily. 180 tablet 3  . spironolactone (ALDACTONE) 25 MG tablet Take 0.5 tablets (12.5 mg total) by mouth daily. (Patient taking differently: Take 25 mg by mouth daily. ) 45 tablet 1   No current facility-administered medications for this encounter.    No Known Allergies    Social History   Socioeconomic History  . Marital status: Single    Spouse name: Not on file  . Number of children: Not on file  . Years of education: Not on file  . Highest education level: Not on file  Occupational History  . Not on file  Tobacco Use  . Smoking status: Never Smoker  . Smokeless tobacco: Never Used  Substance and Sexual Activity  . Alcohol use: Yes    Comment: (2) 24 oz beers every other day pt reports  .  Drug use: Yes    Types: Marijuana    Comment: pt reports marijuana use occasionally  . Sexual activity: Yes    Birth control/protection: None  Other Topics Concern  . Not on file  Social History Narrative  . Not on file   Social Determinants of Health   Financial Resource Strain:   . Difficulty of Paying Living Expenses:   Food Insecurity:   . Worried About Charity fundraiser in the Last Year:   . Arboriculturist in the Last Year:   Transportation Needs:   . Film/video editor (Medical):   Marland Kitchen Lack of Transportation (Non-Medical):   Physical Activity:   . Days of Exercise per Week:   . Minutes of  Exercise per Session:   Stress:   . Feeling of Stress :   Social Connections:   . Frequency of Communication with Friends and Family:   . Frequency of Social Gatherings with Friends and Family:   . Attends Religious Services:   . Active Member of Clubs or Organizations:   . Attends Archivist Meetings:   Marland Kitchen Marital Status:   Intimate Partner Violence:   . Fear of Current or Ex-Partner:   . Emotionally Abused:   Marland Kitchen Physically Abused:   . Sexually Abused:       Family History  Problem Relation Age of Onset  . Diabetes Other   . Hypertension Other     Vitals:   07/30/19 0904  BP: (!) 150/110  Pulse: (!) 109  SpO2: 98%  Weight: 89.8 kg (198 lb)   Wt Readings from Last 3 Encounters:  07/30/19 89.8 kg (198 lb)  07/13/19 93.9 kg (207 lb)  01/21/18 89.5 kg (197 lb 6.4 oz)    PHYSICAL EXAM:  General:  Well appearing. No resp difficulty HEENT: normal Neck: supple. no JVD. Carotids 2+ bilat; no bruits. No lymphadenopathy or thryomegaly appreciated. Cor: PMI nondisplaced. Regular rate & rhythm. No rubs, gallops or murmurs. Lungs: clear Abdomen: soft, nontender, nondistended. No hepatosplenomegaly. No bruits or masses. Good bowel sounds. Extremities: no cyanosis, clubbing, rash, edema Neuro: alert & orientedx3, cranial nerves grossly intact. moves all 4 extremities w/o difficulty. Affect pleasant  EKG: Sinus Tach 108 bpm personally reviewed.   ASSESSMENT & PLAN: 1. Chronic systolic CHF: Nonischemic cardiomyopathy.  Possibly due to ETOH abuse versus long-standing HTN.  Echo (4/18) LVEF 15%, Mod MR, Mod LAE, RV mild reduced, Mild RAE.  Cath with no CAD.  Echo 02/2017 EF up to 50% with cessation of ETOH and treatment of HTN.  ECHO 2019 EF 60-65%.  ECHO today reviewed by Dr Aundra Dubin EF 60-65%. Discussed results.  - Increase coreg to 12.5 mg twice a day.   - Continue Entresto 97/103 mg BID.  -Continue spironolactone to 25 mg daily.  - Check BMET  2. HTN: Uncontrolled.    Increase coreg as noted above 3. Polysubstance abuse: Tox panel + for [facility] and Cocaine on admission 08/2016. - Denies cocaine use.  4. ETOH abuse: Rarely drinks alcohol.  5. Dyspnea -Does not have volume overload. CXR today.  -O2 sats stable with ambulation.He did endorse dyspnea with exertion.  Does not need oxygen.  - Refer to pulmonary. Dyspnea does not appear to be cardiac related.  6. Fatigue Check CBC, TSH, Free T3, Free T43     Referred to Primary Care and has appointment 09/03/19 Greater than 50% of the (total minutes 25) visit spent in counseling/coordination of care regarding the above.  Follow up in 4 months. Should be able to send to general cardiology at that time.   Latrina Guttman NP-C  07/30/2019

## 2019-07-31 ENCOUNTER — Telehealth (HOSPITAL_COMMUNITY): Payer: Self-pay

## 2019-07-31 LAB — T3, FREE: T3, Free: 3.3 pg/mL (ref 2.0–4.4)

## 2019-07-31 NOTE — Telephone Encounter (Signed)
Reviewed CXR results. Pt verbalized understanding. No further questions at this time.

## 2019-07-31 NOTE — Telephone Encounter (Signed)
-----   Message from Conrad Boley, NP sent at 07/30/2019  3:59 PM EST ----- CXR ok. No abnormalities. Please call.

## 2019-07-31 NOTE — Telephone Encounter (Signed)
Received letter from patients lawyer requesting letter to assist with approval of disability.  Patient in clinic yesterday and discussed per note.  Per note, pt heart function improved to normal and no longer requires visits a this office.  Pt referred to general cardiology for future follow ups.  Pt also had chest xray and referral to pulmonary for sob.  D/w RN [place] who was present for patients appointment states patient was advised by NP that we could not approve disability as his heart function was normal and offers no limitations. Called patient to discuss however no answer and mailbox is full. Will make another attempt later today.

## 2019-08-04 NOTE — Telephone Encounter (Signed)
Spoke with patient aware of NP not approving letter for his disability case as his heart function is normal. Pt reports he is to see pulmonary specialist. Advised to be evaluated with them and see what they find if it can help his case. Verbalized understanding. Lawyers letter mailed back to patient.

## 2019-08-28 ENCOUNTER — Telehealth (HOSPITAL_COMMUNITY): Payer: Self-pay | Admitting: Pharmacist

## 2019-08-28 NOTE — Telephone Encounter (Signed)
Received message from [employer] that patient is due for re-enrollment in [facility] patient assistance application (expires 0000000). Reached out to patient to work on re-submitting application. Patient did not answer and I was unable to leave a VM.  Audry Riles, PharmD, BCPS, BCCP, CPP Heart Failure Clinic Pharmacist [phone]

## 2019-09-02 ENCOUNTER — Telehealth: Payer: Self-pay

## 2019-09-02 NOTE — Telephone Encounter (Signed)
Called patient to do their pre-visit COVID screening.  Call went to voicemail, which was full. Unable to do prescreening.

## 2019-09-02 NOTE — Patient Instructions (Signed)
Thank you for choosing Primary Care at [facility] to be your medical home!    [name] [name] was seen by Melina Schools, DO today.   [name] [name]'s primary care provider is Phill Myron, DO.   For the best care possible, you should try to see Phill Myron, DO whenever you come to the clinic.   We look forward to seeing you again soon!  If you have any questions about your visit today, please call us at [phone] or feel free to reach your primary care provider via [place].

## 2019-09-03 ENCOUNTER — Other Ambulatory Visit: Payer: Self-pay

## 2019-09-03 ENCOUNTER — Encounter: Payer: Self-pay | Admitting: Internal Medicine

## 2019-09-03 ENCOUNTER — Ambulatory Visit (INDEPENDENT_AMBULATORY_CARE_PROVIDER_SITE_OTHER): Payer: Self-pay | Admitting: Internal Medicine

## 2019-09-03 VITALS — BP 135/95 | HR 111 | Temp 97.5°F | Resp 17 | Ht 75.0 in | Wt 196.0 lb

## 2019-09-03 DIAGNOSIS — I10 Essential (primary) hypertension: Secondary | ICD-10-CM

## 2019-09-03 DIAGNOSIS — Z7689 Persons encountering health services in other specified circumstances: Secondary | ICD-10-CM

## 2019-09-03 DIAGNOSIS — I5022 Chronic systolic (congestive) heart failure: Secondary | ICD-10-CM

## 2019-09-03 MED FILL — SPIRONOLACTONE 25 MG TABS: 25 | 30 days supply | Qty: 15 | Fill #1

## 2019-09-03 MED FILL — CARVEDILOL 6.25 MG TABLET: 6.25 | 30 days supply | Qty: 60 | Fill #1

## 2019-09-03 NOTE — Progress Notes (Signed)
  Subjective:    [name] [name] - 49 y.o. male MRN [id number]  Date of birth: 26-Apr-1971  HPI  [name] [name] is to establish care. Patient has a PMH significant for CHF, HTN, HLD, depression, h/o polysubstance abuse, and mitral valve regurgitation.   Patient reports he weighs himself at home and weight stays consistent. No concerns about fluid retention. Has Lasix prescribed prn. Has not needed to take recently.   Chronic HTN Disease Monitoring:  Home BP Monitoring - Does not monitor.  Chest pain- no  Dyspnea- no Headache - no  Medications: Entrestro 97-103, Spironolactone 25 mg, Coreg 12.5 mg BID  Compliance- yes Lightheadedness- no  Edema- no    ROS per HPI     Health Maintenance:  Health Maintenance Due  Topic Date Due  . TETANUS/TDAP  05/10/2019     Past Medical History: Patient Active Problem List   Diagnosis Date Noted  . Chronic systolic CHF (congestive heart failure) ([place]) 01/23/2017  . Non-ischemic cardiomyopathy ([place]) 09/19/2016  . HLD (hyperlipidemia) 09/19/2016  . ETOH abuse 09/19/2016  . Moderate mitral regurgitation 09/05/2016  . Cocaine abuse ([place]) 08/23/2016  . Positive D dimer 08/23/2016  . Macrocytosis 08/23/2016  . Alcohol use 08/20/2016  . 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 Essential hypertension 04/20/2008      Social History   reports that he has never smoked. He has never used smokeless tobacco. He reports current alcohol use. He reports current drug use. Drug: Marijuana.   Family History  family history includes Diabetes in an other family member; Hypertension in an other family member.   Medications: reviewed and updated   Objective:   Physical Exam BP (!) 143/95   Pulse (!) 111   Temp (!) 97.5 F (36.4 C) (Temporal)   Resp 17   Ht 6\' 3"  (1.905 m)   Wt 196 lb (88.9 kg)   SpO2 96%   BMI 24.50 kg/m  Physical Exam   Constitutional: He is oriented to person, place, and time and well-developed, well-nourished, and in no distress. No distress.  HENT:  Head: Normocephalic and atraumatic.  Eyes: Conjunctivae and EOM are normal.  Cardiovascular: Normal rate, regular rhythm and normal heart sounds.  No murmur heard. No LE edema.   Pulmonary/Chest: Effort normal and breath sounds normal. No respiratory distress.  No crackles present.   Musculoskeletal:        General: Normal range of motion.  Neurological: He is alert and oriented to person, place, and time.  Skin: Skin is warm and dry. He is not diaphoretic.  Psychiatric: Affect and judgment normal.        Assessment & Plan:   1. Encounter to establish care Return for annual exam.   2. Essential hypertension BP elevated, improved on repeat to 135/95. Will not adjust medications given followed by cardiology, no red flag symptoms, and BP not significantly elevated. Could consider increase in B-blocker in future given pulse appears it would be able to tolerate that.   3. Chronic systolic CHF (congestive heart failure) (HCC) Last echo showed EF 70-75% with hyperdynamic LV. No signs of volume overload at today's appointment. Patient reports home weights have been stable. Continue current regimen. Managed by cardiology.      Phill Myron, D.O. 09/03/2019, 10:34 AM Primary Care at [facility]

## 2019-10-07 ENCOUNTER — Ambulatory Visit: Payer: Self-pay | Attending: Family Medicine

## 2019-10-07 ENCOUNTER — Other Ambulatory Visit: Payer: Self-pay

## 2019-10-14 ENCOUNTER — Telehealth (HOSPITAL_COMMUNITY): Payer: Self-pay | Admitting: Pharmacy Technician

## 2019-10-14 NOTE — Telephone Encounter (Signed)
Called patient in reference to [employer] application. Need patient signature in order to send in. No way to leave a message, voicemail was full.  Will follow up.

## 2019-10-22 NOTE — Telephone Encounter (Signed)
Spoke with patient, he stated he spoke with someone once already regarding his application. I do not see any notes regarding assistance so we will send it in again. He is going to fax over his portion of the application. I will get the prescribers portion filled out once received.  Will follow up.

## 2019-10-23 MED FILL — SPIRONOLACTONE 25 MG TABS: 25 | 30 days supply | Qty: 15 | Fill #3

## 2019-11-04 ENCOUNTER — Telehealth: Payer: Self-pay

## 2019-11-04 NOTE — Telephone Encounter (Signed)
Called patient to do their pre-visit COVID screening.  Someone answered the phone, stated that patient was unavailable & then hung up.

## 2019-11-04 NOTE — Patient Instructions (Addendum)
Keeping you healthy  Get these tests  Blood pressure- Have your blood pressure checked once a year by your healthcare provider.  Normal blood pressure is 120/80  Weight- Have your body mass index (BMI) calculated to screen for obesity.  BMI is a measure of body fat based on height and weight. You can also calculate your own BMI at ViewBanking.si.  Cholesterol- Have your cholesterol checked every year.  Diabetes- Have your blood sugar checked regularly if you have high blood pressure, high cholesterol, have a family history of diabetes or if you are overweight.  Screening for Colon Cancer- Colonoscopy starting at age 34.  Screening may begin sooner depending on your family history and other health conditions. Follow up colonoscopy as directed by your Gastroenterologist.  Screening for Prostate Cancer- Both blood work (PSA) and a rectal exam help screen for Prostate Cancer.  Screening begins at age 75 with African-American men and at age 30 with Caucasian men.  Screening may begin sooner depending on your family history.  Take these medicines  Aspirin- One aspirin daily can help prevent Heart disease and Stroke.  Flu shot- Every fall.  Tetanus- Every 10 years.  Zostavax- Once after the age of 40 to prevent Shingles.  Pneumonia shot- Once after the age of 59; if you are younger than 10, ask your healthcare provider if you need a Pneumonia shot.  Take these steps  Don't smoke- If you do smoke, talk to your doctor about quitting.  For tips on how to quit, go to www.smokefree.gov or call 1-800-QUIT-NOW.  Be physically active- Exercise 5 days a week for at least 30 minutes.  If you are not already physically active start slow and gradually work up to 30 minutes of moderate physical activity.  Examples of moderate activity include walking briskly, mowing the yard, dancing, swimming, bicycling, etc.  Eat a healthy diet- Eat a variety of healthy food such as fruits, vegetables, low  fat milk, low fat cheese, yogurt, lean meant, poultry, fish, beans, tofu, etc. For more information go to www.thenutritionsource.org  Drink alcohol in moderation- Limit alcohol intake to less than two drinks a day. Never drink and drive.  Dentist- Brush and floss twice daily; visit your dentist twice a year.  Depression- Your emotional health is as important as your physical health. If you're feeling down, or losing interest in things you would normally enjoy please talk to your healthcare provider.  Eye exam- Visit your eye doctor every year.  Safe sex- If you may be exposed to a sexually transmitted infection, use a condom.  Seat belts- Seat belts can save your life; always wear one.  Smoke/Carbon Monoxide detectors- These detectors need to be installed on the appropriate level of your home.  Replace batteries at least once a year.  Skin cancer- When out in the sun, cover up and use sunscreen 15 SPF or higher.  Violence- If anyone is threatening you, please tell your healthcare provider. Living Will/ Health care power of attorney  Benign Prostatic Hyperplasia  Benign prostatic hyperplasia (BPH) is an enlarged prostate gland that is caused by the normal aging process and not by cancer. The prostate is a walnut-sized gland that is involved in the production of semen. It is located in front of the rectum and below the bladder. The bladder stores urine and the urethra is the tube that carries the urine out of the body. The prostate may get bigger as a man gets older. An enlarged prostate can press on the  urethra. This can make it harder to pass urine. The build-up of urine in the bladder can cause infection. Back pressure and infection may progress to bladder damage and kidney (renal) failure. What are the causes? This condition is part of a normal aging process. However, not all men develop problems from this condition. If the prostate enlarges away from the urethra, urine flow will not be  blocked. If it enlarges toward the urethra and compresses it, there will be problems passing urine. What increases the risk? This condition is more likely to develop in men over the age of 48 years. What are the signs or symptoms? Symptoms of this condition include: Getting up often during the night to urinate. Needing to urinate frequently during the day. Difficulty starting urine flow. Decrease in size and strength of your urine stream. Leaking (dribbling) after urinating. Inability to pass urine. This needs immediate treatment. Inability to completely empty your bladder. Pain when you pass urine. This is more common if there is also an infection. Urinary tract infection (UTI). How is this diagnosed? This condition is diagnosed based on your medical history, a physical exam, and your symptoms. Tests will also be done, such as: A post-void bladder scan. This measures any amount of urine that may remain in your bladder after you finish urinating. A digital rectal exam. In a rectal exam, your health care provider checks your prostate by putting a lubricated, gloved finger into your rectum to feel the back of your prostate gland. This exam detects the size of your gland and any abnormal lumps or growths. An exam of your urine (urinalysis). A prostate specific antigen (PSA) screening. This is a blood test used to screen for prostate cancer. An ultrasound. This test uses sound waves to electronically produce a picture of your prostate gland. Your health care provider may refer you to a specialist in kidney and prostate diseases (urologist). How is this treated? Once symptoms begin, your health care provider will monitor your condition (active surveillance or watchful waiting). Treatment for this condition will depend on the severity of your condition. Treatment may include: Observation and yearly exams. This may be the only treatment needed if your condition and symptoms are mild. Medicines to  relieve your symptoms, including: Medicines to shrink the prostate. Medicines to relax the muscle of the prostate. Surgery in severe cases. Surgery may include: Prostatectomy. In this procedure, the prostate tissue is removed completely through an open incision or with a laparoscope or robotics. Transurethral resection of the prostate (TURP). In this procedure, a tool is inserted through the opening at the tip of the penis (urethra). It is used to cut away tissue of the inner core of the prostate. The pieces are removed through the same opening of the penis. This removes the blockage. Transurethral incision (TUIP). In this procedure, small cuts are made in the prostate. This lessens the prostate's pressure on the urethra. Transurethral microwave thermotherapy (TUMT). This procedure uses microwaves to create heat. The heat destroys and removes a small amount of prostate tissue. Transurethral needle ablation (TUNA). This procedure uses radio frequencies to destroy and remove a small amount of prostate tissue. Interstitial laser coagulation ([place]). This procedure uses a laser to destroy and remove a small amount of prostate tissue. Transurethral electrovaporization (TUVP). This procedure uses electrodes to destroy and remove a small amount of prostate tissue. Prostatic urethral lift. This procedure inserts an implant to push the lobes of the prostate away from the urethra. Follow these instructions at home:  Take over-the-counter and prescription medicines only as told by your health care provider. Monitor your symptoms for any changes. Contact your health care provider with any changes. Avoid drinking large amounts of liquid before going to bed or out in public. Avoid or reduce how much caffeine or alcohol you drink. Give yourself time when you urinate. Keep all follow-up visits as told by your health care provider. This is important. Contact a health care provider if: You have unexplained back  pain. Your symptoms do not get better with treatment. You develop side effects from the medicine you are taking. Your urine becomes very dark or has a bad smell. Your lower abdomen becomes distended and you have trouble passing your urine. Get help right away if: You have a fever or chills. You suddenly cannot urinate. You feel lightheaded, or very dizzy, or you faint. There are large amounts of blood or clots in the urine. Your urinary problems become hard to manage. You develop moderate to severe low back or flank pain. The flank is the side of your body between the ribs and the hip. These symptoms may represent a serious problem that is an emergency. Do not wait to see if the symptoms will go away. Get medical help right away. Call your local emergency services (911 in the [place] Do not drive yourself to the hospital. Summary Benign prostatic hyperplasia (BPH) is an enlarged prostate that is caused by the normal aging process and not by cancer. An enlarged prostate can press on the urethra. This can make it hard to pass urine. This condition is part of a normal aging process and is more likely to develop in men over the age of 24 years. Get help right away if you suddenly cannot urinate. This information is not intended to replace advice given to you by your health care provider. Make sure you discuss any questions you have with your health care provider. Document Revised: 04/01/2018 Document Reviewed: 06/11/2016 Elsevier Patient Education  2020 [place] with your healthcare provider and family.

## 2019-11-04 NOTE — Telephone Encounter (Signed)
Spoke with patient, he is going to call me when he faxes the application over.

## 2019-11-05 ENCOUNTER — Encounter: Payer: Self-pay | Admitting: Internal Medicine

## 2019-11-05 ENCOUNTER — Other Ambulatory Visit: Payer: Self-pay

## 2019-11-05 ENCOUNTER — Ambulatory Visit (INDEPENDENT_AMBULATORY_CARE_PROVIDER_SITE_OTHER): Payer: Self-pay | Admitting: Internal Medicine

## 2019-11-05 VITALS — BP 135/87 | HR 98 | Temp 97.2°F | Resp 17 | Ht 75.0 in | Wt 194.0 lb

## 2019-11-05 DIAGNOSIS — Z0000 Encounter for general adult medical examination without abnormal findings: Secondary | ICD-10-CM

## 2019-11-05 DIAGNOSIS — R399 Unspecified symptoms and signs involving the genitourinary system: Secondary | ICD-10-CM

## 2019-11-05 DIAGNOSIS — Z125 Encounter for screening for malignant neoplasm of prostate: Secondary | ICD-10-CM

## 2019-11-05 DIAGNOSIS — Z5181 Encounter for therapeutic drug level monitoring: Secondary | ICD-10-CM

## 2019-11-05 DIAGNOSIS — Z113 Encounter for screening for infections with a predominantly sexual mode of transmission: Secondary | ICD-10-CM

## 2019-11-05 NOTE — Progress Notes (Signed)
Subjective:    [name] [name] - 49 y.o. male MRN [id number]  Date of birth: 11/23/1970  HPI  [name] [name] is here for annual exam.   Patient has concerns with urination. Reports that for several months he has had increased nighttime urination, awakening at least 3x per night to void. He also has difficulty with stream stopping and starting and feels like he has a weak urine stream. Has to push to initiate urine. No burning with urination. No incontinence. No rectal pressure or pain.    BPH symptom score of 19: moderate-to severe.    Health Maintenance:  Health Maintenance Due  Topic Date Due  . TETANUS/TDAP  05/10/2019    -  reports that he has never smoked. He has never used smokeless tobacco. - Review of Systems: Per HPI. - Past Medical History: Patient Active Problem List   Diagnosis Date Noted  . Chronic systolic CHF (congestive heart failure) ([place]) 01/23/2017  . Non-ischemic cardiomyopathy ([place]) 09/19/2016  . HLD (hyperlipidemia) 09/19/2016  . ETOH abuse 09/19/2016  . Moderate mitral regurgitation 09/05/2016  . Cocaine abuse ([place]) 08/23/2016  . Positive D dimer 08/23/2016  . Macrocytosis 08/23/2016  . Alcohol use 08/20/2016  . Major depressive disorder, recurrent severe without psychotic features ([place]) 08/13/2014  . 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 Essential hypertension 04/20/2008   - Medications: reviewed and updated   Objective:   Physical Exam BP 135/87   Pulse 98   Temp (!) 97.2 F (36.2 C) (Temporal)   Resp 17   Ht 6\' 3"  (1.905 m)   Wt 88 kg   SpO2 96%   BMI 24.25 kg/m  Physical Exam Constitutional:      Appearance: He is not diaphoretic.  HENT:     Head: Normocephalic and atraumatic.  Eyes:     Conjunctiva/sclera: Conjunctivae normal.     Pupils: Pupils are equal, round, and reactive to light.  Neck:     Thyroid: No thyromegaly.  Cardiovascular:     Rate and Rhythm: Normal rate and regular rhythm.     Heart sounds: Normal heart  sounds. No murmur heard.   Pulmonary:     Effort: Pulmonary effort is normal. No respiratory distress.     Breath sounds: Normal breath sounds. No wheezing.  Abdominal:     General: Bowel sounds are normal. There is no distension.     Palpations: Abdomen is soft.     Tenderness: There is no abdominal tenderness. There is no guarding or rebound.  Genitourinary:    Comments: DRE deferred.  Musculoskeletal:        General: No deformity. Normal range of motion.     Cervical back: Normal range of motion and neck supple.  Lymphadenopathy:     Cervical: No cervical adenopathy.  Skin:    General: Skin is warm and dry.     Findings: No rash.  Neurological:     Mental Status: He is alert and oriented to person, place, and time.     Gait: Gait is intact.  Psychiatric:        Mood and Affect: Mood and affect normal.        Judgment: Judgment normal.            Assessment & Plan:   1. Annual physical exam Counseled on 150 minutes of exercise per week, healthy eating (including decreased daily intake of saturated fats, cholesterol, added sugars, sodium), STI prevention, routine healthcare maintenance. - CBC with Differential - Comprehensive metabolic  panel - Lipid Panel  2. Screening for STDs (sexually transmitted diseases) - GC/Chlamydia Probe Amp([facility]) - HIV Antibody (routine testing w rflx) - RPR  3. Screening PSA (prostate specific antigen) - PSA  4. Encounter for medication monitoring Given use of Lasix, Entresto, and Spironolactone warrants laboratory monitoring.  - Comprehensive metabolic panel  5. Lower urinary tract symptoms (LUTS) Most concerned for BPH. Will obtain UA and PSA to ensure no other abnormalities that could be contributing to his symptoms. Also screening for GC/Chlamydia for completeness. He scored at the top of moderate category for symptoms concerning for BPH. If all labs normal, will plan to start Flomax.  - PSA - Urinalysis, Routine w reflex  microscopic      Phill Myron, D.O. 11/05/2019, 10:28 AM Primary Care at [facility]

## 2019-11-05 NOTE — Telephone Encounter (Signed)
Patient was in office today for his annual physical exam with PCP. He had his medication assistance application with him. We have sent this to you via interoffice mail today.

## 2019-11-06 LAB — URINALYSIS, ROUTINE W REFLEX MICROSCOPIC
Bilirubin, UA: NEGATIVE
Glucose, UA: NEGATIVE
Leukocytes,UA: NEGATIVE
Nitrite, UA: NEGATIVE
Protein,UA: NEGATIVE
RBC, UA: NEGATIVE
Specific Gravity, UA: 1.017 (ref 1.005–1.030)
Urobilinogen, Ur: 0.2 mg/dL (ref 0.2–1.0)
pH, UA: 5.5 (ref 5.0–7.5)

## 2019-11-06 LAB — CBC WITH DIFFERENTIAL/PLATELET
Basophils Absolute: 0 10*3/uL (ref 0.0–0.2)
Basos: 1 %
EOS (ABSOLUTE): 0.2 10*3/uL (ref 0.0–0.4)
Eos: 3 %
Hematocrit: 41.2 % (ref 37.5–51.0)
Hemoglobin: 14.6 g/dL (ref 13.0–17.7)
Immature Grans (Abs): 0 10*3/uL (ref 0.0–0.1)
Immature Granulocytes: 0 %
Lymphocytes Absolute: 2.1 10*3/uL (ref 0.7–3.1)
Lymphs: 39 %
MCH: 35.7 pg — ABNORMAL HIGH (ref 26.6–33.0)
MCHC: 35.4 g/dL (ref 31.5–35.7)
MCV: 101 fL — ABNORMAL HIGH (ref 79–97)
Monocytes Absolute: 0.5 10*3/uL (ref 0.1–0.9)
Monocytes: 10 %
Neutrophils Absolute: 2.6 10*3/uL (ref 1.4–7.0)
Neutrophils: 47 %
Platelets: 261 10*3/uL (ref 150–450)
RBC: 4.09 x10E6/uL — ABNORMAL LOW (ref 4.14–5.80)
RDW: 13 % (ref 11.6–15.4)
WBC: 5.4 10*3/uL (ref 3.4–10.8)

## 2019-11-06 LAB — COMPREHENSIVE METABOLIC PANEL
ALT: 56 IU/L — ABNORMAL HIGH (ref 0–44)
AST: 178 IU/L — ABNORMAL HIGH (ref 0–40)
Albumin/Globulin Ratio: 1 — ABNORMAL LOW (ref 1.2–2.2)
Albumin: 4.4 g/dL (ref 4.0–5.0)
Alkaline Phosphatase: 108 IU/L (ref 48–121)
BUN/Creatinine Ratio: 11 (ref 9–20)
BUN: 8 mg/dL (ref 6–24)
Bilirubin Total: 0.5 mg/dL (ref 0.0–1.2)
CO2: 21 mmol/L (ref 20–29)
Calcium: 9.6 mg/dL (ref 8.7–10.2)
Chloride: 94 mmol/L — ABNORMAL LOW (ref 96–106)
Creatinine, Ser: 0.71 mg/dL — ABNORMAL LOW (ref 0.76–1.27)
GFR calc Af Amer: 126 mL/min/{1.73_m2} (ref >59)
GFR calc non Af Amer: 109 mL/min/{1.73_m2} (ref >59)
Globulin, Total: 4.5 g/dL (ref 1.5–4.5)
Glucose: 84 mg/dL (ref 65–99)
Potassium: 4.3 mmol/L (ref 3.5–5.2)
Sodium: 136 mmol/L (ref 134–144)
Total Protein: 8.9 g/dL — ABNORMAL HIGH (ref 6.0–8.5)

## 2019-11-06 LAB — PSA: Prostate Specific Ag, Serum: 0.7 ng/mL (ref 0.0–4.0)

## 2019-11-06 LAB — RPR: RPR Ser Ql: NONREACTIVE

## 2019-11-06 LAB — LIPID PANEL
Chol/HDL Ratio: 2.3 ratio (ref 0.0–5.0)
Cholesterol, Total: 186 mg/dL (ref 100–199)
HDL: 80 mg/dL (ref >39)
LDL Chol Calc (NIH): 88 mg/dL (ref 0–99)
Triglycerides: 101 mg/dL (ref 0–149)
VLDL Cholesterol Cal: 18 mg/dL (ref 5–40)

## 2019-11-06 LAB — HIV ANTIBODY (ROUTINE TESTING W REFLEX): HIV Screen 4th Generation wRfx: NONREACTIVE

## 2019-11-07 LAB — GC/CHLAMYDIA PROBE AMP
Chlamydia trachomatis, NAA: NEGATIVE
Neisseria Gonorrhoeae by PCR: NEGATIVE

## 2019-11-09 ENCOUNTER — Other Ambulatory Visit: Payer: Self-pay | Admitting: Internal Medicine

## 2019-11-09 DIAGNOSIS — R748 Abnormal levels of other serum enzymes: Secondary | ICD-10-CM

## 2019-11-09 DIAGNOSIS — R351 Nocturia: Secondary | ICD-10-CM

## 2019-11-09 DIAGNOSIS — N401 Enlarged prostate with lower urinary tract symptoms: Secondary | ICD-10-CM | POA: Insufficient documentation

## 2019-11-09 MED ORDER — TAMSULOSIN HCL 0.4 MG PO CAPS: 0.4000 mg | ORAL_CAPSULE | Freq: Every day | ORAL | 3 refills | Status: DC

## 2019-11-13 ENCOUNTER — Encounter (HOSPITAL_COMMUNITY): Payer: Self-pay | Admitting: Cardiology

## 2019-11-13 ENCOUNTER — Other Ambulatory Visit: Payer: Self-pay

## 2019-11-13 ENCOUNTER — Ambulatory Visit (HOSPITAL_COMMUNITY)
Admission: RE | Admit: 2019-11-13 | Discharge: 2019-11-13 | Disposition: A | Discharge status: Home / Self Care | Payer: Medicaid Other | Source: Ambulatory Visit | Attending: Cardiology | Admitting: Cardiology

## 2019-11-13 VITALS — BP 128/82 | HR 107 | Wt 198.0 lb

## 2019-11-13 DIAGNOSIS — F1010 Alcohol abuse, uncomplicated: Secondary | ICD-10-CM | POA: Insufficient documentation

## 2019-11-13 DIAGNOSIS — F1410 Cocaine abuse, uncomplicated: Secondary | ICD-10-CM | POA: Insufficient documentation

## 2019-11-13 DIAGNOSIS — I5022 Chronic systolic (congestive) heart failure: Secondary | ICD-10-CM | POA: Diagnosis present

## 2019-11-13 DIAGNOSIS — I428 Other cardiomyopathies: Secondary | ICD-10-CM

## 2019-11-13 DIAGNOSIS — Z79899 Other long term (current) drug therapy: Secondary | ICD-10-CM | POA: Diagnosis not present

## 2019-11-13 DIAGNOSIS — I110 Hypertensive heart disease with heart failure: Secondary | ICD-10-CM | POA: Insufficient documentation

## 2019-11-13 DIAGNOSIS — E785 Hyperlipidemia, unspecified: Secondary | ICD-10-CM | POA: Diagnosis not present

## 2019-11-13 DIAGNOSIS — Z8249 Family history of ischemic heart disease and other diseases of the circulatory system: Secondary | ICD-10-CM | POA: Diagnosis not present

## 2019-11-13 DIAGNOSIS — F1911 Other psychoactive substance abuse, in remission: Secondary | ICD-10-CM | POA: Diagnosis not present

## 2019-11-13 MED ORDER — ENTRESTO 97-103 MG PO TABS: 1.0000 | ORAL_TABLET | Freq: Two times a day (BID) | ORAL | 3 refills | Status: DC

## 2019-11-13 NOTE — Patient Instructions (Signed)
No labs done today.   RESTART Entresto 97-103mg (1 tablet) by mouth 2 times daily.  No other medication changes were made. Please continue all other current medications as prescribed.  Your physician recommends that you schedule a follow-up appointment in: 1 year with our app clinic(here in our office). Our office will contact you at a later date to schedule an appointment.  If you have any questions or concerns before your next appointment please send [place] a message through [place] or call our office at [phone].    TO LEAVE A MESSAGE FOR THE NURSE SELECT OPTION 2, PLEASE LEAVE A MESSAGE INCLUDING: . YOUR NAME . DATE OF BIRTH . CALL BACK NUMBER . REASON FOR CALL**this is important as we prioritize the call backs  [place] AS LONG AS YOU CALL BEFORE 4:00 PM   At the [facility], you and your health needs are our priority. As part of our continuing mission to provide you with exceptional heart care, we have created designated Provider Care Teams. These Care Teams include your primary Cardiologist (physician) and Advanced Practice Providers (APPs- Physician Assistants and Nurse Practitioners) who all work together to provide you with the care you need, when you need it.   You may see any of the following providers on your designated Care Team at your next follow up: Marland Kitchen Dr Glori Bickers . Dr Loralie Champagne . Darrick Grinder, NP . Lyda Jester, PA . Audry Riles, PharmD   Please be sure to bring in all your medications bottles to every appointment.

## 2019-11-13 NOTE — Telephone Encounter (Signed)
Sent application to [employer]. Will follow up.

## 2019-11-14 NOTE — Progress Notes (Signed)
Advanced Heart Failure Clinic Note   HF Cardiology: Dr Aundra Dubin  HPI:  [name] [name] is a 49 y.o. male with HTN, HLD, Systolic CHF, and history of polysubstance abuse (Cocaine, THC, and ETOH)  Presented to [facility] 08/20/16 with worsening dyspnea and CP.  He was diagnosed with systolic CHF with EF 63% on echo. Cath as below with normal coronaries and preserved cardiac output. Initial coox 50.5% so transiently placed on milrinone 0.125 mcg/kg/min. Pt tolerated wean with diuresis and adjustment of medications.   Echo 02/2017: EF up to 50% with mild diffuse hypokinesis.  Echo in 3/21 showed EF up to 70-75%, hyperdynamic.   He presents today followup of CHF.  Generally doing well.  He is short of breath walking long distances, mild dyspnea with stairs.  No smoking or ETOH use. No chest pain.  No lightheadedness.  He is out of [place].   ECG (personally reviewed): NSR, normal  Labs (7/18): digoxin 0.7 Labs (9/18): K 3.9, creatinine 0.88, BNP 20 Labs (5/19): K 4.1, creatinine 0.93, TSH normal, hgb 11.7 Labs (6/21): K 4.3, creatinine 0.71, LDL 88, TSH normal  Review of systems complete and found to be negative unless listed in HPI.    Past medical History 1. Chronic systolic CHF: Nonischemic cardiomyopathy, heavy ETOH use versus long-standing HTN. New diagnosis 08/2016 . HIV negative, SPEP negative.  - LHC (4/18) with no CAD. - Echo (4/18): LVEF 15%, Mod MR, Mod LAE, RV mild reduced, Mild RAE.  - RHC (4/18): mean RA 1, PA 22/11, mean PCWP 15, CI 2.7 (on milrinone 0.125) - NICM work up ongoing.  ? Etiology from heavy ETOH use of long standing HTN with LVH on EKG - Echo (10/18): EF 50%, diffuse hypokinesis, normal RV size and systolic function, small IVC. - Echo (3/21): EF 70-75%, hyperdynamic  2. HTN:  Long standing and poorly controlled. LVH on EKG 3. Hyperlipidemia 4. Polysubstance abuse: h/o cocaine and marijuana.  5. MR: Moderate by 4/18 echo, likely functional.  6. ETOH abuse: Has cut  back.  7. Sleep study 12/18 negative  Current Outpatient Medications  Medication Sig Dispense Refill  . carvedilol (COREG) 12.5 MG tablet Take 1 tablet (12.5 mg total) by mouth 2 (two) times daily. 60 tablet 3  . spironolactone (ALDACTONE) 25 MG tablet Take 1 tablet (25 mg total) by mouth daily. 30 tablet 3  . furosemide (LASIX) 20 MG tablet Take 1 tablet (20 mg total) by mouth daily as needed. For weight gain >3 lb in 1 day or >5 lb in 1 week. (Patient not taking: Reported on 11/13/2019) 30 tablet 2  . sacubitril-valsartan (ENTRESTO) 97-103 MG Take 1 tablet by mouth 2 (two) times daily. 180 tablet 3  . tamsulosin (FLOMAX) 0.4 MG CAPS capsule Take 1 capsule (0.4 mg total) by mouth daily. (Patient not taking: Reported on 11/13/2019) 30 capsule 3   No current facility-administered medications for this encounter.    No Known Allergies    Social History   Socioeconomic History  . Marital status: Single    Spouse name: Not on file  . Number of children: Not on file  . Years of education: Not on file  . Highest education level: Not on file  Occupational History  . Not on file  Tobacco Use  . Smoking status: Never Smoker  . Smokeless tobacco: Never Used  Substance and Sexual Activity  . Alcohol use: Yes    Comment: (2) 24 oz beers every other day pt reports  . Drug  use: Yes    Types: Marijuana    Comment: pt reports marijuana use occasionally  . Sexual activity: Yes    Birth control/protection: None  Other Topics Concern  . Not on file  Social History Narrative  . Not on file   Social Determinants of Health   Financial Resource Strain:   . Difficulty of Paying Living Expenses:   Food Insecurity:   . Worried About Charity fundraiser in the Last Year:   . Arboriculturist in the Last Year:   Transportation Needs:   . Film/video editor (Medical):   Marland Kitchen Lack of Transportation (Non-Medical):   Physical Activity:   . Days of Exercise per Week:   . Minutes of Exercise per  Session:   Stress:   . Feeling of Stress :   Social Connections:   . Frequency of Communication with Friends and Family:   . Frequency of Social Gatherings with Friends and Family:   . Attends Religious Services:   . Active Member of Clubs or Organizations:   . Attends Archivist Meetings:   Marland Kitchen Marital Status:   Intimate Partner Violence:   . Fear of Current or Ex-Partner:   . Emotionally Abused:   Marland Kitchen Physically Abused:   . Sexually Abused:       Family History  Problem Relation Age of Onset  . Diabetes Other   . Hypertension Other     Vitals:   11/13/19 1122  BP: 128/82  Pulse: (!) 107  SpO2: 96%  Weight: 89.8 kg (198 lb)   Wt Readings from Last 3 Encounters:  11/13/19 89.8 kg (198 lb)  11/05/19 88 kg (194 lb)  09/03/19 88.9 kg (196 lb)    PHYSICAL EXAM:  General: NAD Neck: No JVD, no thyromegaly or thyroid nodule.  Lungs: Clear to auscultation bilaterally with normal respiratory effort. CV: Nondisplaced PMI.  Heart regular S1/S2, no S3/S4, no murmur.  No peripheral edema.  No carotid bruit.  Normal pedal pulses.  Abdomen: Soft, nontender, no hepatosplenomegaly, no distention.  Skin: Intact without lesions or rashes.  Neurologic: Alert and oriented x 3.  Psych: Normal affect. Extremities: No clubbing or cyanosis.  HEENT: Normal.   ASSESSMENT & PLAN: 1. Chronic systolic CHF: Nonischemic cardiomyopathy.  Possibly due to ETOH abuse versus long-standing HTN.  Echo (4/18) LVEF 15%, Mod MR, Mod LAE, RV mild reduced, Mild RAE.  Cath with no CAD.  Echo 02/2017 EF up to 50% with cessation of ETOH and treatment of HTN.  Echo in 3/21 with EF up to 70-75%, hyperdynamic.  Still with NYHA class II symptoms but not volume overloaded on exam.   - Continue Coreg 6.25 mg BID.  - Refill Entresto 97/103 mg BID. Recent BMET today.  - Continue Spiro 25 mg daily. 2. HTN: BP stable on current regimen.  3. Polysubstance abuse: Tox panel + for THC and Cocaine on admission  08/2016. - He has quit using drugs.  4. ETOH abuse:  No longer drinking ETOH.   Followup 1 year.    Loralie Champagne 11/14/2019

## 2019-11-16 NOTE — Telephone Encounter (Signed)
Advanced Heart Failure Patient Advocate Encounter   Patient was approved to receive Entresto from [employer].  Patient ID: [id number]  Effective dates: 11/16/19 through 11/15/20.  Called and spoke with patient.  Charlann Boxer

## 2019-11-30 MED FILL — SPIRONOLACTONE 25 MG TABS: 25 | 30 days supply | Qty: 15 | Fill #4

## 2019-11-30 MED FILL — CARVEDILOL 6.25 MG TABLET: 6.25 | 30 days supply | Qty: 60 | Fill #2

## 2019-12-10 ENCOUNTER — Other Ambulatory Visit: Payer: Self-pay

## 2019-12-10 DIAGNOSIS — R351 Nocturia: Secondary | ICD-10-CM

## 2019-12-10 DIAGNOSIS — N401 Enlarged prostate with lower urinary tract symptoms: Secondary | ICD-10-CM

## 2019-12-10 MED ORDER — TAMSULOSIN HCL 0.4 MG PO CAPS: 0.4000 mg | ORAL_CAPSULE | Freq: Every day | ORAL | 3 refills | Status: DC

## 2019-12-10 MED FILL — TAMSULOSIN HCL 0.4 MG CAP: 0.4 | 30 days supply | Qty: 30 | Fill #0

## 2019-12-11 ENCOUNTER — Ambulatory Visit: Payer: Self-pay | Attending: Internal Medicine

## 2019-12-11 ENCOUNTER — Other Ambulatory Visit: Payer: Self-pay

## 2019-12-21 MED FILL — SPIRONOLACTONE 25 MG TABS: 25 | 30 days supply | Qty: 15 | Fill #5

## 2019-12-22 ENCOUNTER — Telehealth: Payer: Self-pay | Admitting: Internal Medicine

## 2019-12-22 NOTE — Telephone Encounter (Signed)
Pt was sent a letter from financial dept. Inform them, that the application they submitted was incomplete, since they were missing some documentation at the time of the appointment, Pt need to reschedule and resubmit all new papers and application for CAFA and [place], P.S. old documents has been sent back by mail to the Pt and Pt. need to make a new appt. 

## 2020-01-11 ENCOUNTER — Other Ambulatory Visit (HOSPITAL_COMMUNITY): Payer: Self-pay | Admitting: Adult Health

## 2020-01-11 ENCOUNTER — Other Ambulatory Visit (HOSPITAL_COMMUNITY): Payer: Self-pay | Admitting: Cardiology

## 2020-01-11 MED FILL — TAMSULOSIN HCL 0.4 MG CAP: 0.4 | 30 days supply | Qty: 30 | Fill #1

## 2020-01-11 MED FILL — SPIRONOLACTONE 25 MG TABS: 25 | 30 days supply | Qty: 15 | Fill #0

## 2020-02-03 MED FILL — SPIRONOLACTONE 25 MG TABS: 25 | 30 days supply | Qty: 15 | Fill #1

## 2020-02-03 MED FILL — CARVEDILOL 6.25 MG TABLET: 6.25 | 30 days supply | Qty: 60 | Fill #3

## 2020-02-22 MED FILL — TAMSULOSIN HCL 0.4 MG CAP: 0.4 | 30 days supply | Qty: 30 | Fill #2

## 2020-02-22 MED FILL — SPIRONOLACTONE 25 MG TABS: 25 | 30 days supply | Qty: 15 | Fill #2

## 2020-03-28 MED FILL — SPIRONOLACTONE 25 MG TABS: 25 | 30 days supply | Qty: 15 | Fill #3

## 2020-03-28 MED FILL — TAMSULOSIN HCL 0.4 MG CAP: 0.4 | 30 days supply | Qty: 30 | Fill #3

## 2020-04-18 MED FILL — CARVEDILOL 6.25 MG TABLET: 6.25 | 30 days supply | Qty: 60 | Fill #4

## 2020-04-19 MED FILL — SPIRONOLACTONE 25 MG TABS: 25 | 30 days supply | Qty: 15 | Fill #4

## 2020-05-16 ENCOUNTER — Other Ambulatory Visit: Payer: Self-pay | Admitting: Internal Medicine

## 2020-05-16 DIAGNOSIS — R351 Nocturia: Secondary | ICD-10-CM

## 2020-05-17 MED FILL — SPIRONOLACTONE 25 MG TABS: 25 | 30 days supply | Qty: 15 | Fill #5

## 2020-05-19 NOTE — Progress Notes (Addendum)
Patient ID: [name] [name], male    DOB: [birth year]  MRN: [id number]  CC: BPH Follow-Up  Subjective: [name] [name] is a 49 y.o. male who presents for BPH follow-up.   1. BPH FOLLOW-UP: BPH status: stable Satisfied with current treatment?: yes Medication side effects: no Medication compliance: good compliance Duration: chronic Nocturia: no Urinary frequency:no Incomplete voiding: yes Urgency: no Weak urinary stream: yes Straining to start stream: yes Dysuria: sometimes  2. WRIST CYST: Duration: months Involved wrist: right Mechanism of injury:  no trauma Location: radial Onset: gradual Severity: 5/10  Radiation: no Status: worse Treatments attempted: none    Relief with NSAIDs?:  No NSAIDs Taken Weakness: no Numbness: no none Redness: no Bruising: no Fevers: no  Patient Active Problem List   Diagnosis Date Noted  . Benign prostatic hyperplasia with nocturia 11/09/2019  . Chronic systolic CHF (congestive heart failure) (HCC) 01/23/2017  . Non-ischemic cardiomyopathy (HCC) 09/19/2016  . HLD (hyperlipidemia) 09/19/2016  . ETOH abuse 09/19/2016  . Moderate mitral regurgitation 09/05/2016  . Cocaine abuse (HCC) 08/23/2016  . Positive D dimer 08/23/2016  . Macrocytosis 08/23/2016  . Alcohol use 08/20/2016  . Major depressive disorder, 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  . 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 Essential hypertension 04/20/2008     Current Outpatient Medications on File Prior to Visit  Medication Sig Dispense Refill  . carvedilol (COREG) 12.5 MG tablet Take 1 tablet (12.5 mg total) by mouth 2 (two) times daily. 60 tablet 3  . sacubitril-valsartan (ENTRESTO) 97-103 MG Take 1 tablet by mouth 2 (two) times daily. 180 tablet 3  . spironolactone (ALDACTONE) 25 MG tablet TAKE 0.5 TABLETS (12.5 MG TOTAL) BY MOUTH DAILY. 15 tablet 11  . furosemide (LASIX) 20 MG tablet Take 1 tablet (20 mg total) by mouth daily as needed. For  weight gain >3 lb in 1 day or >5 lb in 1 week. (Patient not taking: No sig reported) 30 tablet 2   No current facility-administered medications on file prior to visit.    No Known Allergies  Social History   Socioeconomic History  . Marital status: Single    Spouse name: Not on file  . Number of children: Not on file  . Years of education: Not on file  . Highest education level: Not on file  Occupational History  . Not on file  Tobacco Use  . Smoking status: Never Smoker  . Smokeless tobacco: Never Used  Substance and Sexual Activity  . Alcohol use: Yes    Comment: (2) 24 oz beers every other day pt reports  . Drug use: Yes    Types: Marijuana    Comment: pt reports marijuana use occasionally  . Sexual activity: Yes    Birth control/protection: None  Other Topics Concern  . Not on file  Social History Narrative  . Not on file   Social Determinants of Health   Financial Resource Strain: Not on file  Food Insecurity: Not on file  Transportation Needs: Not on file  Physical Activity: Not on file  Stress: Not on file  Social Connections: Not on file  Intimate Partner Violence: Not on file    Family History  Problem Relation Age of Onset  . Diabetes Other   . Hypertension Other     Past Surgical History:  Procedure Laterality Date  . POLYPECTOMY    . RIGHT/LEFT HEART CATH AND CORONARY ANGIOGRAPHY N/A 08/27/2016   Procedure: Right/Left Heart Cath and Coronary Angiography;  Surgeon:  Sherren Mocha, MD;  Location: [facility];  Service: Cardiovascular;  Laterality: N/A;    ROS: Review of Systems Negative except as stated above  PHYSICAL EXAM: BP 116/83 (BP Location: Left Arm, Patient Position: Sitting)   Pulse 91   Temp 98.4 F (36.9 C)   Wt 198 lb 6.4 oz (90 kg)   SpO2 93%   BMI 24.80 kg/m   Physical Exam Constitutional:      Appearance: Normal appearance. He is normal weight.  HENT:     Head: Normocephalic and atraumatic.  Eyes:      Extraocular Movements: Extraocular movements intact.     Pupils: Pupils are equal, round, and reactive to light.  Cardiovascular:     Rate and Rhythm: Normal rate and regular rhythm.     Pulses: Normal pulses.     Heart sounds: Normal heart sounds.  Pulmonary:     Effort: Pulmonary effort is normal.     Breath sounds: Normal breath sounds.  Musculoskeletal:     Right wrist: Swelling present.     Left wrist: Normal.     Cervical back: Normal range of motion and neck supple.     Comments: Ganglion cyst, right wrist, plantar radial aspect, spongy consistency, movable, non-tender, without erythema, and without drainage.   Neurological:     General: No focal deficit present.     Mental Status: He is alert and oriented to person, place, and time.  Psychiatric:        Mood and Affect: Mood normal.        Behavior: Behavior normal.     RIGHT WRIST, PLANTAR ASPECT     RIGHT WRIST, PLANTAR ASPECT, HYPERFLEXED    ASSESSMENT AND PLAN: 1. Benign prostatic hyperplasia with nocturia: - Stable.  - Continue Tamsulosin as prescribed.  - Follow-up with primary physician in 3 months or sooner if needed.  - tamsulosin (FLOMAX) 0.4 MG CAPS capsule; Take 1 capsule (0.4 mg total) by mouth daily.  Dispense: 30 capsule; Refill: 3  2. Ganglion cyst of wrist, right: - Ganglon cyst of right wrist, spongy, and movable. Present for 1 month and gradually worsening. Causing some pain and discomfort.  - Naproxen as prescribed for pain management. - May try over-the-counter wrist as needed for added support. Limit continued use to avoid muscle atrophy.  - Counseled patient that this is likely to resolve spontaneously and usually does not require intervention.  - Patient would like consideration for removal. Counseled patient that cyst may recur with surgical removal.  - Per patient request referral to Hand Surgery for further evaluation and management.  - Follow-up with primary physician as needed.  -  Ambulatory referral to Hand Surgery - naproxen (NAPROSYN) 250 MG tablet; Take 1 tablet (250 mg total) by mouth 2 (two) times daily with a meal for 10 days.  Dispense: 20 tablet; Refill: 0  3. Influenza vaccine administered: - Flu vaccine administered during today's visit.    Patient was given the opportunity to ask questions.  Patient verbalized understanding of the plan and was able to repeat key elements of the plan. Patient was given clear instructions to go to Emergency Department or return to medical center if symptoms don't improve, worsen, or new problems develop.The patient verbalized understanding.   Orders Placed This Encounter  Procedures  . Ambulatory referral to Hand Surgery     Requested Prescriptions   Signed Prescriptions Disp Refills  . naproxen (NAPROSYN) 250 MG tablet 20 tablet 0  Sig: Take 1 tablet (250 mg total) by mouth 2 (two) times daily with a meal for 10 days.  . tamsulosin (FLOMAX) 0.4 MG CAPS capsule 30 capsule 3    Sig: Take 1 capsule (0.4 mg total) by mouth daily.    Return in about 3 months (around 08/18/2020) for Dr. Juleen China.  Camillia Herter, NP

## 2020-05-20 ENCOUNTER — Other Ambulatory Visit: Payer: Self-pay

## 2020-05-20 ENCOUNTER — Other Ambulatory Visit: Payer: Self-pay | Admitting: Family

## 2020-05-20 ENCOUNTER — Ambulatory Visit (INDEPENDENT_AMBULATORY_CARE_PROVIDER_SITE_OTHER): Payer: Medicaid Other | Admitting: Family

## 2020-05-20 ENCOUNTER — Encounter: Payer: Self-pay | Admitting: Family

## 2020-05-20 VITALS — BP 116/83 | HR 91 | Temp 98.4°F | Wt 198.4 lb

## 2020-05-20 DIAGNOSIS — Z23 Encounter for immunization: Secondary | ICD-10-CM | POA: Diagnosis not present

## 2020-05-20 DIAGNOSIS — N401 Enlarged prostate with lower urinary tract symptoms: Secondary | ICD-10-CM | POA: Diagnosis not present

## 2020-05-20 DIAGNOSIS — M67431 Ganglion, right wrist: Secondary | ICD-10-CM | POA: Diagnosis not present

## 2020-05-20 DIAGNOSIS — R351 Nocturia: Secondary | ICD-10-CM | POA: Diagnosis not present

## 2020-05-20 MED ORDER — NAPROXEN 250 MG PO TABS: 250.0000 mg | ORAL_TABLET | Freq: Two times a day (BID) | ORAL | 0 refills | Status: DC

## 2020-05-20 MED ORDER — TAMSULOSIN HCL 0.4 MG PO CAPS: 0.4000 mg | ORAL_CAPSULE | Freq: Every day | ORAL | 3 refills | Status: DC

## 2020-05-20 MED FILL — NAPROXEN 250 MG TABLET: 250 | 10 days supply | Qty: 20 | Fill #0

## 2020-05-20 MED FILL — TAMSULOSIN HCL 0.4 MG CAP: 0.4 | 30 days supply | Qty: 30 | Fill #0

## 2020-05-20 NOTE — Patient Instructions (Addendum)
Continue Tamsulosin for BPH. Follow-up with primary physician in 3 months.  Naproxen for ganglion cyst of right wrist. May try wrist splint as needed. Naproxen for pain and discomfort.  Referral to Hand Surgery for consideration of aspiration or removal of ganglion cyst of right wrist.  Flu vaccine today.  Ganglion Cyst  A ganglion cyst is a non-cancerous, fluid-filled lump that occurs near a joint or tendon. The cyst grows out of a joint or the lining of a tendon. Ganglion cysts most often develop in the hand or wrist, but they can also develop in the shoulder, elbow, hip, knee, ankle, or foot. Ganglion cysts are ball-shaped or egg-shaped. Their size can range from the size of a pea to larger than a grape. Increased activity may cause the cyst to get bigger because more fluid starts to build up. What are the causes? The exact cause of this condition is not known, but it may be related to:  Inflammation or irritation around the joint.  An injury.  Repetitive movements or overuse.  Arthritis. What increases the risk? You are more likely to develop this condition if:  You are a woman.  You are 10-47 years old. What are the signs or symptoms? The main symptom of this condition is a lump. It most often appears on the hand or wrist. In many cases, there are no other symptoms, but a cyst can sometimes cause:  Tingling.  Pain.  Numbness.  Muscle weakness.  Weak grip.  Less range of motion in a joint. How is this diagnosed? Ganglion cysts are usually diagnosed based on a physical exam. Your health care provider will feel the lump and may shine a light next to it. If it is a ganglion cyst, the light will likely shine through it. Your health care provider may order an X-ray, ultrasound, or MRI to rule out other conditions. How is this treated? Ganglion cysts often go away on their own without treatment. If you have pain or other symptoms, treatment may be needed. Treatment is  also needed if the ganglion cyst limits your movement or if it gets infected. Treatment may include:  Wearing a brace or splint on your wrist or finger.  Taking anti-inflammatory medicine.  Having fluid drained from the lump with a needle (aspiration).  Getting a steroid injected into the joint.  Having surgery to remove the ganglion cyst.  Placing a pad on your shoe or wearing shoes that will not rub against the cyst if it is on your foot. Follow these instructions at home:  Do not press on the ganglion cyst, poke it with a needle, or hit it.  Take over-the-counter and prescription medicines only as told by your health care provider.  If you have a brace or splint: ? Wear it as told by your health care provider. ? Remove it as told by your health care provider. Ask if you need to remove it when you take a shower or a bath.  Watch your ganglion cyst for any changes.  Keep all follow-up visits as told by your health care provider. This is important. Contact a health care provider if:  Your ganglion cyst becomes larger or more painful.  You have pus coming from the lump.  You have weakness or numbness in the affected area.  You have a fever or chills. Get help right away if:  You have a fever and have any of these in the cyst area: ? Increased redness. ? Red streaks. ? Swelling.  Summary  A ganglion cyst is a non-cancerous, fluid-filled lump that occurs near a joint or tendon.  Ganglion cysts most often develop in the hand or wrist, but they can also develop in the shoulder, elbow, hip, knee, ankle, or foot.  Ganglion cysts often go away on their own without treatment. This information is not intended to replace advice given to you by your health care provider. Make sure you discuss any questions you have with your health care provider. Document Revised: 04/19/2017 Document Reviewed: 01/04/2017 Elsevier Patient Education  2020 [employer].

## 2020-05-20 NOTE — Progress Notes (Signed)
Pt request med refill of Flomax  Cyst on right wrist for 1 month no pain spongy feeling Wants flu shot

## 2020-06-15 ENCOUNTER — Ambulatory Visit: Payer: Medicaid Other | Admitting: Orthopaedic Surgery

## 2020-06-22 DIAGNOSIS — M6740 Ganglion, unspecified site: Secondary | ICD-10-CM | POA: Insufficient documentation

## 2020-06-27 MED FILL — SPIRONOLACTONE 25 MG TABS: 25 | 30 days supply | Qty: 15 | Fill #6

## 2020-06-27 MED FILL — TAMSULOSIN HCL 0.4 MG CAP: 0.4 | 30 days supply | Qty: 30 | Fill #1

## 2020-07-14 ENCOUNTER — Other Ambulatory Visit: Payer: Self-pay

## 2020-07-14 MED FILL — HYDROCODON-APAP 5-325: 5-325 | 5 days supply | Qty: 25 | Fill #0

## 2020-07-18 ENCOUNTER — Other Ambulatory Visit (HOSPITAL_COMMUNITY): Payer: Self-pay | Admitting: Adult Health

## 2020-07-19 ENCOUNTER — Other Ambulatory Visit (HOSPITAL_COMMUNITY): Payer: Self-pay | Admitting: Cardiology

## 2020-07-19 MED FILL — CARVEDILOL 12.5 MG TABLET: 12.5 | 30 days supply | Qty: 60 | Fill #0

## 2020-07-20 MED FILL — SPIRONOLACTONE 25 MG TABS: 25 | 30 days supply | Qty: 15 | Fill #7

## 2020-07-25 DIAGNOSIS — Z4789 Encounter for other orthopedic aftercare: Secondary | ICD-10-CM | POA: Insufficient documentation

## 2020-07-28 MED FILL — TAMSULOSIN HCL 0.4 MG CAP: 0.4 | 30 days supply | Qty: 30 | Fill #2

## 2020-08-18 ENCOUNTER — Other Ambulatory Visit: Payer: Self-pay | Admitting: Nurse Practitioner

## 2020-08-18 ENCOUNTER — Ambulatory Visit: Payer: Medicaid Other | Admitting: Internal Medicine

## 2020-08-18 ENCOUNTER — Other Ambulatory Visit: Payer: Self-pay

## 2020-08-18 ENCOUNTER — Telehealth (INDEPENDENT_AMBULATORY_CARE_PROVIDER_SITE_OTHER): Payer: Medicaid Other | Admitting: Nurse Practitioner

## 2020-08-18 DIAGNOSIS — N401 Enlarged prostate with lower urinary tract symptoms: Secondary | ICD-10-CM | POA: Diagnosis not present

## 2020-08-18 DIAGNOSIS — R351 Nocturia: Secondary | ICD-10-CM

## 2020-08-18 MED ORDER — TAMSULOSIN HCL 0.4 MG PO CAPS: 0.4000 mg | ORAL_CAPSULE | Freq: Every day | ORAL | 3 refills | Status: DC

## 2020-08-18 NOTE — Progress Notes (Signed)
Virtual Visit via Telephone Note  I connected with [name] [name] on 08/18/20 at  9:00 AM EDT by telephone and verified that I am speaking with the correct person using two identifiers.  Location: Patient: home Provider: office   I discussed the limitations, risks, security and privacy concerns of performing an evaluation and management service by telephone and the availability of in person appointments. I also discussed with the patient that there may be a patient responsible charge related to this service. The patient expressed understanding and agreed to proceed.   History of Present Illness:  Patient presents today for follow-up visit.  He was last seen by [name] [name] on 05/20/2020.  He is a patient of Dr. Juleen China.  Patient has recently been started on Flomax for BPH.  He states that this is working well for him.  He does need refills.  He was also seen at last visit for a ganglion cyst.  He has had surgery to correct this and states that everything is going well.  Overall doing well and no new issues or concerns. Denies f/c/s, n/v/d, hemoptysis, PND, chest pain or edema.       Observations/Objective:  Vitals with BMI 05/20/2020 11/13/2019 11/05/2019  Height - - 6\' 3"   Weight 198 lbs 6 oz 198 lbs 194 lbs  BMI - - 45.36  Systolic 468 032 122  Diastolic 83 82 87  Pulse 91 107 98  Some encounter information is confidential and restricted. Go to Review Flowsheets activity to see all data.      Assessment and Plan:  ASSESSMENT AND PLAN:  Benign prostatic hyperplasia with nocturia: - Stable.  - Continue Tamsulosin as prescribed.  - Follow-up with primary physician in 3 months or sooner if needed.  - tamsulosin (FLOMAX) 0.4 MG CAPS capsule; Take 1 capsule (0.4 mg total) by mouth daily.  Dispense: 30 capsule; Refill: 3  Follow up with Dr. Juleen China for Physical in 3 months       I discussed the assessment and treatment plan with the patient. The patient was provided an  opportunity to ask questions and all were answered. The patient agreed with the plan and demonstrated an understanding of the instructions.   The patient was advised to call back or seek an in-person evaluation if the symptoms worsen or if the condition fails to improve as anticipated.  I provided 23 minutes of non-face-to-face time during this encounter.   Fenton Foy, NP

## 2020-08-18 NOTE — Patient Instructions (Signed)
ASSESSMENT AND PLAN:  Benign prostatic hyperplasia with nocturia: - Stable.  - Continue Tamsulosin as prescribed.  - Follow-up with primary physician in 3 months or sooner if needed.  - tamsulosin (FLOMAX) 0.4 MG CAPS capsule; Take 1 capsule (0.4 mg total) by mouth daily.  Dispense: 30 capsule; Refill: 3  Follow up with Dr. Juleen China for Physical in 3 months

## 2020-08-18 NOTE — Progress Notes (Signed)
Follow-up Pt states had surgery few weeks ago on right wrist -no issues

## 2020-09-10 MED FILL — Spironolactone Tab 25 MG: ORAL | 30 days supply | Qty: 15 | Fill #0 | Status: AC

## 2020-09-12 ENCOUNTER — Other Ambulatory Visit (HOSPITAL_COMMUNITY): Payer: Self-pay

## 2020-09-13 ENCOUNTER — Other Ambulatory Visit (HOSPITAL_COMMUNITY): Payer: Self-pay

## 2020-09-13 MED FILL — Tamsulosin HCl Cap 0.4 MG: ORAL | 30 days supply | Qty: 30 | Fill #0 | Status: AC

## 2020-10-18 ENCOUNTER — Other Ambulatory Visit (HOSPITAL_COMMUNITY): Payer: Self-pay

## 2020-10-18 MED FILL — Tamsulosin HCl Cap 0.4 MG: ORAL | 30 days supply | Qty: 30 | Fill #1 | Status: AC

## 2020-10-25 ENCOUNTER — Other Ambulatory Visit (HOSPITAL_COMMUNITY): Payer: Self-pay

## 2020-10-25 ENCOUNTER — Telehealth (HOSPITAL_COMMUNITY): Payer: Self-pay | Admitting: Pharmacy Technician

## 2020-10-25 NOTE — Telephone Encounter (Signed)
Advanced Heart Failure Patient Advocate Encounter   Received notification from [employer] it is time to renew [place] assistance. Patient has managed Medicaid plan. Will await PA approval before seeking assistance.   [employer] notifies that prior authorization for Delene Loll is required.   PA submitted on CoverMyMeds Key [id number] Status is pending   Will continue to follow.

## 2020-10-26 ENCOUNTER — Other Ambulatory Visit (HOSPITAL_COMMUNITY): Payer: Self-pay

## 2020-10-26 ENCOUNTER — Other Ambulatory Visit (HOSPITAL_COMMUNITY): Payer: Self-pay | Admitting: *Deleted

## 2020-10-26 MED ORDER — ENTRESTO 97-103 MG PO TABS
1.0000 | ORAL_TABLET | Freq: Two times a day (BID) | ORAL | 3 refills | Status: DC
  Filled 2020-10-26: qty 180, 90d supply, fill #0

## 2020-10-26 NOTE — Telephone Encounter (Signed)
Advanced Heart Failure Patient Advocate Encounter  Prior Authorization for Delene Loll has been approved.    Effective dates: 10/25/20 through 10/25/21  Patients co-pay is $3 (90 day)  Called to inform the patient that we will not seek assistance at this time. Sent Jasmine, ([place]) a request to send 90 day supply of Entresto to [facility] outpatient pharmacy.  Jodelle Gross (CPhT) [facility] Patient Advocate  10/26/2020 12:18 PM

## 2020-10-27 ENCOUNTER — Telehealth (HOSPITAL_COMMUNITY): Payer: Self-pay | Admitting: Pharmacy Technician

## 2020-10-27 NOTE — Telephone Encounter (Signed)
Advanced Heart Failure Patient Advocate Encounter  Patient called in requesting a call back. Called back and left him a detailed message where to pick up the [facility] and how much the co-pay is. Advised him to call back with issues.  Charlann Boxer, CPhT

## 2020-11-17 NOTE — Progress Notes (Signed)
Patient did not show for appointment.   

## 2020-11-18 ENCOUNTER — Other Ambulatory Visit (HOSPITAL_COMMUNITY): Payer: Self-pay

## 2020-11-18 ENCOUNTER — Encounter: Payer: Medicaid Other | Admitting: Family

## 2020-11-18 ENCOUNTER — Other Ambulatory Visit: Payer: Self-pay

## 2020-11-18 ENCOUNTER — Encounter (HOSPITAL_COMMUNITY): Payer: Self-pay

## 2020-11-18 ENCOUNTER — Ambulatory Visit (HOSPITAL_COMMUNITY)
Admission: RE | Admit: 2020-11-18 | Discharge: 2020-11-18 | Disposition: A | Discharge status: Home / Self Care | Payer: Medicaid Other | Source: Ambulatory Visit | Attending: Cardiology | Admitting: Cardiology

## 2020-11-18 VITALS — BP 110/84 | HR 99 | Ht 75.0 in | Wt 198.8 lb

## 2020-11-18 DIAGNOSIS — Z79899 Other long term (current) drug therapy: Secondary | ICD-10-CM | POA: Diagnosis not present

## 2020-11-18 DIAGNOSIS — Z8249 Family history of ischemic heart disease and other diseases of the circulatory system: Secondary | ICD-10-CM | POA: Insufficient documentation

## 2020-11-18 DIAGNOSIS — I10 Essential (primary) hypertension: Secondary | ICD-10-CM | POA: Diagnosis not present

## 2020-11-18 DIAGNOSIS — I428 Other cardiomyopathies: Secondary | ICD-10-CM | POA: Insufficient documentation

## 2020-11-18 DIAGNOSIS — I110 Hypertensive heart disease with heart failure: Secondary | ICD-10-CM | POA: Insufficient documentation

## 2020-11-18 DIAGNOSIS — E785 Hyperlipidemia, unspecified: Secondary | ICD-10-CM | POA: Diagnosis not present

## 2020-11-18 DIAGNOSIS — F1410 Cocaine abuse, uncomplicated: Secondary | ICD-10-CM | POA: Insufficient documentation

## 2020-11-18 DIAGNOSIS — F1010 Alcohol abuse, uncomplicated: Secondary | ICD-10-CM | POA: Diagnosis not present

## 2020-11-18 DIAGNOSIS — I5022 Chronic systolic (congestive) heart failure: Secondary | ICD-10-CM

## 2020-11-18 LAB — BASIC METABOLIC PANEL
Anion gap: 14 (ref 5–15)
BUN: 8 mg/dL (ref 6–20)
CO2: 25 mmol/L (ref 22–32)
Calcium: 8.8 mg/dL — ABNORMAL LOW (ref 8.9–10.3)
Chloride: 92 mmol/L — ABNORMAL LOW (ref 98–111)
Creatinine, Ser: 0.92 mg/dL (ref 0.61–1.24)
GFR, Estimated: >60 mL/min (ref >60)
Glucose, Bld: 84 mg/dL (ref 70–99)
Potassium: 4.4 mmol/L (ref 3.5–5.1)
Sodium: 131 mmol/L — ABNORMAL LOW (ref 135–145)

## 2020-11-18 MED ORDER — SPIRONOLACTONE 25 MG PO TABS
12.5000 mg | ORAL_TABLET | Freq: Every day | ORAL | 11 refills | Status: DC
  Filled 2020-11-18: qty 15, 30d supply, fill #0
  Filled 2021-01-03: qty 15, 30d supply, fill #1
  Filled 2021-02-20: qty 15, 30d supply, fill #2
  Filled 2021-04-17: qty 15, 30d supply, fill #3
  Filled 2021-07-18: qty 15, 30d supply, fill #4

## 2020-11-18 NOTE — Progress Notes (Signed)
Advanced Heart Failure Clinic Note   HF Cardiology: Dr Aundra Dubin PCP: Dr. Juleen China  HPI:  [name] [name] is a 50 y.o. male with HTN, HLD, Systolic CHF, and history of polysubstance abuse (Cocaine, THC, and ETOH)  Presented to [facility] 08/20/16 with worsening dyspnea and CP.  He was diagnosed with systolic CHF with EF 25% on echo. Cath as below with normal coronaries and preserved cardiac output. Initial coox 50.5% so transiently placed on milrinone 0.125 mcg/kg/min. Pt tolerated wean with diuresis and adjustment of medications.   Echo 02/2017: EF up to 50% with mild diffuse hypokinesis.  Echo in 3/21 showed EF up to 70-75%, hyperdynamic.   He presented 6/21 followup of CHF.  Generally doing well, SOB walking long distances, mild dyspnea with stairs.  No smoking or ETOH use. No chest pain.  No lightheadedness.    Today he returns for HF follow up. Overall feeling fine, SOB with minimal activity, says this is his baseline. Some dizziness with position changes. Denies increasing SOB, CP, dizziness, edema, or PND/Orthopnea. Appetite ok. No fever or chills. He does not weigh at home. ETOH on weekends. He is on disability. He has been off of spiro for 3 weeks.  ECG (personally reviewed): NSR 97 bpm.  Labs (7/18): digoxin 0.7 Labs (9/18): K 3.9, creatinine 0.88, BNP 20 Labs (5/19): K 4.1, creatinine 0.93, TSH normal, hgb 11.7 Labs (6/21): K 4.3, creatinine 0.71, LDL 88, TSH normal  Review of systems complete and found to be negative unless listed in HPI.    Past medical History 1. Chronic systolic CHF: Nonischemic cardiomyopathy, heavy ETOH use versus long-standing HTN. New diagnosis 08/2016 . HIV negative, SPEP negative.  - LHC (4/18) with no CAD. - Echo (4/18): LVEF 15%, Mod MR, Mod LAE, RV mild reduced, Mild RAE.  - RHC (4/18): mean RA 1, PA 22/11, mean PCWP 15, CI 2.7 (on milrinone 0.125) - NICM work up ongoing.  ? Etiology from heavy ETOH use of long standing HTN with LVH on EKG - Echo  (10/18): EF 50%, diffuse hypokinesis, normal RV size and systolic function, small IVC. - Echo (3/21): EF 70-75%, hyperdynamic  2. HTN:  Long standing and poorly controlled. LVH on EKG 3. Hyperlipidemia 4. Polysubstance abuse: h/o cocaine and marijuana.  5. MR: Moderate by 4/18 echo, likely functional.  6. ETOH abuse: Has cut back.  7. Sleep study 12/18 negative  Current Outpatient Medications  Medication Sig Dispense Refill   carvedilol (COREG) 12.5 MG tablet TAKE 1 TABLET (12.5 MG TOTAL) BY MOUTH 2 (TWO) TIMES DAILY. 60 tablet 3   sacubitril-valsartan (ENTRESTO) 97-103 MG Take 1 tablet by mouth 2 (two) times daily. 180 tablet 3   tamsulosin (FLOMAX) 0.4 MG CAPS capsule TAKE 1 CAPSULE (0.4 MG TOTAL) BY MOUTH DAILY. 30 capsule 3   spironolactone (ALDACTONE) 25 MG tablet TAKE 1/2 TABLET (12.5 MG TOTAL) BY MOUTH DAILY. (Patient not taking: Reported on 11/18/2020) 15 tablet 11   No current facility-administered medications for this encounter.    No Known Allergies    Social History   Socioeconomic History   Marital status: Single    Spouse name: Not on file   Number of children: Not on file   Years of education: Not on file   Highest education level: Not on file  Occupational History   Not on file  Tobacco Use   Smoking status: Never   Smokeless tobacco: Never  Substance and Sexual Activity   Alcohol use: Yes    Comment: (  2) 24 oz beers every other day pt reports   Drug use: Yes    Types: Marijuana    Comment: pt reports marijuana use occasionally   Sexual activity: Yes    Birth control/protection: None  Other Topics Concern   Not on file  Social History Narrative   Not on file   Social Determinants of Health   Financial Resource Strain: Not on file  Food Insecurity: Not on file  Transportation Needs: Not on file  Physical Activity: Not on file  Stress: Not on file  Social Connections: Not on file  Intimate Partner Violence: Not on file    Family History  Problem  Relation Age of Onset   Diabetes Other    Hypertension Other    BP 110/84   Pulse 99   Ht 6\' 3"  (1.905 m)   Wt 90.2 kg (198 lb 12.8 oz)   SpO2 97%   BMI 24.85 kg/m   Wt Readings from Last 3 Encounters:  11/18/20 90.2 kg (198 lb 12.8 oz)  05/20/20 90 kg (198 lb 6.4 oz)  11/13/19 89.8 kg (198 lb)    PHYSICAL EXAM:  General:  NAD. No resp difficulty HEENT: Normal Neck: Supple. No JVD. Carotids 2+ bilat; no bruits. No lymphadenopathy or thryomegaly appreciated. Cor: PMI nondisplaced. Regular rate & rhythm. No rubs, gallops or murmurs. Lungs: Clear Abdomen: Soft, nontender, nondistended. No hepatosplenomegaly. No bruits or masses. Good bowel sounds. Extremities: No cyanosis, clubbing, rash, edema Neuro: Alert & oriented x 3, cranial nerves grossly intact. Moves all 4 extremities w/o difficulty. Affect pleasant.  ASSESSMENT & PLAN: 1. Chronic systolic CHF: Nonischemic cardiomyopathy.  Possibly due to ETOH abuse versus long-standing HTN.  Echo (4/18) LVEF 15%, Mod MR, Mod LAE, RV mild reduced, Mild RAE.  Cath with no CAD.  Echo 02/2017 EF up to 50% with cessation of ETOH and treatment of HTN.  Echo in 3/21 with EF up to 70-75%, hyperdynamic.  Still with NYHA class II symptoms but not volume overloaded on exam.   - Continue Coreg 12.5 mg bid.  - Continue Entresto 97/103 mg bid. BMET today.  - Restart spiro 12.5 mg daily. 2. HTN: BP stable on current regimen.  3. Polysubstance abuse: Tox panel + for THC and Cocaine on admission 08/2016. - He has quit using drugs.  4. ETOH abuse:  Drinking ETOH on weekend. Encouraged cessation.   Followup 9-12 months with Dr. Wynema Birch [facility], FNP-BC 11/18/2020

## 2020-11-18 NOTE — Patient Instructions (Addendum)
EKG done today.  Labs done today. We will contact you only if your labs are abnormal.  RESTART Spironolactone 12.5mg  (1/2 tablet) by mouth daily.   No other medication changes were made. Please continue all current medications as prescribed.  Your physician recommends that you schedule a follow-up appointment in: 10 days and in 4 weeks for a lab only appointment and in 9-12 months. Please contact our office in April 2023 to schedule an appointment.   If you have any questions or concerns before your next appointment please send [place] a message through [place] or call our office at [phone].    TO LEAVE A MESSAGE FOR THE NURSE SELECT OPTION 2, PLEASE LEAVE A MESSAGE INCLUDING: YOUR NAME DATE OF BIRTH CALL BACK NUMBER REASON FOR CALL**this is important as we prioritize the call backs  YOU WILL RECEIVE A CALL BACK THE SAME DAY AS LONG AS YOU CALL BEFORE 4:00 PM   Do the following things EVERYDAY: Weigh yourself in the morning before breakfast. Write it down and keep it in a log. Take your medicines as prescribed Eat low salt foods--Limit salt (sodium) to 2000 mg per day.  Stay as active as you can everyday Limit all fluids for the day to less than 2 liters   At the [facility], you and your health needs are our priority. As part of our continuing mission to provide you with exceptional heart care, we have created designated Provider Care Teams. These Care Teams include your primary Cardiologist (physician) and Advanced Practice Providers (APPs- Physician Assistants and Nurse Practitioners) who all work together to provide you with the care you need, when you need it.   You may see any of the following providers on your designated Care Team at your next follow up: Dr Glori Bickers Dr Haynes Kerns, NP Lyda Jester, [place] Audry Riles, PharmD   Please be sure to bring in all your medications bottles to every appointment.

## 2020-11-22 ENCOUNTER — Other Ambulatory Visit (HOSPITAL_COMMUNITY): Payer: Self-pay

## 2020-11-22 MED FILL — Carvedilol Tab 12.5 MG: ORAL | 30 days supply | Qty: 60 | Fill #0 | Status: AC

## 2020-11-22 MED FILL — Tamsulosin HCl Cap 0.4 MG: ORAL | 30 days supply | Qty: 30 | Fill #2 | Status: AC

## 2020-12-02 ENCOUNTER — Other Ambulatory Visit (HOSPITAL_COMMUNITY): Payer: Medicaid Other

## 2020-12-16 ENCOUNTER — Other Ambulatory Visit (HOSPITAL_COMMUNITY): Payer: Medicaid Other

## 2020-12-26 MED FILL — Tamsulosin HCl Cap 0.4 MG: ORAL | 30 days supply | Qty: 30 | Fill #3 | Status: AC

## 2020-12-27 ENCOUNTER — Other Ambulatory Visit (HOSPITAL_COMMUNITY): Payer: Self-pay

## 2020-12-29 ENCOUNTER — Other Ambulatory Visit (HOSPITAL_COMMUNITY): Payer: Self-pay

## 2021-01-03 ENCOUNTER — Other Ambulatory Visit (HOSPITAL_COMMUNITY): Payer: Self-pay

## 2021-01-29 ENCOUNTER — Other Ambulatory Visit: Payer: Self-pay | Admitting: Nurse Practitioner

## 2021-01-29 DIAGNOSIS — N401 Enlarged prostate with lower urinary tract symptoms: Secondary | ICD-10-CM

## 2021-01-31 ENCOUNTER — Other Ambulatory Visit: Payer: Self-pay | Admitting: Nurse Practitioner

## 2021-01-31 ENCOUNTER — Other Ambulatory Visit (HOSPITAL_COMMUNITY): Payer: Self-pay

## 2021-01-31 DIAGNOSIS — R351 Nocturia: Secondary | ICD-10-CM

## 2021-01-31 DIAGNOSIS — N401 Enlarged prostate with lower urinary tract symptoms: Secondary | ICD-10-CM

## 2021-02-01 ENCOUNTER — Other Ambulatory Visit (HOSPITAL_COMMUNITY): Payer: Self-pay

## 2021-02-01 ENCOUNTER — Other Ambulatory Visit: Payer: Self-pay | Admitting: Nurse Practitioner

## 2021-02-01 DIAGNOSIS — N401 Enlarged prostate with lower urinary tract symptoms: Secondary | ICD-10-CM

## 2021-02-02 ENCOUNTER — Other Ambulatory Visit (HOSPITAL_COMMUNITY): Payer: Self-pay

## 2021-02-02 ENCOUNTER — Other Ambulatory Visit: Payer: Self-pay | Admitting: Nurse Practitioner

## 2021-02-02 DIAGNOSIS — N401 Enlarged prostate with lower urinary tract symptoms: Secondary | ICD-10-CM

## 2021-02-03 ENCOUNTER — Telehealth: Payer: Self-pay | Admitting: Internal Medicine

## 2021-02-03 ENCOUNTER — Other Ambulatory Visit (HOSPITAL_COMMUNITY): Payer: Self-pay

## 2021-02-03 NOTE — Telephone Encounter (Signed)
.  [name], obst are scheduled for a virtual visit with your provider today.    Just as we do with appointments in the office, we must obtain your consent to participate.  Your consent will be active for this visit and any virtual visit you may have with one of our providers in the next 365 days.    If you have a MyChart account, I can also send a copy of this consent to you electronically.  All virtual visits are billed to your insurance company just like a traditional visit in the office.  As this is a virtual visit, video technology does not allow for your provider to perform a traditional examination.  This may limit your provider's ability to fully assess your condition.  If your provider identifies any concerns that need to be evaluated in person or the need to arrange testing such as labs, EKG, etc, we will make arrangements to do so.    Although advances in technology are sophisticated, we cannot ensure that it will always work on either your end or our end.  If the connection with a video visit is poor, we may have to switch to a telephone visit.  With either a video or telephone visit, we are not always able to ensure that we have a secure connection.   I need to obtain your verbal consent now.   Are you willing to proceed with your visit today?   [name] [name] has provided verbal consent on 02/03/2021 for a virtual visit (video or telephone).   [name] 02/03/2021  8:29 AM

## 2021-02-06 ENCOUNTER — Encounter: Payer: Self-pay | Admitting: Nurse Practitioner

## 2021-02-06 ENCOUNTER — Other Ambulatory Visit (HOSPITAL_COMMUNITY): Payer: Self-pay

## 2021-02-06 ENCOUNTER — Other Ambulatory Visit: Payer: Self-pay

## 2021-02-06 ENCOUNTER — Ambulatory Visit (INDEPENDENT_AMBULATORY_CARE_PROVIDER_SITE_OTHER): Payer: Medicaid Other | Admitting: Nurse Practitioner

## 2021-02-06 DIAGNOSIS — N401 Enlarged prostate with lower urinary tract symptoms: Secondary | ICD-10-CM

## 2021-02-06 DIAGNOSIS — R351 Nocturia: Secondary | ICD-10-CM | POA: Diagnosis not present

## 2021-02-06 MED ORDER — TAMSULOSIN HCL 0.4 MG PO CAPS
0.4000 mg | ORAL_CAPSULE | Freq: Every day | ORAL | 3 refills | Status: DC
  Filled 2021-02-06: qty 30, 30d supply, fill #0
  Filled 2021-03-06: qty 30, 30d supply, fill #1
  Filled 2021-04-17: qty 30, 30d supply, fill #2
  Filled 2021-05-29: qty 30, 30d supply, fill #3

## 2021-02-06 NOTE — Progress Notes (Signed)
Virtual Visit via Telephone Note  I connected with [name] [name] on 02/06/21 at  8:50 AM EDT by telephone and verified that I am speaking with the correct person using two identifiers.  Location: Patient: home Provider: office   I discussed the limitations, risks, security and privacy concerns of performing an evaluation and management service by telephone and the availability of in person appointments. I also discussed with the patient that there may be a patient responsible charge related to this service. The patient expressed understanding and agreed to proceed.   History of Present Illness:  Patient presents today for follow-up visit. He is a patient of Dr. Juleen China.  Patient has recently been started on Flomax for BPH.  Overall doing well and no new issues or concerns. Patient will need a routine follow up scheduled in 3 months. Denies f/c/s, n/v/d, hemoptysis, PND, chest pain or edema.    Observations/Objective:  Vitals with BMI 11/18/2020 05/20/2020 11/13/2019  Height '6\' 3"'$  - -  Weight 198 lbs 13 oz 198 lbs 6 oz 198 lbs  BMI 99991111 - -  Systolic [id number]  Diastolic 84 83 82  Pulse 99 91 107  Some encounter information is confidential and restricted. Go to Review Flowsheets activity to see all data.      Assessment and Plan:  Benign prostatic hyperplasia with nocturia: - Stable.  - Continue Tamsulosin as prescribed.  - Follow-up with primary physician in 3 months or sooner if needed.  - tamsulosin (FLOMAX) 0.4 MG CAPS capsule; Take 1 capsule (0.4 mg total) by mouth daily.  Dispense: 30 capsule; Refill: 3   Follow up to establish care with new provider in 3 months    I discussed the assessment and treatment plan with the patient. The patient was provided an opportunity to ask questions and all were answered. The patient agreed with the plan and demonstrated an understanding of the instructions.   The patient was advised to call back or seek an in-person evaluation if  the symptoms worsen or if the condition fails to improve as anticipated.  I provided 23 minutes of non-face-to-face time during this encounter.   Fenton Foy, NP

## 2021-02-06 NOTE — Patient Instructions (Signed)
Benign prostatic hyperplasia with nocturia: - Stable.  - Continue Tamsulosin as prescribed.  - Follow-up with primary physician in 3 months or sooner if needed.  - tamsulosin (FLOMAX) 0.4 MG CAPS capsule; Take 1 capsule (0.4 mg total) by mouth daily.  Dispense: 30 capsule; Refill: 3   Follow up to establish care with new provider in 3 months

## 2021-02-15 MED FILL — Carvedilol Tab 12.5 MG: ORAL | 30 days supply | Qty: 60 | Fill #1 | Status: AC

## 2021-02-16 ENCOUNTER — Other Ambulatory Visit (HOSPITAL_COMMUNITY): Payer: Self-pay

## 2021-02-20 ENCOUNTER — Other Ambulatory Visit (HOSPITAL_COMMUNITY): Payer: Self-pay

## 2021-03-06 ENCOUNTER — Other Ambulatory Visit (HOSPITAL_COMMUNITY): Payer: Self-pay

## 2021-04-15 NOTE — Progress Notes (Signed)
Erroneous encounter

## 2021-04-17 ENCOUNTER — Other Ambulatory Visit (HOSPITAL_COMMUNITY): Payer: Self-pay

## 2021-04-20 ENCOUNTER — Encounter: Payer: Medicaid Other | Admitting: Family

## 2021-04-20 DIAGNOSIS — N401 Enlarged prostate with lower urinary tract symptoms: Secondary | ICD-10-CM

## 2021-05-29 ENCOUNTER — Other Ambulatory Visit (HOSPITAL_COMMUNITY): Payer: Self-pay

## 2021-05-31 ENCOUNTER — Other Ambulatory Visit (HOSPITAL_COMMUNITY): Payer: Self-pay

## 2021-06-20 ENCOUNTER — Other Ambulatory Visit (HOSPITAL_COMMUNITY): Payer: Self-pay

## 2021-06-20 MED FILL — Carvedilol Tab 12.5 MG: ORAL | 30 days supply | Qty: 60 | Fill #2 | Status: CN

## 2021-06-28 ENCOUNTER — Other Ambulatory Visit (HOSPITAL_COMMUNITY): Payer: Self-pay

## 2021-07-05 ENCOUNTER — Other Ambulatory Visit: Payer: Self-pay | Admitting: Nurse Practitioner

## 2021-07-05 DIAGNOSIS — N401 Enlarged prostate with lower urinary tract symptoms: Secondary | ICD-10-CM

## 2021-07-05 DIAGNOSIS — R351 Nocturia: Secondary | ICD-10-CM

## 2021-07-05 MED FILL — Carvedilol Tab 12.5 MG: ORAL | 30 days supply | Qty: 60 | Fill #2 | Status: AC

## 2021-07-06 ENCOUNTER — Other Ambulatory Visit (HOSPITAL_COMMUNITY): Payer: Self-pay

## 2021-07-07 ENCOUNTER — Other Ambulatory Visit (HOSPITAL_COMMUNITY): Payer: Self-pay

## 2021-07-07 ENCOUNTER — Other Ambulatory Visit: Payer: Self-pay | Admitting: Nurse Practitioner

## 2021-07-07 DIAGNOSIS — R351 Nocturia: Secondary | ICD-10-CM

## 2021-07-07 DIAGNOSIS — N401 Enlarged prostate with lower urinary tract symptoms: Secondary | ICD-10-CM

## 2021-07-12 ENCOUNTER — Other Ambulatory Visit: Payer: Self-pay | Admitting: Nurse Practitioner

## 2021-07-12 ENCOUNTER — Other Ambulatory Visit (HOSPITAL_COMMUNITY): Payer: Self-pay

## 2021-07-12 DIAGNOSIS — N401 Enlarged prostate with lower urinary tract symptoms: Secondary | ICD-10-CM

## 2021-07-14 ENCOUNTER — Other Ambulatory Visit (HOSPITAL_COMMUNITY): Payer: Self-pay

## 2021-07-17 ENCOUNTER — Other Ambulatory Visit: Payer: Self-pay | Admitting: Nurse Practitioner

## 2021-07-17 ENCOUNTER — Other Ambulatory Visit (HOSPITAL_COMMUNITY): Payer: Self-pay

## 2021-07-17 DIAGNOSIS — N401 Enlarged prostate with lower urinary tract symptoms: Secondary | ICD-10-CM

## 2021-07-18 ENCOUNTER — Other Ambulatory Visit (HOSPITAL_COMMUNITY): Payer: Self-pay

## 2021-07-18 ENCOUNTER — Telehealth: Payer: Self-pay | Admitting: Family Medicine

## 2021-07-18 NOTE — Telephone Encounter (Signed)
Rx #: [id number]  tamsulosin (FLOMAX) 0.4 MG CAPS capsule [[id number]]   Pharmacy  [facility]  [address] [place] [address]  Phone:  [phone]  Fax:  [phone]  DEA #:  [id number]

## 2021-07-18 NOTE — Telephone Encounter (Signed)
Patient will  need an appointment for any medications

## 2021-07-21 ENCOUNTER — Other Ambulatory Visit: Payer: Self-pay | Admitting: Nurse Practitioner

## 2021-07-21 ENCOUNTER — Other Ambulatory Visit (HOSPITAL_COMMUNITY): Payer: Self-pay

## 2021-07-21 DIAGNOSIS — R351 Nocturia: Secondary | ICD-10-CM

## 2021-07-21 DIAGNOSIS — N401 Enlarged prostate with lower urinary tract symptoms: Secondary | ICD-10-CM

## 2021-07-24 ENCOUNTER — Encounter: Payer: Self-pay | Admitting: Nurse Practitioner

## 2021-07-24 ENCOUNTER — Other Ambulatory Visit (HOSPITAL_COMMUNITY): Payer: Self-pay

## 2021-07-24 ENCOUNTER — Ambulatory Visit (INDEPENDENT_AMBULATORY_CARE_PROVIDER_SITE_OTHER): Payer: Medicaid Other | Admitting: Nurse Practitioner

## 2021-07-24 ENCOUNTER — Other Ambulatory Visit (HOSPITAL_COMMUNITY): Payer: Self-pay | Admitting: Family Medicine

## 2021-07-24 ENCOUNTER — Other Ambulatory Visit: Payer: Self-pay

## 2021-07-24 VITALS — BP 149/100 | HR 100 | Wt 210.4 lb

## 2021-07-24 DIAGNOSIS — N401 Enlarged prostate with lower urinary tract symptoms: Secondary | ICD-10-CM

## 2021-07-24 DIAGNOSIS — I10 Essential (primary) hypertension: Secondary | ICD-10-CM

## 2021-07-24 DIAGNOSIS — R351 Nocturia: Secondary | ICD-10-CM | POA: Diagnosis not present

## 2021-07-24 MED ORDER — TAMSULOSIN HCL 0.4 MG PO CAPS
0.4000 mg | ORAL_CAPSULE | Freq: Every day | ORAL | 3 refills | Status: DC
  Filled 2021-07-24: qty 30, 30d supply, fill #0
  Filled 2021-08-28: qty 30, 30d supply, fill #1
  Filled 2021-09-26: qty 30, 30d supply, fill #2
  Filled 2021-10-31: qty 30, 30d supply, fill #3

## 2021-07-24 MED ORDER — CARVEDILOL 12.5 MG PO TABS
12.5000 mg | ORAL_TABLET | Freq: Two times a day (BID) | ORAL | 0 refills | Status: DC
  Filled 2021-07-24: qty 60, 30d supply, fill #0

## 2021-07-24 MED ORDER — SPIRONOLACTONE 25 MG PO TABS
12.5000 mg | ORAL_TABLET | Freq: Every day | ORAL | 11 refills | Status: DC
  Filled 2021-08-02: qty 15, 30d supply, fill #0

## 2021-07-24 NOTE — Patient Instructions (Addendum)
1. Benign prostatic hyperplasia with nocturia ? ?- tamsulosin (FLOMAX) 0.4 MG CAPS capsule; Take 1 capsule (0.4 mg total) by mouth daily.  Dispense: 30 capsule; Refill: 3 ? ?2. Essential hypertension ? ?- spironolactone (ALDACTONE) 25 MG tablet; Take 0.5 tablets (12.5 mg total) by mouth daily.  Dispense: 15 tablet; Refill: 11 ? ?Follow up: ? ?Follow up with Dr. Redmond Pulling within 1 month ?

## 2021-07-24 NOTE — Progress Notes (Signed)
? ?'@Patient'$  ID: [name] [name], male    DOB: May 19, 1971, 51 y.o.   MRN: [id number] ? ?Chief Complaint  ?Patient presents with  ? Medication Refill  ? Foot Swelling  ?  Both foot/ankles   ? ? ?Referring provider: ?Dorna Mai, MD ? ?HPI ? ? ?Patient presents today for medication refills.  He states that he needs refills for spironolactone and Flomax.  Patient states that they and ankles have been swelling since he has been out of his medications.  We discussed that patient will need to follow-up with his PCP within 1 month.  No other issues or concerns today. Denies f/c/s, n/v/d, hemoptysis, PND, chest pain or edema. ? ? ? ? ?No Known Allergies ? ?Immunization History  ?Administered Date(s) Administered  ? Influenza Whole 04/25/2009  ? Td 05/09/2009  ? Tdap 05/21/2017  ? ? ?Past Medical History:  ?Diagnosis Date  ? COLONIC POLYPS, BENIGN 01/20/2010  ? Annotation: 12/2009: path benign; rpt 2016 Qualifier: Diagnosis of  By: Jorene Minors, Scott    ? Hypertension   ? ? ?Tobacco History: ?Social History  ? ?Tobacco Use  ?Smoking Status Never  ?Smokeless Tobacco Never  ? ?Counseling given: Not Answered ? ? ?Outpatient Encounter Medications as of 07/24/2021  ?Medication Sig  ? carvedilol (COREG) 12.5 MG tablet TAKE 1 TABLET (12.5 MG TOTAL) BY MOUTH 2 (TWO) TIMES DAILY.  ? sacubitril-valsartan (ENTRESTO) 97-103 MG Take 1 tablet by mouth 2 (two) times daily.  ? [DISCONTINUED] spironolactone (ALDACTONE) 25 MG tablet Take 0.5 tablets (12.5 mg total) by mouth daily.  ? [DISCONTINUED] tamsulosin (FLOMAX) 0.4 MG CAPS capsule Take 1 capsule (0.4 mg total) by mouth daily.  ? spironolactone (ALDACTONE) 25 MG tablet Take 0.5 tablets (12.5 mg total) by mouth daily.  ? tamsulosin (FLOMAX) 0.4 MG CAPS capsule Take 1 capsule (0.4 mg total) by mouth daily.  ? ?No facility-administered encounter medications on file as of 07/24/2021.  ? ? ? ?Review of Systems ? ?Review of Systems  ?Constitutional: Negative.   ?HENT: Negative.    ?Cardiovascular:  Negative.   ?Gastrointestinal: Negative.   ?Allergic/Immunologic: Negative.   ?Neurological: Negative.   ?Psychiatric/Behavioral: Negative.     ? ? ? ?Physical Exam ? ?BP (!) 149/100   Pulse 100   Wt 210 lb 6.4 oz (95.4 kg)   SpO2 94%   BMI 26.30 kg/m?  ? ?Wt Readings from Last 5 Encounters:  ?07/24/21 210 lb 6.4 oz (95.4 kg)  ?11/18/20 198 lb 12.8 oz (90.2 kg)  ?05/20/20 198 lb 6.4 oz (90 kg)  ?11/13/19 198 lb (89.8 kg)  ?11/05/19 194 lb (88 kg)  ? ? ? ?Physical Exam ?Vitals and nursing note reviewed.  ?Constitutional:   ?   General: He is not in acute distress. ?   Appearance: He is well-developed.  ?Cardiovascular:  ?   Rate and Rhythm: Normal rate and regular rhythm.  ?Pulmonary:  ?   Effort: Pulmonary effort is normal.  ?   Breath sounds: Normal breath sounds.  ?Skin: ?   General: Skin is warm and dry.  ?Neurological:  ?   Mental Status: He is alert and oriented to person, place, and time.  ? ? ? ?Lab Results: ? ?CBC ?   ?Component Value Date/Time  ? WBC 5.4 11/05/2019 1044  ? WBC 6.2 07/30/2019 0942  ? RBC 4.09 (L) 11/05/2019 1044  ? RBC 4.09 (L) 07/30/2019 0942  ? HGB 14.6 11/05/2019 1044  ? HCT 41.2 11/05/2019 1044  ? PLT 261  11/05/2019 1044  ? MCV 101 (H) 11/05/2019 1044  ? MCH 35.7 (H) 11/05/2019 1044  ? MCH 36.7 (H) 07/30/2019 9449  ? MCHC 35.4 11/05/2019 1044  ? MCHC 34.5 07/30/2019 0942  ? RDW 13.0 11/05/2019 1044  ? LYMPHSABS 2.1 11/05/2019 1044  ? MONOABS 0.4 09/14/2009 2259  ? EOSABS 0.2 11/05/2019 1044  ? BASOSABS 0.0 11/05/2019 1044  ? ? ?BMET ?   ?Component Value Date/Time  ? NA 131 (L) 11/18/2020 0940  ? NA 136 11/05/2019 1044  ? K 4.4 11/18/2020 0940  ? CL 92 (L) 11/18/2020 0940  ? CO2 25 11/18/2020 0940  ? GLUCOSE 84 11/18/2020 0940  ? BUN 8 11/18/2020 0940  ? BUN 8 11/05/2019 1044  ? CREATININE 0.92 11/18/2020 0940  ? CALCIUM 8.8 (L) 11/18/2020 0940  ? GFRNONAA >60 11/18/2020 0940  ? GFRAA 126 11/05/2019 1044  ? ? ?BNP ?   ?Component Value Date/Time  ? BNP 28.1 07/13/2019 0941   ? ? ?ProBNP ?No results found for: PROBNP ? ?Imaging: ?No results found. ? ? ?Assessment & Plan:  ? ?Benign prostatic hyperplasia with nocturia ?- tamsulosin (FLOMAX) 0.4 MG CAPS capsule; Take 1 capsule (0.4 mg total) by mouth daily.  Dispense: 30 capsule; Refill: 3 ? ?2. Essential hypertension ? ?- spironolactone (ALDACTONE) 25 MG tablet; Take 0.5 tablets (12.5 mg total) by mouth daily.  Dispense: 15 tablet; Refill: 11 ? ?Follow up: ? ?Follow up with Dr. Redmond Pulling within 1 month ? ? ? ? ?Fenton Foy, NP ?07/24/2021 ? ?

## 2021-07-24 NOTE — Assessment & Plan Note (Signed)
-   tamsulosin (FLOMAX) 0.4 MG CAPS capsule; Take 1 capsule (0.4 mg total) by mouth daily.  Dispense: 30 capsule; Refill: 3 ? ?2. Essential hypertension ? ?- spironolactone (ALDACTONE) 25 MG tablet; Take 0.5 tablets (12.5 mg total) by mouth daily.  Dispense: 15 tablet; Refill: 11 ? ?Follow up: ? ?Follow up with Dr. Redmond Pulling within 1 month ?

## 2021-08-02 ENCOUNTER — Other Ambulatory Visit (HOSPITAL_COMMUNITY): Payer: Self-pay

## 2021-08-21 ENCOUNTER — Ambulatory Visit: Payer: Medicaid Other | Admitting: Family Medicine

## 2021-08-21 ENCOUNTER — Encounter: Payer: Self-pay | Admitting: Family Medicine

## 2021-08-21 ENCOUNTER — Other Ambulatory Visit (HOSPITAL_COMMUNITY): Payer: Self-pay

## 2021-08-21 DIAGNOSIS — I10 Essential (primary) hypertension: Secondary | ICD-10-CM

## 2021-08-21 MED ORDER — CARVEDILOL 12.5 MG PO TABS
12.5000 mg | ORAL_TABLET | Freq: Two times a day (BID) | ORAL | 1 refills | Status: DC
  Filled 2021-08-21: qty 180, 90d supply, fill #0

## 2021-08-21 MED ORDER — ENTRESTO 97-103 MG PO TABS
1.0000 | ORAL_TABLET | Freq: Two times a day (BID) | ORAL | 1 refills | Status: DC
  Filled 2021-08-21: qty 180, 90d supply, fill #0

## 2021-08-21 MED ORDER — SPIRONOLACTONE 25 MG PO TABS
12.5000 mg | ORAL_TABLET | Freq: Every day | ORAL | 1 refills | Status: DC
  Filled 2021-08-21 – 2021-09-05 (×2): qty 45, 90d supply, fill #0
  Filled 2021-12-27: qty 45, 90d supply, fill #1

## 2021-08-21 NOTE — Progress Notes (Signed)
Patient is here for f/*up HTN . Patient has no other concerns today for provider. ?

## 2021-08-23 NOTE — Progress Notes (Signed)
? ?Established Patient Office Visit ? ?Subjective:  ?Patient ID: [name] [name], male    DOB: 1970/05/31  Age: 51 y.o. MRN: [id number] ? ?CC:  ?Chief Complaint  ?Patient presents with  ? Follow-up  ? Hypertension  ? ? ?HPI ?Dow Blahnik Fata presents for follow up of hypertension. Patient denies acute complaints or concerns.  ? ?Past Medical History:  ?Diagnosis Date  ? COLONIC POLYPS, BENIGN 01/20/2010  ? Annotation: 12/2009: path benign; rpt 2016 Qualifier: Diagnosis of  By: Jorene Minors, Scott    ? Hypertension   ? ? ?Past Surgical History:  ?Procedure Laterality Date  ? POLYPECTOMY    ? RIGHT/LEFT HEART CATH AND CORONARY ANGIOGRAPHY N/A 08/27/2016  ? Procedure: Right/Left Heart Cath and Coronary Angiography;  Surgeon: Sherren Mocha, MD;  Location: [facility];  Service: Cardiovascular;  Laterality: N/A;  ? ? ?Family History  ?Problem Relation Age of Onset  ? Diabetes Other   ? Hypertension Other   ? ? ?Social History  ? ?Socioeconomic History  ? Marital status: Single  ?  Spouse name: Not on file  ? Number of children: Not on file  ? Years of education: Not on file  ? Highest education level: Not on file  ?Occupational History  ? Not on file  ?Tobacco Use  ? Smoking status: Never  ? Smokeless tobacco: Never  ?Substance and Sexual Activity  ? Alcohol use: Yes  ?  Comment: (2) 24 oz beers every other day pt reports  ? Drug use: Yes  ?  Types: Marijuana  ?  Comment: pt reports marijuana use occasionally  ? Sexual activity: Yes  ?  Birth control/protection: None  ?Other Topics Concern  ? Not on file  ?Social History Narrative  ? Not on file  ? ?Social Determinants of Health  ? ?Financial Resource Strain: Not on file  ?Food Insecurity: Not on file  ?Transportation Needs: Not on file  ?Physical Activity: Not on file  ?Stress: Not on file  ?Social Connections: Not on file  ?Intimate Partner Violence: Not on file  ? ? ?ROS ?Review of Systems  ?All other systems reviewed and are negative. ? ?Objective:  ? ?Today's  Vitals: BP 113/77   Pulse 89   Temp 98 ?F (36.7 ?C) (Oral)   Resp 16   Wt 210 lb (95.3 kg)   SpO2 97%   BMI 26.25 kg/m?  ? ?Physical Exam ?Vitals and nursing note reviewed.  ?Constitutional:   ?   General: He is not in acute distress. ?   Appearance: He is well-developed.  ?Cardiovascular:  ?   Rate and Rhythm: Normal rate and regular rhythm.  ?Pulmonary:  ?   Effort: Pulmonary effort is normal.  ?   Breath sounds: Normal breath sounds.  ?Musculoskeletal:  ?   Right lower leg: No edema.  ?   Left lower leg: No edema.  ?Skin: ?   General: Skin is warm and dry.  ?Neurological:  ?   Mental Status: He is alert and oriented to person, place, and time.  ? ? ?Assessment & Plan:  ? ?1. Essential hypertension ?Readings much improved with compliance. Meds refilled. Continue present management and monitor ?- spironolactone (ALDACTONE) 25 MG tablet; Take 0.5 tablets (12.5 mg total) by mouth daily.  Dispense: 45 tablet; Refill: 1 ? ? ? ?Outpatient Encounter Medications as of 08/21/2021  ?Medication Sig  ? tamsulosin (FLOMAX) 0.4 MG CAPS capsule Take 1 capsule (0.4 mg total) by mouth daily.  ? [DISCONTINUED]  carvedilol (COREG) 12.5 MG tablet Take 1 tablet (12.5 mg total) by mouth 2 (two) times daily.  ? [DISCONTINUED] sacubitril-valsartan (ENTRESTO) 97-103 MG Take 1 tablet by mouth 2 (two) times daily.  ? [DISCONTINUED] spironolactone (ALDACTONE) 25 MG tablet Take 0.5 tablets (12.5 mg total) by mouth daily.  ? carvedilol (COREG) 12.5 MG tablet Take 1 tablet (12.5 mg total) by mouth 2 (two) times daily.  ? sacubitril-valsartan (ENTRESTO) 97-103 MG Take 1 tablet by mouth 2 (two) times daily.  ? spironolactone (ALDACTONE) 25 MG tablet Take 0.5 tablets (12.5 mg total) by mouth daily.  ? ?No facility-administered encounter medications on file as of 08/21/2021.  ? ? ?Follow-up: Return in about 6 months (around 02/20/2022) for physical.  ? ?Becky Sax, MD ? ?

## 2021-08-28 ENCOUNTER — Other Ambulatory Visit (HOSPITAL_COMMUNITY): Payer: Self-pay

## 2021-09-05 ENCOUNTER — Other Ambulatory Visit (HOSPITAL_COMMUNITY): Payer: Self-pay

## 2021-09-26 ENCOUNTER — Other Ambulatory Visit (HOSPITAL_COMMUNITY): Payer: Self-pay

## 2021-10-31 ENCOUNTER — Other Ambulatory Visit (HOSPITAL_COMMUNITY): Payer: Self-pay

## 2021-11-02 ENCOUNTER — Other Ambulatory Visit (HOSPITAL_COMMUNITY): Payer: Self-pay

## 2021-12-14 ENCOUNTER — Other Ambulatory Visit: Payer: Self-pay | Admitting: Nurse Practitioner

## 2021-12-14 DIAGNOSIS — N401 Enlarged prostate with lower urinary tract symptoms: Secondary | ICD-10-CM

## 2021-12-15 ENCOUNTER — Other Ambulatory Visit (HOSPITAL_COMMUNITY): Payer: Self-pay

## 2021-12-15 ENCOUNTER — Other Ambulatory Visit: Payer: Self-pay | Admitting: Nurse Practitioner

## 2021-12-15 DIAGNOSIS — N401 Enlarged prostate with lower urinary tract symptoms: Secondary | ICD-10-CM

## 2021-12-18 ENCOUNTER — Other Ambulatory Visit: Payer: Self-pay | Admitting: Nurse Practitioner

## 2021-12-18 ENCOUNTER — Other Ambulatory Visit (HOSPITAL_COMMUNITY): Payer: Self-pay

## 2021-12-18 DIAGNOSIS — N401 Enlarged prostate with lower urinary tract symptoms: Secondary | ICD-10-CM

## 2021-12-19 ENCOUNTER — Other Ambulatory Visit (HOSPITAL_COMMUNITY): Payer: Self-pay

## 2021-12-20 ENCOUNTER — Other Ambulatory Visit: Payer: Self-pay | Admitting: Nurse Practitioner

## 2021-12-20 ENCOUNTER — Other Ambulatory Visit (HOSPITAL_COMMUNITY): Payer: Self-pay

## 2021-12-20 DIAGNOSIS — N401 Enlarged prostate with lower urinary tract symptoms: Secondary | ICD-10-CM

## 2021-12-27 ENCOUNTER — Other Ambulatory Visit (HOSPITAL_COMMUNITY): Payer: Self-pay

## 2021-12-27 NOTE — Telephone Encounter (Signed)
Requested medication (s) are due for refill today:   Yes  Requested medication (s) are on the active medication list:   Yes  Future visit scheduled:   Yes with Dr. Redmond Pulling   Last ordered: 07/24/2021 #30, 3 refills  Returned because PSA is due per protocol.      Requested Prescriptions  Pending Prescriptions Disp Refills   tamsulosin (FLOMAX) 0.4 MG CAPS capsule 30 capsule 3    Sig: Take 1 capsule (0.4 mg total) by mouth daily.     Urology: Alpha-Adrenergic Blocker Failed - 12/27/2021  8:21 AM      Failed - PSA in normal range and within 360 days    Prostate Specific Ag, Serum  Date Value Ref Range Status  11/05/2019 0.7 0.0 - 4.0 ng/mL Final    Comment:    Roche ECLIA methodology. According to the American Urological Association, Serum PSA should decrease and remain at undetectable levels after radical prostatectomy. The AUA defines biochemical recurrence as an initial PSA value 0.2 ng/mL or greater followed by a subsequent confirmatory PSA value 0.2 ng/mL or greater. Values obtained with different assay methods or kits cannot be used interchangeably. Results cannot be interpreted as absolute evidence of the presence or absence of malignant disease.          Passed - Last BP in normal range    BP Readings from Last 1 Encounters:  08/21/21 113/77         Passed - Valid encounter within last 12 months    Recent Outpatient Visits           4 months ago Essential hypertension   [facility], MD   5 months ago Essential hypertension   [facility] at [facility], Kriste Basque, NP   10 months ago Benign prostatic hyperplasia with nocturia   Primary Care at [facility], Kriste Basque, NP   1 year ago Benign prostatic hyperplasia with nocturia   [facility] at [facility], [place], NP   2 years ago Annual physical exam   [facility] at [facility], Bayard Beaver, MD       Future Appointments              In 1 month Dorna Mai, MD [facility] at [facility]

## 2021-12-27 NOTE — Telephone Encounter (Signed)
Medication Refill - Medication:tamsulosin (FLOMAX) 0.4 MG CAPS capsule  Has the patient contacted their pharmacy? Yes.     Preferred Pharmacy (with phone number or street name):  [facility] Phone:  [phone]  Fax:  [phone]     Has the patient been seen for an appointment in the last year OR does the patient have an upcoming appointment? Yes.    Patient states he is currently out of medication.

## 2022-01-10 ENCOUNTER — Other Ambulatory Visit (HOSPITAL_COMMUNITY): Payer: Self-pay

## 2022-02-14 ENCOUNTER — Ambulatory Visit
Admission: EM | Admit: 2022-02-14 | Discharge: 2022-02-14 | Disposition: A | Discharge status: Home / Self Care | Payer: Medicaid Other

## 2022-02-14 ENCOUNTER — Emergency Department (HOSPITAL_BASED_OUTPATIENT_CLINIC_OR_DEPARTMENT_OTHER): Payer: Medicaid Other

## 2022-02-14 ENCOUNTER — Encounter: Payer: Self-pay | Admitting: Emergency Medicine

## 2022-02-14 ENCOUNTER — Encounter (HOSPITAL_COMMUNITY): Payer: Self-pay

## 2022-02-14 ENCOUNTER — Inpatient Hospital Stay (HOSPITAL_BASED_OUTPATIENT_CLINIC_OR_DEPARTMENT_OTHER)
Admission: EM | Admit: 2022-02-14 | Discharge: 2022-02-18 | DRG: 438 | Disposition: A | Discharge status: Home / Self Care | Payer: Medicaid Other | Attending: Internal Medicine | Admitting: Internal Medicine

## 2022-02-14 ENCOUNTER — Encounter (HOSPITAL_BASED_OUTPATIENT_CLINIC_OR_DEPARTMENT_OTHER): Payer: Self-pay

## 2022-02-14 ENCOUNTER — Other Ambulatory Visit: Payer: Self-pay

## 2022-02-14 DIAGNOSIS — Z833 Family history of diabetes mellitus: Secondary | ICD-10-CM

## 2022-02-14 DIAGNOSIS — F10139 Alcohol abuse with withdrawal, unspecified: Secondary | ICD-10-CM | POA: Diagnosis present

## 2022-02-14 DIAGNOSIS — K920 Hematemesis: Secondary | ICD-10-CM

## 2022-02-14 DIAGNOSIS — K7030 Alcoholic cirrhosis of liver without ascites: Secondary | ICD-10-CM | POA: Diagnosis present

## 2022-02-14 DIAGNOSIS — D696 Thrombocytopenia, unspecified: Secondary | ICD-10-CM | POA: Diagnosis present

## 2022-02-14 DIAGNOSIS — K760 Fatty (change of) liver, not elsewhere classified: Secondary | ICD-10-CM | POA: Diagnosis present

## 2022-02-14 DIAGNOSIS — R351 Nocturia: Secondary | ICD-10-CM | POA: Diagnosis present

## 2022-02-14 DIAGNOSIS — E860 Dehydration: Secondary | ICD-10-CM | POA: Diagnosis present

## 2022-02-14 DIAGNOSIS — I110 Hypertensive heart disease with heart failure: Secondary | ICD-10-CM | POA: Diagnosis present

## 2022-02-14 DIAGNOSIS — K279 Peptic ulcer, site unspecified, unspecified as acute or chronic, without hemorrhage or perforation: Secondary | ICD-10-CM

## 2022-02-14 DIAGNOSIS — K852 Alcohol induced acute pancreatitis without necrosis or infection: Principal | ICD-10-CM | POA: Diagnosis present

## 2022-02-14 DIAGNOSIS — Z86010 Personal history of colonic polyps: Secondary | ICD-10-CM

## 2022-02-14 DIAGNOSIS — R1084 Generalized abdominal pain: Secondary | ICD-10-CM | POA: Diagnosis not present

## 2022-02-14 DIAGNOSIS — D492 Neoplasm of unspecified behavior of bone, soft tissue, and skin: Secondary | ICD-10-CM | POA: Diagnosis present

## 2022-02-14 DIAGNOSIS — M899 Disorder of bone, unspecified: Secondary | ICD-10-CM

## 2022-02-14 DIAGNOSIS — K2980 Duodenitis without bleeding: Secondary | ICD-10-CM | POA: Diagnosis present

## 2022-02-14 DIAGNOSIS — K7460 Unspecified cirrhosis of liver: Secondary | ICD-10-CM

## 2022-02-14 DIAGNOSIS — F32A Depression, unspecified: Secondary | ICD-10-CM | POA: Diagnosis present

## 2022-02-14 DIAGNOSIS — K859 Acute pancreatitis without necrosis or infection, unspecified: Secondary | ICD-10-CM | POA: Diagnosis present

## 2022-02-14 DIAGNOSIS — K2971 Gastritis, unspecified, with bleeding: Secondary | ICD-10-CM | POA: Diagnosis present

## 2022-02-14 DIAGNOSIS — Z79899 Other long term (current) drug therapy: Secondary | ICD-10-CM

## 2022-02-14 DIAGNOSIS — R17 Unspecified jaundice: Secondary | ICD-10-CM

## 2022-02-14 DIAGNOSIS — I5022 Chronic systolic (congestive) heart failure: Secondary | ICD-10-CM | POA: Diagnosis present

## 2022-02-14 DIAGNOSIS — K449 Diaphragmatic hernia without obstruction or gangrene: Secondary | ICD-10-CM | POA: Diagnosis present

## 2022-02-14 DIAGNOSIS — E876 Hypokalemia: Secondary | ICD-10-CM | POA: Diagnosis present

## 2022-02-14 DIAGNOSIS — E878 Other disorders of electrolyte and fluid balance, not elsewhere classified: Secondary | ICD-10-CM

## 2022-02-14 DIAGNOSIS — K2970 Gastritis, unspecified, without bleeding: Secondary | ICD-10-CM

## 2022-02-14 DIAGNOSIS — I10 Essential (primary) hypertension: Secondary | ICD-10-CM

## 2022-02-14 DIAGNOSIS — F1010 Alcohol abuse, uncomplicated: Secondary | ICD-10-CM | POA: Diagnosis present

## 2022-02-14 DIAGNOSIS — E871 Hypo-osmolality and hyponatremia: Secondary | ICD-10-CM | POA: Diagnosis present

## 2022-02-14 DIAGNOSIS — R7989 Other specified abnormal findings of blood chemistry: Secondary | ICD-10-CM

## 2022-02-14 DIAGNOSIS — Z8249 Family history of ischemic heart disease and other diseases of the circulatory system: Secondary | ICD-10-CM

## 2022-02-14 DIAGNOSIS — N401 Enlarged prostate with lower urinary tract symptoms: Secondary | ICD-10-CM | POA: Diagnosis present

## 2022-02-14 LAB — COMPREHENSIVE METABOLIC PANEL
ALT: 30 U/L (ref 0–44)
AST: 121 U/L — ABNORMAL HIGH (ref 15–41)
Albumin: 4.3 g/dL (ref 3.5–5.0)
Alkaline Phosphatase: 75 U/L (ref 38–126)
Anion gap: 20 — ABNORMAL HIGH (ref 5–15)
BUN: 7 mg/dL (ref 6–20)
CO2: 26 mmol/L (ref 22–32)
Calcium: 10 mg/dL (ref 8.9–10.3)
Chloride: 87 mmol/L — ABNORMAL LOW (ref 98–111)
Creatinine, Ser: 0.78 mg/dL (ref 0.61–1.24)
GFR, Estimated: >60 mL/min (ref >60)
Glucose, Bld: 129 mg/dL — ABNORMAL HIGH (ref 70–99)
Potassium: 3 mmol/L — ABNORMAL LOW (ref 3.5–5.1)
Sodium: 133 mmol/L — ABNORMAL LOW (ref 135–145)
Total Bilirubin: 2.3 mg/dL — ABNORMAL HIGH (ref 0.3–1.2)
Total Protein: 9.3 g/dL — ABNORMAL HIGH (ref 6.5–8.1)

## 2022-02-14 LAB — URINALYSIS, ROUTINE W REFLEX MICROSCOPIC
Glucose, UA: NEGATIVE mg/dL
Ketones, ur: >80 mg/dL — AB
Leukocytes,Ua: NEGATIVE
Nitrite: NEGATIVE
Protein, ur: 30 mg/dL — AB
Specific Gravity, Urine: 1.029 (ref 1.005–1.030)
pH: 6.5 (ref 5.0–8.0)

## 2022-02-14 LAB — CBC
HCT: 42.6 % (ref 39.0–52.0)
Hemoglobin: 14.9 g/dL (ref 13.0–17.0)
MCH: 36.2 pg — ABNORMAL HIGH (ref 26.0–34.0)
MCHC: 35 g/dL (ref 30.0–36.0)
MCV: 103.4 fL — ABNORMAL HIGH (ref 80.0–100.0)
Platelets: 101 10*3/uL — ABNORMAL LOW (ref 150–400)
RBC: 4.12 MIL/uL — ABNORMAL LOW (ref 4.22–5.81)
RDW: 12.6 % (ref 11.5–15.5)
WBC: 6.4 10*3/uL (ref 4.0–10.5)
nRBC: 0 % (ref 0.0–0.2)

## 2022-02-14 LAB — LIPASE, BLOOD: Lipase: 278 U/L — ABNORMAL HIGH (ref 11–51)

## 2022-02-14 MED ORDER — ONDANSETRON HCL 4 MG/2ML IJ SOLN
4.0000 mg | Freq: Once | INTRAMUSCULAR | Status: AC
  Administered 2022-02-14: 4 mg via INTRAVENOUS
  Filled 2022-02-14: qty 2

## 2022-02-14 MED ORDER — IOHEXOL 300 MG/ML  SOLN
80.0000 mL | Freq: Once | INTRAMUSCULAR | Status: AC | PRN
  Administered 2022-02-14: 80 mL via INTRAVENOUS

## 2022-02-14 MED ORDER — PANTOPRAZOLE SODIUM 40 MG IV SOLR
INTRAVENOUS | Status: AC
  Administered 2022-02-14: 80 mg
  Filled 2022-02-14: qty 20

## 2022-02-14 MED ORDER — SUCRALFATE 1 GM/10ML PO SUSP
1.0000 g | Freq: Once | ORAL | Status: AC
  Administered 2022-02-14: 1 g via ORAL
  Filled 2022-02-14: qty 10

## 2022-02-14 MED ORDER — LACTATED RINGERS IV BOLUS
1000.0000 mL | Freq: Once | INTRAVENOUS | Status: AC
  Administered 2022-02-14: 1000 mL via INTRAVENOUS

## 2022-02-14 MED ORDER — MORPHINE SULFATE (PF) 4 MG/ML IV SOLN
8.0000 mg | Freq: Once | INTRAVENOUS | Status: AC
  Administered 2022-02-14: 8 mg via INTRAVENOUS
  Filled 2022-02-14: qty 2

## 2022-02-14 MED ORDER — FENTANYL CITRATE PF 50 MCG/ML IJ SOSY
100.0000 ug | PREFILLED_SYRINGE | INTRAMUSCULAR | Status: DC | PRN
  Administered 2022-02-14: 100 ug via INTRAVENOUS
  Filled 2022-02-14: qty 2

## 2022-02-14 MED ORDER — PANTOPRAZOLE SODIUM 40 MG IV SOLR
40.0000 mg | Freq: Two times a day (BID) | INTRAVENOUS | Status: DC
  Administered 2022-02-15 – 2022-02-17 (×6): 40 mg via INTRAVENOUS
  Filled 2022-02-14 (×6): qty 10

## 2022-02-14 MED ORDER — PANTOPRAZOLE 80MG IVPB - SIMPLE MED
80.0000 mg | Freq: Once | INTRAVENOUS | Status: DC
  Filled 2022-02-14: qty 100

## 2022-02-14 NOTE — Discharge Instructions (Signed)
Please go to the emergency department as soon as you leave urgent care for further evaluation and management. ?

## 2022-02-14 NOTE — ED Triage Notes (Addendum)
Pt is present today with LLQ abdominal pain and vomiting black liquid. Pt states that his sx started x3 days ago. Pt denies any diarrhea or constipation

## 2022-02-14 NOTE — ED Notes (Signed)
Patient requests urinal to void at beside. Urinal provided, denied further assistance from nursing at this time. Call ball in reach.

## 2022-02-14 NOTE — ED Provider Notes (Signed)
[facility] EMERG[facility] DEPT Provider Note   CSN: [id number] Arrival date & time: 02/14/22  1527     History  Chief Complaint  Patient presents with   Abdominal Pain    [name] [name] is a 51 y.o. male.  HPI     51 year old male comes in with chief complaint of abdominal pain, nausea and vomiting.  Patient has history of CHF, alcohol abuse, documented history of prior cocaine use.  He has no history of abdominal surgery.  Patient states that he has been having abdominal pain for the last 4 to 5 days.  Pain is located in the lower part of the abdomen and right side.  Pain is nonradiating.  He has associated nausea and vomiting, vomiting was worse yesterday when he had 20+ episodes of emesis.  Last emesis was this morning.  He has seen dark stool as well.  Patient denies any bloody stool or bloody emesis.  He states that he drinks 3 or 4 days a week and usually drinks 3 or 4 beers, sometimes liquor.  Denies any current illicit substance use.  Home Medications Prior to Admission medications   Medication Sig Start Date End Date Taking? Authorizing Provider  carvedilol (COREG) 12.5 MG tablet Take 1 tablet (12.5 mg total) by mouth 2 (two) times daily. 08/21/21   Dorna Mai, MD  sacubitril-valsartan (ENTRESTO) 97-103 MG Take 1 tablet by mouth 2 (two) times daily. 08/21/21   Dorna Mai, MD  spironolactone (ALDACTONE) 25 MG tablet Take 0.5 tablets (12.5 mg total) by mouth daily. 08/21/21   Dorna Mai, MD  tamsulosin (FLOMAX) 0.4 MG CAPS capsule Take 1 capsule (0.4 mg total) by mouth daily. 07/24/21   Fenton Foy, NP      Allergies    Patient has no known allergies.    Review of Systems   Review of Systems  All other systems reviewed and are negative.   Physical Exam Updated Vital Signs BP 134/77   Pulse 86   Temp 99.2 F (37.3 C) (Oral)   Resp 14   Ht 6' 3"  (1.905 m)   Wt 95.3 kg   SpO2 96%   BMI 26.26 kg/m  Physical Exam Vitals and nursing note  reviewed.  Constitutional:      Appearance: He is well-developed.  HENT:     Head: Atraumatic.  Cardiovascular:     Rate and Rhythm: Normal rate.  Pulmonary:     Effort: Pulmonary effort is normal.  Abdominal:     Tenderness: There is abdominal tenderness in the right lower quadrant, epigastric area and periumbilical area.  Musculoskeletal:     Cervical back: Neck supple.  Skin:    General: Skin is warm.  Neurological:     Mental Status: He is alert and oriented to person, place, and time.     ED Results / Procedures / Treatments   Labs (all labs ordered are listed, but only abnormal results are displayed) Labs Reviewed  LIPASE, BLOOD - Abnormal; Notable for the following components:      Result Value   Lipase 278 (*)    All other components within normal limits  COMPREHENSIVE METABOLIC PANEL - Abnormal; Notable for the following components:   Sodium 133 (*)    Potassium 3.0 (*)    Chloride 87 (*)    Glucose, Bld 129 (*)    Total Protein 9.3 (*)    AST 121 (*)    Total Bilirubin 2.3 (*)    Anion gap 20 (*)  All other components within normal limits  CBC - Abnormal; Notable for the following components:   RBC 4.12 (*)    MCV 103.4 (*)    MCH 36.2 (*)    Platelets 101 (*)    All other components within normal limits  URINALYSIS, ROUTINE W REFLEX MICROSCOPIC - Abnormal; Notable for the following components:   Hgb urine dipstick TRACE (*)    Bilirubin Urine MODERATE (*)    Ketones, ur >80 (*)    Protein, ur 30 (*)    All other components within normal limits    EKG None  Radiology CT ABDOMEN PELVIS W CONTRAST  Result Date: 02/14/2022 CLINICAL DATA:  Nausea vomiting elevated lipase EXAM: CT ABDOMEN AND PELVIS WITH CONTRAST TECHNIQUE: Multidetector CT imaging of the abdomen and pelvis was performed using the standard protocol following bolus administration of intravenous contrast. RADIATION DOSE REDUCTION: This exam was performed according to the departmental  dose-optimization program which includes automated exposure control, adjustment of the mA and/or kV according to patient size and/or use of iterative reconstruction technique. CONTRAST:  75m OMNIPAQUE IOHEXOL 300 MG/ML  SOLN COMPARISON:  CT 08/20/2016 FINDINGS: Lower chest: Lung bases demonstrate no acute airspace disease. Hepatobiliary: Hepatic steatosis. Subtle surface nodularity suspicious for cirrhosis. No calcified gallstone or biliary dilatation Pancreas: Inflammatory changes at the pancreaticoduodenal groove with trace fluid and inflammation in the right anterior pararenal space. Spleen: Normal in size without focal abnormality. Adrenals/Urinary Tract: Adrenal glands are normal. No hydronephrosis. Chronic peripherally calcified benign-appearing lesion superior pole left kidney, no imaging follow-up is recommended. The bladder is unremarkable Stomach/Bowel: The stomach is nonenlarged. No dilated small bowel. Negative appendix. Vascular/Lymphatic: Moderate aortic atherosclerosis. No aneurysm. No suspicious lymph nodes Reproductive: Prostate is unremarkable. Other: Negative for pelvic effusion or free air. Musculoskeletal: No acute osseous abnormality. Lucent circumscribed lesions within the L3, L4 and L5 vertebral bodies. IMPRESSION: 1. Mild inflammatory changes surrounding the pancreas and duodenum suspicious for pancreatitis/duodenitis. No organized fluid collection 2. Hepatic steatosis with findings suggestive of cirrhosis 3. Lucent circumscribed lesions in the L3, L4 and L5 vertebral bodies suggestive of benign process such as hemangioma but not definitive. Consider correlation with nonemergent MRI. Electronically Signed   By: KDonavan FoilM.D.   On: 02/14/2022 20:00    Procedures .Critical Care  Performed by: NVarney Biles MD Authorized by: NVarney Biles MD   Critical care provider statement:    Critical care time (minutes):  30   Critical care was necessary to treat or prevent imminent or  life-threatening deterioration of the following conditions:  Dehydration   Critical care was time spent personally by me on the following activities:  Development of treatment plan with patient or surrogate, discussions with consultants, evaluation of patient's response to treatment, examination of patient, ordering and review of laboratory studies, ordering and review of radiographic studies, ordering and performing treatments and interventions, pulse oximetry, re-evaluation of patient's condition and review of old charts     Medications Ordered in ED Medications  pantoprazole (PROTONIX) 80 mg /NS 100 mL IVPB (80 mg Intravenous Not Given 02/14/22 2156)  pantoprazole (PROTONIX) injection 40 mg (has no administration in time range)  fentaNYL (SUBLIMAZE) injection 100 mcg (has no administration in time range)  lactated ringers bolus 1,000 mL (0 mLs Intravenous Stopped 02/14/22 2030)  morphine (PF) 4 MG/ML injection 8 mg (8 mg Intravenous Given 02/14/22 1929)  ondansetron (ZOFRAN) injection 4 mg (4 mg Intravenous Given 02/14/22 1929)  iohexol (OMNIPAQUE) 300 MG/ML solution 80 mL (80  mLs Intravenous Contrast Given 02/14/22 1942)  pantoprazole (PROTONIX) 40 MG injection (80 mg  Given 02/14/22 2142)  lactated ringers bolus 1,000 mL (1,000 mLs Intravenous New Bag/Given 02/14/22 2142)  sucralfate (CARAFATE) 1 GM/10ML suspension 1 g (1 g Oral Given 02/14/22 2142)    ED Course/ Medical Decision Making/ A&P Clinical Course as of 02/14/22 2217  Wed Feb 14, 2022  2204 CT ABDOMEN PELVIS W CONTRAST Continues to have severe abdominal pain.  Has evidence of duodenitis.  He has anion gap, ketonuria, severe dehydration, elevated bilirubin.  We will admit him for symptom management. [AN]    Clinical Course User Index [AN] Varney Biles, MD                           Medical Decision Making Amount and/or Complexity of Data Reviewed Labs: ordered. Radiology: ordered. Decision-making details documented in ED  Course.  Risk Prescription drug management. Decision regarding hospitalization.   51 year old male comes in with chief complaint of abdominal pain, severe nausea and vomiting.  Patient has history of congestive heart failure and documented history of alcohol use disorder and previous cocaine use.  Differential diagnosis for his abdominal pain includes pancreatitis, peptic ulcer disease, choledocholithiasis, perforated viscus, intra-abdominal abscess.  Patient's labs independently interpreted.  Lipase is elevated over 200.  Patient's bilirubin is 2.3.  His sclera is icteric.  Alk phos is reassuring.  CT abdomen pelvis ordered.  10:17 PM CT scan shows duodenitis/pancreatitis type picture. Clinical suspicion is high for duodenitis.  10:17 PM Reassessed.  Continues to have severe pain.  Wants to try to drink water, but so far not successful.  We will proceed with admission request.  Final Clinical Impression(s) / ED Diagnoses Final diagnoses:  Severe dehydration  Hyperbilirubinemia  Narrow anion gap  PUD (peptic ulcer disease)    Rx / DC Orders ED Discharge Orders     None         Varney Biles, MD 02/14/22 2217

## 2022-02-14 NOTE — ED Triage Notes (Signed)
Patient here POV from UC.  Endorses LLQ ABD Pain for approximately 3 Days. Also endorses Emesis with Dark Coloration to it for approximately 2 Days.   No Confirmed Fevers. No Discernable Nausea or Diarrhea. Does Endorses 20 Episodes of Emesis Last PM.   Sent from UC for Evaluation.  NAD Noted during Triage. A&Ox4. GCS 15. Ambulatory.

## 2022-02-14 NOTE — ED Provider Notes (Addendum)
[facility]    CSN: [id number] Arrival date & time: 02/14/22  1409      History   Chief Complaint Chief Complaint  Patient presents with   Abdominal Pain   Emesis    HPI [name] [name] is a 51 y.o. male.   Patient presents with abdominal pain and nausea with vomiting that started about 4 days ago.  Patient reports that he has abdominal pain in the lower portion of the abdomen as well as in the left upper quadrant.  Patient reports that he has had black-colored vomit with occasional bright red blood over the past 2 days.  Denies any associated diarrhea.  Last bowel movement was this morning and denies blood in stool.  Denies any associated fever.  Patient also has yellow discolored eyes.  Patient states that he drinks 3-4 beers a day and has been doing this for a few months.  Although, patient reports that he has not drink alcohol in about 3 to 4 days.  He has been drinking clear fluids.  Patient states abdominal pain is a constant burning sensation and rates it 8/10 on pain scale   Abdominal Pain Emesis   Past Medical History:  Diagnosis Date   COLONIC POLYPS, BENIGN 01/20/2010   Annotation: 12/2009: path benign; rpt 2016 Qualifier: Diagnosis of  By: Jorene Minors, [place]     Hypertension     Patient Active Problem List   Diagnosis Date Noted   Encounter for orthopedic follow-up care 07/25/2020   Ganglion cyst 06/22/2020   Benign prostatic hyperplasia with nocturia 98/92/1194   Chronic systolic CHF (congestive heart failure) ([place]) 01/23/2017   Non-ischemic cardiomyopathy ([place]) 09/19/2016   HLD (hyperlipidemia) 09/19/2016   ETOH abuse 09/19/2016   Moderate mitral regurgitation 09/05/2016   Cocaine abuse ([place]) 08/23/2016   Positive D dimer 08/23/2016   Macrocytosis 08/23/2016   Alcohol use 08/20/2016   Major depressive disorder, recurrent severe without psychotic features ([place]) 08/13/2014   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 Authorizing Provider  carvedilol (COREG) 12.5 MG tablet Take 1 tablet (12.5 mg total) by mouth 2 (two) times daily. 08/21/21   Dorna Mai, MD  sacubitril-valsartan (ENTRESTO) 97-103 MG Take 1 tablet by mouth 2 (two) times daily. 08/21/21   Dorna Mai, MD  spironolactone (ALDACTONE) 25 MG tablet Take 0.5 tablets (12.5 mg total) by mouth daily. 08/21/21   Dorna Mai, MD  tamsulosin (FLOMAX) 0.4 MG CAPS capsule Take 1 capsule (0.4 mg total) by mouth daily. 07/24/21   Fenton Foy, NP    Family History Family History  Problem Relation Age of Onset   Diabetes Other    Hypertension Other     Social History Social History   Tobacco Use   Smoking status: Never   Smokeless tobacco: Never  Substance Use Topics   Alcohol use: Yes    Comment: (2) 24 oz beers every other day pt reports   Drug use: Yes    Types: Marijuana    Comment: pt reports marijuana use occasionally  Allergies   Patient has no known allergies.   Review of Systems Review of Systems Per HPI  Physical Exam Triage Vital Signs ED Triage Vitals  Enc Vitals Group     BP 02/14/22 1427 138/89     Pulse Rate 02/14/22 1427 (!) 112     Resp 02/14/22 1427 19     Temp 02/14/22 1427 98.2 F (36.8 C)     Temp src --      SpO2 02/14/22 1427 97 %     Weight --      Height --      Head Circumference --      Peak Flow --      Pain Score 02/14/22 1426 8     Pain Loc --      Pain Edu? --      Excl. in [place]? --    No data found.  Updated Vital Signs BP 138/89   Pulse (!) 112   Temp 98.2 F (36.8 C)    Resp 19   SpO2 97%   Visual Acuity Right Eye Distance:   Left Eye Distance:   Bilateral Distance:    Right Eye Near:   Left Eye Near:    Bilateral Near:     Physical Exam Constitutional:      General: He is not in acute distress.    Appearance: Normal appearance. He is not toxic-appearing or diaphoretic.  HENT:     Head: Normocephalic and atraumatic.  Eyes:     General: Scleral icterus present.     Extraocular Movements: Extraocular movements intact.     Conjunctiva/sclera: Conjunctivae normal.  Cardiovascular:     Rate and Rhythm: Regular rhythm. Tachycardia present.     Pulses: Normal pulses.     Heart sounds: Normal heart sounds.  Pulmonary:     Effort: Pulmonary effort is normal. No respiratory distress.     Breath sounds: Normal breath sounds.  Abdominal:     General: Bowel sounds are normal. There is no distension.     Palpations: Abdomen is soft.     Tenderness: There is abdominal tenderness. There is guarding.     Comments: Patient is exquisitely tender throughout palpation of abdomen.  Neurological:     General: No focal deficit present.     Mental Status: He is alert and oriented to person, place, and time. Mental status is at baseline.  Psychiatric:        Mood and Affect: Mood normal.        Behavior: Behavior normal.        Thought Content: Thought content normal.        Judgment: Judgment normal.      UC Treatments / Results  Labs (all labs ordered are listed, but only abnormal results are displayed) Labs Reviewed - No data to display  EKG   Radiology No results found.  Procedures Procedures (including critical care time)  Medications Ordered in UC Medications - No data to display  Initial Impression / Assessment and Plan / UC Course  I have reviewed the triage vital signs and the nursing notes.  Pertinent labs & imaging results that were available during my care of the patient were reviewed by me and considered in my medical decision  making (see chart for details).     Patient has scleral icterus along with severe abdominal pain and hemataemesis.  With associated mild tachycardia, I do think patient necessitates further evaluation and management at the hospital given limited  resources here in urgent care as there is concern for worrisome etiologies and dehydration..  Advised patient to go to the emergency department for further evaluation and management.  Patient was agreeable with plan.  Vital signs stable at discharge.  Agree with patient self transport to the hospital. Final Clinical Impressions(s) / UC Diagnoses   Final diagnoses:  Generalized abdominal pain  Hematemesis with nausea  Scleral icterus     Discharge Instructions      Please go to the emergency department as soon as you leave urgent care for further evaluation and management.    ED Prescriptions   None    PDMP not reviewed this encounter.   Teodora Medici, [place] 02/14/22 [place], [place], [place] 02/14/22 1446

## 2022-02-14 NOTE — ED Notes (Signed)
Patient is being discharged from the Urgent Care and sent to the Emergency Department via POV . Per Oswaldo Conroy NP, patient is in need of higher level of care due to possible jaundice, abdominal pain, and black emesis. Patient is aware and verbalizes understanding of plan of care.  Vitals:   02/14/22 1427  BP: 138/89  Pulse: (!) 112  Resp: 19  Temp: 98.2 F (36.8 C)  SpO2: 97%

## 2022-02-15 ENCOUNTER — Observation Stay (HOSPITAL_COMMUNITY): Payer: Medicaid Other

## 2022-02-15 DIAGNOSIS — R7989 Other specified abnormal findings of blood chemistry: Secondary | ICD-10-CM

## 2022-02-15 DIAGNOSIS — E871 Hypo-osmolality and hyponatremia: Secondary | ICD-10-CM | POA: Diagnosis not present

## 2022-02-15 DIAGNOSIS — R932 Abnormal findings on diagnostic imaging of liver and biliary tract: Secondary | ICD-10-CM

## 2022-02-15 DIAGNOSIS — F1090 Alcohol use, unspecified, uncomplicated: Secondary | ICD-10-CM

## 2022-02-15 DIAGNOSIS — K449 Diaphragmatic hernia without obstruction or gangrene: Secondary | ICD-10-CM | POA: Diagnosis not present

## 2022-02-15 DIAGNOSIS — E860 Dehydration: Secondary | ICD-10-CM | POA: Diagnosis not present

## 2022-02-15 DIAGNOSIS — K2971 Gastritis, unspecified, with bleeding: Secondary | ICD-10-CM | POA: Diagnosis not present

## 2022-02-15 DIAGNOSIS — F32A Depression, unspecified: Secondary | ICD-10-CM | POA: Diagnosis not present

## 2022-02-15 DIAGNOSIS — I110 Hypertensive heart disease with heart failure: Secondary | ICD-10-CM | POA: Diagnosis not present

## 2022-02-15 DIAGNOSIS — K2980 Duodenitis without bleeding: Secondary | ICD-10-CM

## 2022-02-15 DIAGNOSIS — N401 Enlarged prostate with lower urinary tract symptoms: Secondary | ICD-10-CM | POA: Diagnosis not present

## 2022-02-15 DIAGNOSIS — K7030 Alcoholic cirrhosis of liver without ascites: Secondary | ICD-10-CM | POA: Diagnosis not present

## 2022-02-15 DIAGNOSIS — K852 Alcohol induced acute pancreatitis without necrosis or infection: Secondary | ICD-10-CM | POA: Diagnosis not present

## 2022-02-15 DIAGNOSIS — M899 Disorder of bone, unspecified: Secondary | ICD-10-CM

## 2022-02-15 DIAGNOSIS — Z86010 Personal history of colonic polyps: Secondary | ICD-10-CM | POA: Diagnosis not present

## 2022-02-15 DIAGNOSIS — E876 Hypokalemia: Secondary | ICD-10-CM | POA: Diagnosis not present

## 2022-02-15 DIAGNOSIS — D492 Neoplasm of unspecified behavior of bone, soft tissue, and skin: Secondary | ICD-10-CM | POA: Diagnosis not present

## 2022-02-15 DIAGNOSIS — I5022 Chronic systolic (congestive) heart failure: Secondary | ICD-10-CM | POA: Diagnosis not present

## 2022-02-15 DIAGNOSIS — R351 Nocturia: Secondary | ICD-10-CM | POA: Diagnosis not present

## 2022-02-15 DIAGNOSIS — D696 Thrombocytopenia, unspecified: Secondary | ICD-10-CM | POA: Diagnosis not present

## 2022-02-15 DIAGNOSIS — F10139 Alcohol abuse with withdrawal, unspecified: Secondary | ICD-10-CM | POA: Diagnosis not present

## 2022-02-15 DIAGNOSIS — K7460 Unspecified cirrhosis of liver: Secondary | ICD-10-CM

## 2022-02-15 DIAGNOSIS — K769 Liver disease, unspecified: Secondary | ICD-10-CM

## 2022-02-15 DIAGNOSIS — Z79899 Other long term (current) drug therapy: Secondary | ICD-10-CM | POA: Diagnosis not present

## 2022-02-15 LAB — CBC WITH DIFFERENTIAL/PLATELET
Abs Immature Granulocytes: 0.01 10*3/uL (ref 0.00–0.07)
Basophils Absolute: 0 10*3/uL (ref 0.0–0.1)
Basophils Relative: 1 %
Eosinophils Absolute: 0.1 10*3/uL (ref 0.0–0.5)
Eosinophils Relative: 2 %
HCT: 39 % (ref 39.0–52.0)
Hemoglobin: 13.3 g/dL (ref 13.0–17.0)
Immature Granulocytes: 0 %
Lymphocytes Relative: 31 %
Lymphs Abs: 1.5 10*3/uL (ref 0.7–4.0)
MCH: 36.3 pg — ABNORMAL HIGH (ref 26.0–34.0)
MCHC: 34.1 g/dL (ref 30.0–36.0)
MCV: 106.6 fL — ABNORMAL HIGH (ref 80.0–100.0)
Monocytes Absolute: 0.5 10*3/uL (ref 0.1–1.0)
Monocytes Relative: 11 %
Neutro Abs: 2.6 10*3/uL (ref 1.7–7.7)
Neutrophils Relative %: 55 %
Platelets: 92 10*3/uL — ABNORMAL LOW (ref 150–400)
RBC: 3.66 MIL/uL — ABNORMAL LOW (ref 4.22–5.81)
RDW: 12.4 % (ref 11.5–15.5)
WBC: 4.7 10*3/uL (ref 4.0–10.5)
nRBC: 0 % (ref 0.0–0.2)

## 2022-02-15 LAB — COMPREHENSIVE METABOLIC PANEL
ALT: 31 U/L (ref 0–44)
AST: 114 U/L — ABNORMAL HIGH (ref 15–41)
Albumin: 3.1 g/dL — ABNORMAL LOW (ref 3.5–5.0)
Alkaline Phosphatase: 63 U/L (ref 38–126)
Anion gap: 10 (ref 5–15)
BUN: 5 mg/dL — ABNORMAL LOW (ref 6–20)
CO2: 32 mmol/L (ref 22–32)
Calcium: 8.4 mg/dL — ABNORMAL LOW (ref 8.9–10.3)
Chloride: 92 mmol/L — ABNORMAL LOW (ref 98–111)
Creatinine, Ser: 0.71 mg/dL (ref 0.61–1.24)
GFR, Estimated: >60 mL/min (ref >60)
Glucose, Bld: 91 mg/dL (ref 70–99)
Potassium: 3.4 mmol/L — ABNORMAL LOW (ref 3.5–5.1)
Sodium: 134 mmol/L — ABNORMAL LOW (ref 135–145)
Total Bilirubin: 2.4 mg/dL — ABNORMAL HIGH (ref 0.3–1.2)
Total Protein: 7.3 g/dL (ref 6.5–8.1)

## 2022-02-15 LAB — MAGNESIUM: Magnesium: 1.4 mg/dL — ABNORMAL LOW (ref 1.7–2.4)

## 2022-02-15 MED ORDER — ONDANSETRON HCL 4 MG/2ML IJ SOLN: 4.0000 mg | Freq: Four times a day (QID) | INTRAMUSCULAR | Status: DC | PRN

## 2022-02-15 MED ORDER — MORPHINE SULFATE (PF) 4 MG/ML IV SOLN
4.0000 mg | Freq: Once | INTRAVENOUS | Status: AC
  Administered 2022-02-15: 4 mg via INTRAVENOUS
  Filled 2022-02-15: qty 1

## 2022-02-15 MED ORDER — ONDANSETRON HCL 4 MG PO TABS: 4.0000 mg | ORAL_TABLET | Freq: Four times a day (QID) | ORAL | Status: DC | PRN

## 2022-02-15 MED ORDER — THIAMINE MONONITRATE 100 MG PO TABS
100.0000 mg | ORAL_TABLET | Freq: Every day | ORAL | Status: DC
  Administered 2022-02-15 – 2022-02-18 (×4): 100 mg via ORAL
  Filled 2022-02-15 (×4): qty 1

## 2022-02-15 MED ORDER — FOLIC ACID 1 MG PO TABS
1.0000 mg | ORAL_TABLET | Freq: Every day | ORAL | Status: DC
  Administered 2022-02-15 – 2022-02-18 (×4): 1 mg via ORAL
  Filled 2022-02-15 (×4): qty 1

## 2022-02-15 MED ORDER — MELATONIN 3 MG PO TABS
3.0000 mg | ORAL_TABLET | Freq: Every day | ORAL | Status: AC
  Administered 2022-02-15: 3 mg via ORAL
  Filled 2022-02-15: qty 1

## 2022-02-15 MED ORDER — POTASSIUM CHLORIDE CRYS ER 20 MEQ PO TBCR
40.0000 meq | EXTENDED_RELEASE_TABLET | Freq: Every day | ORAL | Status: DC
  Administered 2022-02-15: 40 meq via ORAL
  Filled 2022-02-15: qty 2

## 2022-02-15 MED ORDER — SACUBITRIL-VALSARTAN 97-103 MG PO TABS
1.0000 | ORAL_TABLET | Freq: Two times a day (BID) | ORAL | Status: DC
  Administered 2022-02-15 – 2022-02-18 (×6): 1 via ORAL
  Filled 2022-02-15 (×6): qty 1

## 2022-02-15 MED ORDER — THIAMINE HCL 100 MG/ML IJ SOLN
100.0000 mg | Freq: Every day | INTRAMUSCULAR | Status: DC
  Filled 2022-02-15: qty 2

## 2022-02-15 MED ORDER — LORAZEPAM 1 MG PO TABS: 1.0000 mg | ORAL_TABLET | ORAL | Status: DC | PRN

## 2022-02-15 MED ORDER — ADULT MULTIVITAMIN W/MINERALS CH
1.0000 | ORAL_TABLET | Freq: Every day | ORAL | Status: DC
  Administered 2022-02-15 – 2022-02-18 (×4): 1 via ORAL
  Filled 2022-02-15 (×4): qty 1

## 2022-02-15 MED ORDER — LORAZEPAM 2 MG/ML IJ SOLN: 1.0000 mg | INTRAMUSCULAR | Status: DC | PRN

## 2022-02-15 MED ORDER — SODIUM CHLORIDE 0.9 % IV SOLN: INTRAVENOUS | Status: DC

## 2022-02-15 MED ORDER — CARVEDILOL 12.5 MG PO TABS
12.5000 mg | ORAL_TABLET | Freq: Two times a day (BID) | ORAL | Status: DC
  Administered 2022-02-15 – 2022-02-18 (×6): 12.5 mg via ORAL
  Filled 2022-02-15 (×6): qty 1

## 2022-02-15 MED ORDER — TAMSULOSIN HCL 0.4 MG PO CAPS
0.4000 mg | ORAL_CAPSULE | Freq: Every day | ORAL | Status: DC
  Administered 2022-02-16 – 2022-02-18 (×3): 0.4 mg via ORAL
  Filled 2022-02-15 (×3): qty 1

## 2022-02-15 NOTE — H&P (Signed)
History and Physical    Patient: [name] [name]:[id number] DOB: 09-18-1970 DOA: 02/14/2022 DOS: the patient was seen and examined on 02/15/2022 PCP: Dorna Mai, MD  Patient coming from: Home  Chief Complaint:  Chief Complaint  Patient presents with   Abdominal Pain   HPI: [name] [name] is a 51 y.o. male with medical history significant of EtOH abuse, marijuana abuse, cirrhosis, BPH, chronic systolic HF. Presenting with abdominal pain, N/V. He reports that he had several episodes of "throwing up black stuff" over the last 4 days. During this time, he has had sharp RUQ/RLQ abdominal pain. He reports intermittent black stools. He denies CP, dyspnea, lightheadedness/dizziness. When his symptoms did not improve yesterday, he decided to go to the ED for evaluation.   Review of Systems: As mentioned in the history of present illness. All other systems reviewed and are negative. Past Medical History:  Diagnosis Date   COLONIC POLYPS, BENIGN 01/20/2010   Annotation: 12/2009: path benign; rpt 2016 Qualifier: Diagnosis of  By: Jorene Minors, [place]     Hypertension    Past Surgical History:  Procedure Laterality Date   POLYPECTOMY     RIGHT/LEFT HEART CATH AND CORONARY ANGIOGRAPHY N/A 08/27/2016   Procedure: Right/Left Heart Cath and Coronary Angiography;  Surgeon: Sherren Mocha, MD;  Location: [place] [facility];  Service: Cardiovascular;  Laterality: N/A;   Social History:  reports that he has never smoked. He has never used smokeless tobacco. He reports current alcohol use. He reports current drug use. Drug: Marijuana.  No Known Allergies  Family History  Problem Relation Age of Onset   Diabetes Other    Hypertension Other     Prior to Admission medications   Medication Sig Start Date End Date Taking? Authorizing Provider  carvedilol (COREG) 12.5 MG tablet Take 1 tablet (12.5 mg total) by mouth 2 (two) times daily. 08/21/21  Yes Dorna Mai, MD  sacubitril-valsartan (ENTRESTO)  97-103 MG Take 1 tablet by mouth 2 (two) times daily. 08/21/21  Yes Dorna Mai, MD  spironolactone (ALDACTONE) 25 MG tablet Take 0.5 tablets (12.5 mg total) by mouth daily. 08/21/21  Yes Dorna Mai, MD  tamsulosin (FLOMAX) 0.4 MG CAPS capsule Take 1 capsule (0.4 mg total) by mouth daily. 07/24/21  Yes Fenton Foy, NP    Physical Exam: Vitals:   02/15/22 1310 02/15/22 1330 02/15/22 1400 02/15/22 1457  BP: 128/78 131/76 (!) 124/95 (!) 137/90  Pulse: 75 77 77 89  Resp: '14 14 16 20  '$ Temp:    98.4 F (36.9 C)  TempSrc:      SpO2: 95% 98% 93% 97%  Weight:      Height:       General: 51 y.o. male resting in bed in NAD Eyes: PERRL, normal sclera ENMT: Nares patent w/o discharge, orophaynx clear, dentition normal, ears w/o discharge/lesions/ulcers Neck: Supple, trachea midline Cardiovascular: RRR, +S1, S2, no m/g/r, equal pulses throughout Respiratory: CTABL, no w/r/r, normal WOB GI: BS+, ND, soft, TTP RUQ/RLQ/LUQ, no masses noted, no organomegaly noted MSK: No e/c/c Neuro: A&O x 3, no focal deficits Psyc: Appropriate interaction and affect, calm/cooperative  Data Reviewed:  Lab Results  Component Value Date   NA 133 (L) 02/14/2022   K 3.0 (L) 02/14/2022   CO2 26 02/14/2022   GLUCOSE 129 (H) 02/14/2022   BUN 7 02/14/2022   CREATININE 0.78 02/14/2022   CALCIUM 10.0 02/14/2022   GFRNONAA >60 02/14/2022   Lab Results  Component Value Date   ALT  30 02/14/2022   AST 121 (H) 02/14/2022   ALKPHOS 75 02/14/2022   BILITOT 2.3 (H) 02/14/2022   Lab Results  Component Value Date   WBC 6.4 02/14/2022   HGB 14.9 02/14/2022   HCT 42.6 02/14/2022   MCV 103.4 (H) 02/14/2022   PLT 101 (L) 02/14/2022   CT ab/pelvis 1. Mild inflammatory changes surrounding the pancreas and duodenum suspicious for pancreatitis/duodenitis. No organized fluid collection 2. Hepatic steatosis with findings suggestive of cirrhosis 3. Lucent circumscribed lesions in the L3, L4 and L5  vertebral bodies suggestive of benign process such as hemangioma but not definitive. Consider correlation with nonemergent MRI.  Assessment and Plan: Pancreatitis Duodenitis Possible UGIB     - placed in obs, med-surg     - gentle fluids     - continue protonix     - NPO excepts sips/ice chips for now     - spoke with [facility], they will see him     - trend H&H     - pain control, anti-emetics; of note: he says he hasn't vomited since being in the ED  Hypokalemia Hyponatremia     - replace K+; check Mg2+     - gentle fluids, follow  HTN     - continue home regimen when confirmed  BPH     - continue home regimen when confirmed  Chronic systolic HF     - hold diuretic; as he is getting gentle fluids     - I&O, daily weights  Cirrhosis of liver Elevated LFTs     - RUQ ab [place]     - hepatitis panel     - monitor  EtOH abuse     - CIWA  Lumbar vertebral lesions     - as noted on CT     - can obtain MR after pancreatitis resolves  Advance Care Planning:   Code Status: FULL  Consults: [facility]  Family Communication: None at bedside  Severity of Illness: The appropriate patient status for this patient is OBSERVATION. Observation status is judged to be reasonable and necessary in order to provide the required intensity of service to ensure the patient's safety. The patient's presenting symptoms, physical exam findings, and initial radiographic and laboratory data in the context of their medical condition is felt to place them at decreased risk for further clinical deterioration. Furthermore, it is anticipated that the patient will be medically stable for discharge from the hospital within 2 midnights of admission.   Time spent in coordination of this H&P: 45 minutes  Author: Jonnie Finner, DO 02/15/2022 4:00 PM  For on call review [email or web address].

## 2022-02-15 NOTE — ED Notes (Signed)
Went in to check on Pt, Pt currently sleeping. Stretcher locked and bed rails locked.

## 2022-02-15 NOTE — ED Notes (Signed)
Report given to the floor. 

## 2022-02-15 NOTE — ED Notes (Signed)
Report given to [facility]. 

## 2022-02-16 ENCOUNTER — Encounter (HOSPITAL_COMMUNITY): Payer: Self-pay | Admitting: Internal Medicine

## 2022-02-16 DIAGNOSIS — N401 Enlarged prostate with lower urinary tract symptoms: Secondary | ICD-10-CM | POA: Diagnosis present

## 2022-02-16 DIAGNOSIS — K852 Alcohol induced acute pancreatitis without necrosis or infection: Secondary | ICD-10-CM | POA: Diagnosis present

## 2022-02-16 DIAGNOSIS — E871 Hypo-osmolality and hyponatremia: Secondary | ICD-10-CM | POA: Diagnosis present

## 2022-02-16 DIAGNOSIS — K2970 Gastritis, unspecified, without bleeding: Secondary | ICD-10-CM | POA: Diagnosis not present

## 2022-02-16 DIAGNOSIS — R351 Nocturia: Secondary | ICD-10-CM | POA: Diagnosis present

## 2022-02-16 DIAGNOSIS — K7030 Alcoholic cirrhosis of liver without ascites: Secondary | ICD-10-CM | POA: Diagnosis present

## 2022-02-16 DIAGNOSIS — I5022 Chronic systolic (congestive) heart failure: Secondary | ICD-10-CM | POA: Diagnosis present

## 2022-02-16 DIAGNOSIS — Z833 Family history of diabetes mellitus: Secondary | ICD-10-CM | POA: Diagnosis not present

## 2022-02-16 DIAGNOSIS — K2971 Gastritis, unspecified, with bleeding: Secondary | ICD-10-CM | POA: Diagnosis present

## 2022-02-16 DIAGNOSIS — F1010 Alcohol abuse, uncomplicated: Secondary | ICD-10-CM

## 2022-02-16 DIAGNOSIS — F32A Depression, unspecified: Secondary | ICD-10-CM | POA: Diagnosis present

## 2022-02-16 DIAGNOSIS — D696 Thrombocytopenia, unspecified: Secondary | ICD-10-CM

## 2022-02-16 DIAGNOSIS — Z8249 Family history of ischemic heart disease and other diseases of the circulatory system: Secondary | ICD-10-CM | POA: Diagnosis not present

## 2022-02-16 DIAGNOSIS — Z86010 Personal history of colonic polyps: Secondary | ICD-10-CM | POA: Diagnosis not present

## 2022-02-16 DIAGNOSIS — Z79899 Other long term (current) drug therapy: Secondary | ICD-10-CM | POA: Diagnosis not present

## 2022-02-16 DIAGNOSIS — K920 Hematemesis: Secondary | ICD-10-CM | POA: Diagnosis not present

## 2022-02-16 DIAGNOSIS — K859 Acute pancreatitis without necrosis or infection, unspecified: Secondary | ICD-10-CM | POA: Diagnosis present

## 2022-02-16 DIAGNOSIS — K449 Diaphragmatic hernia without obstruction or gangrene: Secondary | ICD-10-CM | POA: Diagnosis present

## 2022-02-16 DIAGNOSIS — R7989 Other specified abnormal findings of blood chemistry: Secondary | ICD-10-CM | POA: Diagnosis not present

## 2022-02-16 DIAGNOSIS — E876 Hypokalemia: Secondary | ICD-10-CM | POA: Diagnosis present

## 2022-02-16 DIAGNOSIS — K2980 Duodenitis without bleeding: Secondary | ICD-10-CM | POA: Diagnosis present

## 2022-02-16 DIAGNOSIS — K760 Fatty (change of) liver, not elsewhere classified: Secondary | ICD-10-CM | POA: Diagnosis present

## 2022-02-16 DIAGNOSIS — F10139 Alcohol abuse with withdrawal, unspecified: Secondary | ICD-10-CM | POA: Diagnosis present

## 2022-02-16 DIAGNOSIS — D492 Neoplasm of unspecified behavior of bone, soft tissue, and skin: Secondary | ICD-10-CM | POA: Diagnosis present

## 2022-02-16 DIAGNOSIS — I110 Hypertensive heart disease with heart failure: Secondary | ICD-10-CM | POA: Diagnosis present

## 2022-02-16 DIAGNOSIS — E860 Dehydration: Secondary | ICD-10-CM | POA: Diagnosis present

## 2022-02-16 LAB — CBC
HCT: 35.4 % — ABNORMAL LOW (ref 39.0–52.0)
Hemoglobin: 12.1 g/dL — ABNORMAL LOW (ref 13.0–17.0)
MCH: 36.1 pg — ABNORMAL HIGH (ref 26.0–34.0)
MCHC: 34.2 g/dL (ref 30.0–36.0)
MCV: 105.7 fL — ABNORMAL HIGH (ref 80.0–100.0)
Platelets: 87 10*3/uL — ABNORMAL LOW (ref 150–400)
RBC: 3.35 MIL/uL — ABNORMAL LOW (ref 4.22–5.81)
RDW: 12.2 % (ref 11.5–15.5)
WBC: 4.3 10*3/uL (ref 4.0–10.5)
nRBC: 0 % (ref 0.0–0.2)

## 2022-02-16 LAB — BILIRUBIN, FRACTIONATED(TOT/DIR/INDIR)
Bilirubin, Direct: 0.7 mg/dL — ABNORMAL HIGH (ref 0.0–0.2)
Indirect Bilirubin: 1 mg/dL — ABNORMAL HIGH (ref 0.3–0.9)
Total Bilirubin: 1.7 mg/dL — ABNORMAL HIGH (ref 0.3–1.2)

## 2022-02-16 LAB — COMPREHENSIVE METABOLIC PANEL
ALT: 30 U/L (ref 0–44)
AST: 117 U/L — ABNORMAL HIGH (ref 15–41)
Albumin: 2.8 g/dL — ABNORMAL LOW (ref 3.5–5.0)
Alkaline Phosphatase: 58 U/L (ref 38–126)
Anion gap: 8 (ref 5–15)
BUN: <5 mg/dL — ABNORMAL LOW (ref 6–20)
CO2: 30 mmol/L (ref 22–32)
Calcium: 8.3 mg/dL — ABNORMAL LOW (ref 8.9–10.3)
Chloride: 95 mmol/L — ABNORMAL LOW (ref 98–111)
Creatinine, Ser: 0.67 mg/dL (ref 0.61–1.24)
GFR, Estimated: >60 mL/min (ref >60)
Glucose, Bld: 127 mg/dL — ABNORMAL HIGH (ref 70–99)
Potassium: 2.9 mmol/L — ABNORMAL LOW (ref 3.5–5.1)
Sodium: 133 mmol/L — ABNORMAL LOW (ref 135–145)
Total Bilirubin: 2 mg/dL — ABNORMAL HIGH (ref 0.3–1.2)
Total Protein: 6.6 g/dL (ref 6.5–8.1)

## 2022-02-16 LAB — POTASSIUM: Potassium: 3.9 mmol/L (ref 3.5–5.1)

## 2022-02-16 LAB — MAGNESIUM: Magnesium: 1.2 mg/dL — ABNORMAL LOW (ref 1.7–2.4)

## 2022-02-16 LAB — SEDIMENTATION RATE: Sed Rate: 17 mm/hr — ABNORMAL HIGH (ref 0–16)

## 2022-02-16 LAB — TRIGLYCERIDES: Triglycerides: 53 mg/dL (ref <150)

## 2022-02-16 LAB — C-REACTIVE PROTEIN: CRP: 1 mg/dL — ABNORMAL HIGH (ref <1.0)

## 2022-02-16 MED ORDER — OCTREOTIDE LOAD VIA INFUSION
50.0000 ug | Freq: Once | INTRAVENOUS | Status: AC
  Administered 2022-02-16: 50 ug via INTRAVENOUS
  Filled 2022-02-16: qty 25

## 2022-02-16 MED ORDER — OXYCODONE HCL 5 MG PO TABS
5.0000 mg | ORAL_TABLET | Freq: Four times a day (QID) | ORAL | Status: DC | PRN
  Administered 2022-02-16 – 2022-02-17 (×2): 5 mg via ORAL
  Filled 2022-02-16 (×2): qty 1

## 2022-02-16 MED ORDER — POTASSIUM CHLORIDE CRYS ER 20 MEQ PO TBCR
20.0000 meq | EXTENDED_RELEASE_TABLET | Freq: Once | ORAL | Status: AC
  Administered 2022-02-16: 20 meq via ORAL
  Filled 2022-02-16: qty 1

## 2022-02-16 MED ORDER — SODIUM CHLORIDE 0.9 % IV SOLN: INTRAVENOUS | Status: AC

## 2022-02-16 MED ORDER — SODIUM CHLORIDE 0.9 % IV SOLN
1.0000 g | INTRAVENOUS | Status: DC
  Administered 2022-02-16: 1 g via INTRAVENOUS
  Filled 2022-02-16: qty 10

## 2022-02-16 MED ORDER — POTASSIUM CHLORIDE CRYS ER 20 MEQ PO TBCR
30.0000 meq | EXTENDED_RELEASE_TABLET | ORAL | Status: AC
  Administered 2022-02-16 (×3): 30 meq via ORAL
  Filled 2022-02-16 (×3): qty 1

## 2022-02-16 MED ORDER — MAGNESIUM SULFATE 4 GM/100ML IV SOLN
4.0000 g | Freq: Once | INTRAVENOUS | Status: AC
  Administered 2022-02-16: 4 g via INTRAVENOUS
  Filled 2022-02-16: qty 100

## 2022-02-16 MED ORDER — SODIUM CHLORIDE 0.9 % IV SOLN
50.0000 ug/h | INTRAVENOUS | Status: DC
  Administered 2022-02-16: 50 ug/h via INTRAVENOUS
  Filled 2022-02-16 (×2): qty 1

## 2022-02-16 NOTE — TOC Initial Note (Signed)
Transition of Care [facility]) - Initial/Assessment Note    Patient Details  Name: [name] [name] MRN: [id number] Date of Birth: September 04, 1970  Transition of Care [place] [facility]) CM/SW Contact:    Leeroy Cha, RN Phone Number: 02/16/2022, 8:10 AM  Clinical Narrative:                 Substance abuse resources added to the dc instructions for pt use at dc  Expected Discharge Plan: Home/Self Care Barriers to Discharge: Continued Medical Work up   Patient Goals and CMS Choice Patient states their goals for this hospitalization and ongoing recovery are:: to go home CMS Medicare.gov Compare Post Acute Care list provided to:: Patient Choice offered to / list presented to : Patient  Expected Discharge Plan and Services Expected Discharge Plan: Home/Self Care   Discharge Planning Services: CM Consult   Living arrangements for the past 2 months: Single Family Home                                      Prior Living Arrangements/Services Living arrangements for the past 2 months: Single Family Home Lives with:: Self Patient language and need for interpreter reviewed:: Yes Do you feel safe going back to the place where you live?: Yes            Criminal Activity/Legal Involvement Pertinent to Current Situation/Hospitalization: No - Comment as needed  Activities of Daily Living Home Assistive Devices/Equipment: None ADL Screening (condition at time of admission) Patient's cognitive ability adequate to safely complete daily activities?: Yes Is the patient deaf or have difficulty hearing?: No Does the patient have difficulty seeing, even when wearing glasses/contacts?: No Does the patient have difficulty concentrating, remembering, or making decisions?: No Patient able to express need for assistance with ADLs?: No Does the patient have difficulty dressing or bathing?: No Independently performs ADLs?: Yes (appropriate for developmental age) Does the patient have difficulty walking or  climbing stairs?: No Weakness of Legs: None Weakness of Arms/Hands: None  Permission Sought/Granted                  Emotional Assessment Appearance:: Appears stated age Attitude/Demeanor/Rapport: Engaged Affect (typically observed): Calm Orientation: : Oriented to Situation, Oriented to  Time, Oriented to Place, Oriented to Self Alcohol / Substance Use: Alcohol Use, Illicit Drugs (etoh intake and marijuana) Psych Involvement: No (comment)  Admission diagnosis:  Hyperbilirubinemia [E80.6] PUD (peptic ulcer disease) [K27.9] Severe dehydration [E86.0] Duodenitis [K29.80] Narrow anion gap [E87.8] Patient Active Problem List   Diagnosis Date Noted   Cirrhosis of liver ([place]) 02/15/2022   Elevated LFTs 02/15/2022   Hyponatremia 02/15/2022   Lesion of lumbar spine 02/15/2022   Duodenitis 02/14/2022   Encounter for orthopedic follow-up care 07/25/2020   Ganglion cyst 06/22/2020   Benign prostatic hyperplasia with nocturia 44/31/5400   Chronic systolic CHF (congestive heart failure) ([place]) 01/23/2017   Non-ischemic cardiomyopathy ([place]) 09/19/2016   HLD (hyperlipidemia) 09/19/2016   ETOH abuse 09/19/2016   Moderate mitral regurgitation 09/05/2016   Cocaine abuse ([place]) 08/23/2016   Positive D dimer 08/23/2016   Hypokalemia 08/23/2016   Macrocytosis 08/23/2016   Alcohol use 08/20/2016   Major depressive disorder, recurrent severe without psychotic features ([place]) 08/13/2014   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 [place] [address]  Phone: [phone] Fax: [place] [address] Phone: [phone] Fax: [phone]     Social Determinants of Health (SDOH) Interventions    Readmission Risk Interventions   No data to display

## 2022-02-16 NOTE — Progress Notes (Signed)
   02/16/22 0302  Assess: MEWS Score  Temp 98.9 F (37.2 C)  BP (!) 96/54  MAP (mmHg) (!) 63  Pulse Rate 87  Resp 12  SpO2 96 %  Assess: MEWS Score  MEWS Temp 0  MEWS Systolic 1  MEWS Pulse 0  MEWS RR 1  MEWS LOC 0  MEWS Score 2  MEWS Score Color Yellow  Assess: if the MEWS score is Yellow or Red  Were vital signs taken at a resting state? Yes  Focused Assessment No change from prior assessment  Does the patient meet 2 or more of the SIRS criteria? No  Does the patient have a confirmed or suspected source of infection? No  MEWS guidelines implemented *See Row Information* Yes  Treat  MEWS Interventions Escalated (See documentation below)  Take Vital Signs  Increase Vital Sign Frequency  Yellow: Q 2hr X 2 then Q 4hr X 2, if remains yellow, continue Q 4hrs  Escalate  MEWS: Escalate Yellow: discuss with charge nurse/RN and consider discussing with provider and RRT  Notify: Charge Nurse/RN  Name of Charge Nurse/RN Notified Kerry Dory, RN  Date Charge Nurse/RN Notified 02/16/22  Time Charge Nurse/RN Notified 0355  Notify: Provider  Provider Name/Title Raenette Rover, NP  Date Provider Notified 02/16/22  Time Provider Notified [phone]  Method of Notification Page  Notification Reason Other (Comment) (Yellow MEWS)  Provider response No new orders  Date of Provider Response 02/16/22  Time of Provider Response [id number]  Document  Patient Outcome Other (Comment) (Asymptomatic)  Assess: SIRS CRITERIA  SIRS Temperature  0  SIRS Pulse 0  SIRS Respirations  0  SIRS WBC 1  SIRS Score Sum  1

## 2022-02-16 NOTE — Progress Notes (Addendum)
Daily Progress Note  Hospital Day: 3  Chief Complaint: hematemesis  Brief History [name] [name] is a 51 y.o. male with past medical history significant for EtOH/marijuana abuse, hepatic steatosis / ? Cirrhosis on imaging,  BPH, chronic systolic congestive heart failure . Seen in consultation by [place] on 9/28 for hematemesis  Assessment / Plan   # 51 yo male admitted with pancreatitis, likely Etoh related . RUQ [place] negative for cholelithiasis or biliary duct dilation. Triglycerides normal.  Ionized Ca+ pending.  No culprit medications Hemodynamically stable. HCT 35%. Renal function normal.  Labs reviewed: K+ 2.9  Will need MRI / MRCP in a few weeks to ensure improvement in pancreatitis / duodenitis as well as rule out pancreatic disease.  Stop octreotide  # Nausea / vomiting / hematemesis. N/V possibly secondary to pancreatitis. Hematemesis may be result of esophagitis, PUD, duodenitis.  Hgb is down from baseline of 14.6 to 12.1. GI bleeding possibly contributing but also may be dilutional to some degree.  Continue BID PPI Will see how he does with trial of clear liquids No N/V or hematemesis today. Advance to full liquids.  He will need an EGD ( likely on Sunday). Will give more time to recover and also correct electrolyte abnormalities.  # Some concern for cirrhosis on imaging / thrombocytopenia / mildly elevated total bilirubin If has cirrhosis then doesn't appear to be complicated by portal HTN by imaging.  Platelets 87 (normal in 2021). No splenomegaly on imaging. Suspect bone marrow suppression is more likely cause of low platelets.  Mild elevation in Tbili could be Gilbert's. No clear cut evidence for cirrhosis at this point. No biliary duct dilation on [place]. Normal alk phos.  Will fractionate bilirubin  D/C octreotide D/C Rocephin, no ascites on imaging  # Hypokalemia, likely related to vomiting. Mg+ also low. Repletion in progress   Subjective   No n/v today.    Objective   Imaging:  US Abdomen Limited RUQ (LIVER/GB)  Result Date: 02/15/2022 CLINICAL DATA:  Elevated LFT EXAM: ULTRASOUND ABDOMEN LIMITED RIGHT UPPER QUADRANT COMPARISON:  CT 02/14/2022 FINDINGS: Gallbladder: No gallstones or wall thickening visualized. No sonographic Murphy sign noted by sonographer. Common bile duct: Diameter: 4.4 mm Liver: Echogenic liver parenchyma. No focal hepatic abnormality. Portal vein is patent on color Doppler imaging with normal direction of blood flow towards the liver. Other: None. IMPRESSION: 1. Coarse echogenic liver parenchyma consistent with hepatic steatosis and or hepatocellular disease. 2. Otherwise negative right upper quadrant abdominal ultrasound Electronically Signed   By: [name] [name] M.D.   On: 02/15/2022 19:26   CT ABDOMEN PELVIS W CONTRAST  Result Date: 02/14/2022 CLINICAL DATA:  Nausea vomiting elevated lipase EXAM: CT ABDOMEN AND PELVIS WITH CONTRAST TECHNIQUE: Multidetector CT imaging of the abdomen and pelvis was performed using the standard protocol following bolus administration of intravenous contrast. RADIATION DOSE REDUCTION: This exam was performed according to the departmental dose-optimization program which includes automated exposure control, adjustment of the mA and/or kV according to patient size and/or use of iterative reconstruction technique. CONTRAST:  52m OMNIPAQUE IOHEXOL 300 MG/ML  SOLN COMPARISON:  CT 08/20/2016 FINDINGS: Lower chest: Lung bases demonstrate no acute airspace disease. Hepatobiliary: Hepatic steatosis. Subtle surface nodularity suspicious for cirrhosis. No calcified gallstone or biliary dilatation Pancreas: Inflammatory changes at the pancreaticoduodenal groove with trace fluid and inflammation in the right anterior pararenal space. Spleen: Normal in size without focal abnormality. Adrenals/Urinary Tract: Adrenal glands are normal. No hydronephrosis. Chronic peripherally calcified benign-appearing  lesion superior  pole left kidney, no imaging follow-up is recommended. The bladder is unremarkable Stomach/Bowel: The stomach is nonenlarged. No dilated small bowel. Negative appendix. Vascular/Lymphatic: Moderate aortic atherosclerosis. No aneurysm. No suspicious lymph nodes Reproductive: Prostate is unremarkable. Other: Negative for pelvic effusion or free air. Musculoskeletal: No acute osseous abnormality. Lucent circumscribed lesions within the L3, L4 and L5 vertebral bodies. IMPRESSION: 1. Mild inflammatory changes surrounding the pancreas and duodenum suspicious for pancreatitis/duodenitis. No organized fluid collection 2. Hepatic steatosis with findings suggestive of cirrhosis 3. Lucent circumscribed lesions in the L3, L4 and L5 vertebral bodies suggestive of benign process such as hemangioma but not definitive. Consider correlation with nonemergent MRI. Electronically Signed   By: [name] [name] M.D.   On: 02/14/2022 20:00    Lab Results: Recent Labs    02/14/22 1540 02/15/22 1651 02/16/22 0051  WBC 6.4 4.7 4.3  HGB 14.9 13.3 12.1*  HCT 42.6 39.0 35.4*  PLT 101* 92* 87*   BMET Recent Labs    02/14/22 1540 02/15/22 1651 02/16/22 0051  NA 133* 134* 133*  K 3.0* 3.4* 2.9*  CL 87* 92* 95*  CO2 26 32 30  GLUCOSE 129* 91 127*  BUN 7 5* <5*  CREATININE 0.78 0.71 0.67  CALCIUM 10.0 8.4* 8.3*   LFT Recent Labs    02/16/22 0051  PROT 6.6  ALBUMIN 2.8*  AST 117*  ALT 30  ALKPHOS 58  BILITOT 2.0*   PT/INR No results for input(s): "LABPROT", "INR" in the last 72 hours.   Scheduled inpatient medications:   carvedilol  12.5 mg Oral BID   folic acid  1 mg Oral Daily   multivitamin with minerals  1 tablet Oral Daily   pantoprazole (PROTONIX) IV  40 mg Intravenous Q12H   potassium chloride  30 mEq Oral Q3H   sacubitril-valsartan  1 tablet Oral BID   tamsulosin  0.4 mg Oral Daily   thiamine  100 mg Oral Daily   Or   thiamine  100 mg Intravenous Daily   Continuous inpatient infusions:    sodium chloride 75 mL/hr at 02/15/22 1823   sodium chloride 75 mL/hr at 02/16/22 1346   cefTRIAXone (ROCEPHIN)  IV 1 g (02/16/22 1032)   octreotide (SANDOSTATIN) 500 mcg in sodium chloride 0.9 % 250 mL (2 mcg/mL) infusion 50 mcg/hr (02/16/22 0606)   pantoprazole (PROTONIX) IV     PRN inpatient medications: fentaNYL (SUBLIMAZE) injection, LORazepam **OR** LORazepam, ondansetron **OR** ondansetron (ZOFRAN) IV, oxyCODONE  Vital signs in last 24 hours: Temp:  [98.4 F (36.9 C)-98.9 F (37.2 C)] 98.4 F (36.9 C) (09/29 1136) Pulse Rate:  [84-104] 92 (09/29 1136) Resp:  [12-20] 17 (09/29 1136) BP: (96-137)/(54-90) 123/88 (09/29 1136) SpO2:  [95 %-99 %] 98 % (09/29 1136) Last BM Date : 02/14/22  Intake/Output Summary (Last 24 hours) at 02/16/2022 1444 Last data filed at 02/16/2022 1100 Gross per 24 hour  Intake 1227.88 ml  Output 650 ml  Net 577.88 ml    Intake/Output from previous day: 09/28 0701 - 09/29 0700 In: 27.9 [I.V.:27.9] Out: -  Intake/Output this shift: Total I/O In: 1200 [P.O.:1200] Out: 650 [Urine:650]   Physical Exam:  General: Alert male in NAD Heart:  Regular rate and rhythm. No lower extremity edema Pulmonary: Normal respiratory effort Abdomen: Soft, nondistended, nontender. Normal bowel sounds.  Neurologic: Alert and oriented Psych: Pleasant. Cooperative.    Principal Problem:   Duodenitis Active Problems:   Essential hypertension   Hypokalemia   ETOH abuse  Chronic systolic CHF (congestive heart failure) ([facility])   Benign prostatic hyperplasia with nocturia   Cirrhosis of liver ([facility])   Elevated LFTs   Hyponatremia   Lesion of lumbar spine   Acute pancreatitis     LOS: 0 days   Tye Savoy ,NP 02/16/2022, 2:44 PM  I have taken an interval history, thoroughly reviewed the chart and examined the patient. I agree with the Advanced Practitioner's note, impression and recommendations, and have recorded additional findings, impressions and  recommendations below. I performed a substantive portion of this encounter (>50% time spent), including a complete performance of the medical decision making.  My additional thoughts are as follows:   Signout received from Dr. Rush Landmark, and I saw the patient at the same time as our NP who recorded the note above.  Small-volume hematemesis, no more since admission, no melena.  Hemoglobin remains normal. Probable cirrhosis based on imaging and thrombocytopenia, though no CT evidence of portal hypertension, so I do not think this is likely to be variceal bleeding.  We are therefore stop the octreotide.  There is also no ascites, so antibiotics were stopped.  He still has epigastric pain from the pancreatitis and we will keep him on a liquid diet.  Upper endoscopy that was tentatively planned for tomorrow has been moved to the following day to allow more repletion of magnesium and potassium prior to undergoing anesthesia.  (Plan conveyed to hospitalist)  Nelida Meuse III Office:[phone]

## 2022-02-16 NOTE — Progress Notes (Signed)
Mobility Specialist Cancellation/Refusal Note:    02/16/22 1009  Mobility  Activity Refused mobility    Reason for Cancellation/Refusal: Pt declined mobility at this time. Pt sleeping at this time. Will check back as schedule permits.      [facility]

## 2022-02-16 NOTE — Progress Notes (Signed)
PROGRESS NOTE    [name] [name]  WJX:[id number] DOB: 31-May-1970 DOA: 02/14/2022 PCP: Dorna Mai, MD    Brief Narrative:   [name] [name] is a 51 y.o. male with past medical history significant for EtOH/marijuana abuse, EtOH cirrhosis, BPH, chronic systolic congestive heart failure who presented to [place] ED on 9/27 with complaints of abdominal pain and coffee-ground emesis over the last 4 days.  Reports sharp epigastric/right upper quadrant abdominal pain with intermittent black stools.  He denies chest pain, no shortness of breath, no lightheadedness/dizziness.  Given persistence of symptoms he proceeded to urgent care/ED for further evaluation.  In the ED, temperature 97.8 F, HR 112, RR 18, BP 164/100, SPO2 94% on room air.  WBC 6.4, hemoglobin 14.9, platelets 101.  Sodium 133, potassium 3.0, chloride 87, CO2 26, glucose 129, BUN 7, creatinine 0.78.  Lipase 278.  AST 121, ALT 30, total bilirubin 2.3.  Urinalysis with greater than 80 ketones, trace hemoglobin, 30 protein, otherwise unrevealing.  CT abdomen/pelvis with mild inflammatory changes around the pancreas and duodenum suspicious for pancreatitis/duodenitis with no organized fluid collection, hepatic steatosis with findings suggestive of cirrhosis and lucent circumscribed lesions L3, L4 and L5 vertebral body suggestive of benign process such as hemangioma.  Gastroenterology was consulted.  TRH consulted for admission and patient was transferred to [facility] for further evaluation of hematemesis, pancreatitis/duodenitis.  Assessment & Plan:   Pancreatitis/duodenitis Patient presenting with epigastric/right upper quadrant abdominal pain associated with nausea/vomiting.  Continues with underlying alcohol abuse likely precipitating factor.  Lipase elevated on admission to 278.  CT abdomen/pelvis with mild inflammatory changes around the pancreas/duodenum consistent with pancreatitis/duodenitis.  CRP level  1.0. --Clear liquid diet --NS at 73m/h --Ceftriaxone 1 g IV every 24 hours --Pain control with fentanyl 1012m q4h PRN severe pain --Supportive care, antiemetics  Hematemesis Patient presenting to ED with reported several episodes of hematemesis with coffee-ground emesis.  This is in the setting of continued alcohol abuse.  Not on antiplatelet/anticoagulant outpatient. --[facility] following, appreciate assistance --Hgb 14.9>13.3>12.1; stable --CBC daily --Clear liquid diet --Protonix '40mg'$  IV q12h --octreotide drip --EGD planned 9/30, NPO after MN  Hypokalemia Potassium 2.9 this morning, will continue repletion. --Continue to monitor electrolytes daily to include magnesium  Hyponatremia Etiology likely secondary to beer potomania in the setting of continued alcohol abuse.  Sodium 133, stable. --BMP daily  Essential hypertension Hx Chronic systolic congestive heart failure, compensated BP 122/81 this morning.  Home regimen includes carvedilol 12.5 mg p.o. twice daily, Entresto 97-103 mg p.o. twice daily and Aldactone 12.5 mg p.o. daily.  TTE 07/30/2019 with LVEF 70-75%, LVEF was 15% in 2018. --Continue carvedilol 12.5 mg PO BID --Continue Entresto 97-103 mg PO BID --Holding home Aldactone --Strict I's and O's and daily weights  EtOH cirrhosis CT abdomen/pelvis with findings suggestive of cirrhosis.  Right upper quadrant ultrasound with coarse echogenic liver parenchyma consistent with hepatic steatosis versus hepatocellular disease, otherwise unrevealing.  Discussed need for complete alcohol cessation.  EtOH use disorder AST/ALT ratio consistent with continued alcohol abuse.  Counseled on need for complete cessation. --CIWA protocol with symptom triggered Ativan --Thiamine, folate, multivitamin --TOC for substance abuse resources  BPH: Continue tamsulosin 0.4 mg p.o. daily  Incidental finding of lumbar vertebral lesions Incidental finding of lucent circumscribed lesions  L3, L4, and L5 vertebral body suggestive of benign process such as an hemangioma.  Recommend outpatient follow-up with dedicated MRI.   DVT prophylaxis: SCDs Start: 02/15/22 1719    Code Status:  Full Code Family Communication: No family present at bedside this morning  Disposition Plan:  Level of care: Med-Surg Status is: Inpatient Remains inpatient appropriate because: Pending EGD tomorrow, will need toleration of advance diet before stable for discharge home    Consultants:  [facility]  Procedures:  None  Antimicrobials:  Ceftriaxone 9/29>>   Subjective: Patient seen and examined at bedside, resting comfortably lying in bed.  Continues with abdominal discomfort but much improved.  No further nausea or vomiting.  Tolerating clear liquid diet.  Discussed pending EGD tomorrow.  Also discussed need for complete alcohol cessation.  No other specific questions or concerns at this time.  Denies headache, no dizziness, no chest pain, no palpitations, no shortness of breath, no cough/congestion, no fever/chills/night sweats, no current nausea/vomiting, no diarrhea, no focal weakness, no fatigue, no paresthesias.  No acute events overnight per nursing staff.  Objective: Vitals:   02/16/22 0302 02/16/22 0456 02/16/22 0656 02/16/22 1136  BP: (!) 96/54 122/81 115/81 123/88  Pulse: 87 84 89 92  Resp: '12 14 14 17  '$ Temp: 98.9 F (37.2 C) 98.4 F (36.9 C) 98.7 F (37.1 C) 98.4 F (36.9 C)  TempSrc: Oral Oral Oral   SpO2: 96% 97% 97% 98%  Weight:      Height:        Intake/Output Summary (Last 24 hours) at 02/16/2022 1146 Last data filed at 02/15/2022 1846 Gross per 24 hour  Intake 27.88 ml  Output --  Net 27.88 ml   Filed Weights   02/14/22 1534  Weight: 95.3 kg    Examination:  Physical Exam: GEN: NAD, alert and oriented x 3, chronically ill in appearance, appears older than stated age HEENT: NCAT, PERRL, EOMI, sclera clear, MMM PULM: CTAB w/o wheezes/crackles, normal  respiratory effort, on room air CV: RRR w/o M/G/R GI: abd soft, mild epigastric/right upper quadrant tenderness, NABS, no R/G/M MSK: no peripheral edema, muscle strength globally intact 5/5 bilateral upper/lower extremities NEURO: CN II-XII intact, no focal deficits, sensation to light touch intact PSYCH: normal mood/affect Integumentary: dry/intact, no rashes or wounds    Data Reviewed: I have personally reviewed following labs and imaging studies  CBC: Recent Labs  Lab 02/14/22 1540 02/15/22 1651 02/16/22 0051  WBC 6.4 4.7 4.3  NEUTROABS  --  2.6  --   HGB 14.9 13.3 12.1*  HCT 42.6 39.0 35.4*  MCV 103.4* 106.6* 105.7*  PLT 101* 92* 87*   Basic Metabolic Panel: Recent Labs  Lab 02/14/22 1540 02/15/22 1651 02/16/22 0051  NA 133* 134* 133*  K 3.0* 3.4* 2.9*  CL 87* 92* 95*  CO2 26 32 30  GLUCOSE 129* 91 127*  BUN 7 5* <5*  CREATININE 0.78 0.71 0.67  CALCIUM 10.0 8.4* 8.3*  MG  --  1.4*  --    GFR: Estimated Creatinine Clearance: 127.6 mL/min (by C-G formula based on SCr of 0.67 mg/dL). Liver Function Tests: Recent Labs  Lab 02/14/22 1540 02/15/22 1651 02/16/22 0051  AST 121* 114* 117*  ALT '30 31 30  '$ ALKPHOS 75 63 58  BILITOT 2.3* 2.4* 2.0*  PROT 9.3* 7.3 6.6  ALBUMIN 4.3 3.1* 2.8*   Recent Labs  Lab 02/14/22 1540  LIPASE 278*   No results for input(s): "AMMONIA" in the last 168 hours. Coagulation Profile: No results for input(s): "INR", "PROTIME" in the last 168 hours. Cardiac Enzymes: No results for input(s): "CKTOTAL", "CKMB", "CKMBINDEX", "TROPONINI" in the last 168 hours. BNP (last 3 results) No results for  input(s): "PROBNP" in the last 8760 hours. HbA1C: No results for input(s): "HGBA1C" in the last 72 hours. CBG: No results for input(s): "GLUCAP" in the last 168 hours. Lipid Profile: Recent Labs    02/16/22 0051  TRIG 53   Thyroid Function Tests: No results for input(s): "TSH", "T4TOTAL", "FREET4", "T3FREE", "THYROIDAB" in the last  72 hours. Anemia Panel: No results for input(s): "VITAMINB12", "FOLATE", "FERRITIN", "TIBC", "IRON", "RETICCTPCT" in the last 72 hours. Sepsis Labs: No results for input(s): "PROCALCITON", "LATICACIDVEN" in the last 168 hours.  No results found for this or any previous visit (from the past 240 hour(s)).       Radiology Studies: US Abdomen Limited RUQ (LIVER/GB)  Result Date: 02/15/2022 CLINICAL DATA:  Elevated LFT EXAM: ULTRASOUND ABDOMEN LIMITED RIGHT UPPER QUADRANT COMPARISON:  CT 02/14/2022 FINDINGS: Gallbladder: No gallstones or wall thickening visualized. No sonographic Murphy sign noted by sonographer. Common bile duct: Diameter: 4.4 mm Liver: Echogenic liver parenchyma. No focal hepatic abnormality. Portal vein is patent on color Doppler imaging with normal direction of blood flow towards the liver. Other: None. IMPRESSION: 1. Coarse echogenic liver parenchyma consistent with hepatic steatosis and or hepatocellular disease. 2. Otherwise negative right upper quadrant abdominal ultrasound Electronically Signed   By: Donavan Foil M.D.   On: 02/15/2022 19:26   CT ABDOMEN PELVIS W CONTRAST  Result Date: 02/14/2022 CLINICAL DATA:  Nausea vomiting elevated lipase EXAM: CT ABDOMEN AND PELVIS WITH CONTRAST TECHNIQUE: Multidetector CT imaging of the abdomen and pelvis was performed using the standard protocol following bolus administration of intravenous contrast. RADIATION DOSE REDUCTION: This exam was performed according to the departmental dose-optimization program which includes automated exposure control, adjustment of the mA and/or kV according to patient size and/or use of iterative reconstruction technique. CONTRAST:  95m OMNIPAQUE IOHEXOL 300 MG/ML  SOLN COMPARISON:  CT 08/20/2016 FINDINGS: Lower chest: Lung bases demonstrate no acute airspace disease. Hepatobiliary: Hepatic steatosis. Subtle surface nodularity suspicious for cirrhosis. No calcified gallstone or biliary dilatation  Pancreas: Inflammatory changes at the pancreaticoduodenal groove with trace fluid and inflammation in the right anterior pararenal space. Spleen: Normal in size without focal abnormality. Adrenals/Urinary Tract: Adrenal glands are normal. No hydronephrosis. Chronic peripherally calcified benign-appearing lesion superior pole left kidney, no imaging follow-up is recommended. The bladder is unremarkable Stomach/Bowel: The stomach is nonenlarged. No dilated small bowel. Negative appendix. Vascular/Lymphatic: Moderate aortic atherosclerosis. No aneurysm. No suspicious lymph nodes Reproductive: Prostate is unremarkable. Other: Negative for pelvic effusion or free air. Musculoskeletal: No acute osseous abnormality. Lucent circumscribed lesions within the L3, L4 and L5 vertebral bodies. IMPRESSION: 1. Mild inflammatory changes surrounding the pancreas and duodenum suspicious for pancreatitis/duodenitis. No organized fluid collection 2. Hepatic steatosis with findings suggestive of cirrhosis 3. Lucent circumscribed lesions in the L3, L4 and L5 vertebral bodies suggestive of benign process such as hemangioma but not definitive. Consider correlation with nonemergent MRI. Electronically Signed   By: KDonavan FoilM.D.   On: 02/14/2022 20:00        Scheduled Meds:  carvedilol  12.5 mg Oral BID   folic acid  1 mg Oral Daily   multivitamin with minerals  1 tablet Oral Daily   pantoprazole (PROTONIX) IV  40 mg Intravenous Q12H   potassium chloride  30 mEq Oral Q3H   sacubitril-valsartan  1 tablet Oral BID   tamsulosin  0.4 mg Oral Daily   thiamine  100 mg Oral Daily   Or   thiamine  100 mg Intravenous Daily   Continuous  Infusions:  sodium chloride 75 mL/hr at 02/15/22 1823   cefTRIAXone (ROCEPHIN)  IV 1 g (02/16/22 1032)   octreotide (SANDOSTATIN) 500 mcg in sodium chloride 0.9 % 250 mL (2 mcg/mL) infusion 50 mcg/hr (02/16/22 0606)   pantoprazole (PROTONIX) IV       LOS: 0 days    Time spent: 50 minutes  spent on chart review, discussion with nursing staff, consultants, updating family and interview/physical exam; more than 50% of that time was spent in counseling and/or coordination of care.    Audie Wieser J [place] DO [facility] Available via Epic secure chat 7am-7pm After these hours, please refer to coverage provider listed on [email or web address] 02/16/2022, 11:46 AM

## 2022-02-16 NOTE — Progress Notes (Signed)
Mobility Specialist - Progress Note   02/16/22 1512  Mobility  HOB Elevated/Bed Position Self regulated  Activity Ambulated independently in hallway  Range of Motion/Exercises Active  Level of Assistance Independent  Assistive Device None  Distance Ambulated (ft) 500 ft  Activity Response Tolerated well  Transport method Ambulatory  $Mobility charge 1 Mobility   Pt received in bathroom and agreeable to mobility. No complaints during ambulation. Pt to bed after session with all needs met.    [facility]

## 2022-02-16 NOTE — Consult Note (Addendum)
Gastroenterology Inpatient Consultation   Attending Requesting [place], [place] Hospital Day: 3  Reason for Consult Pancreatitis and reported coffee-ground emesis    History of Present Illness  [name] [name] is a 51 y.o. male with a pmh significant for CHF, hypertension, prior substance abuse, alcohol use disorder, MDD.  The GI service is consulted for evaluation and management of pancreatitis and reported coffee-ground emesis.  The patient states that over the course of the last week or so he has been feeling unwell.  He began to have significant abdominal discomfort in the mid abdomen that generalized throughout the upper abdomen.  This was associated with progressive nausea and vomiting.  He describes episodes of vomiting dark material but no overt blood.  His stools have been darker in quality as well.  Because of progressive symptoms he came in for further evaluation.  He was found to have a progressive hyponatremia and also found to have a progressive thrombocytopenia on admission.  Cross-sectional imaging was obtained as well as right upper quadrant ultrasound imaging as outlined below that was suggestive of pancreatitis as well as suggestive of chronic liver disease with possible process.  He was admitted for further evaluation.  He describes a long history of alcohol consumption of 3-4 alcoholic beverages on a daily basis.  Sometimes he can drink for days at a time and there will be other days where he may not drink for a few days.  He denies significant alcohol withdrawals.  He has not initiated any new medications.  There is no family history of pancreatic or liver disease.  Although his liver tests were elevated 2021 he does not describe a prior history of being told he has liver disease.  He does not take significant nonsteroidals or BC/Goody powders.  Never had an upper endoscopy.  Over 10 years ago he had a colonoscopy but with benign findings.  At the time of  evaluation this evening he is doing and feeling better and would like to try some liquids.  GI Review of Systems Positive as above Negative for dysphagia, odynophagia, hematochezia   Review of Systems  General: Denies fevers/chills/weight loss unintentionally Cardiovascular: Denies chest pain Pulmonary: Denies shortness of breath Gastroenterological: See HPI Genitourinary: Denies darkened urine Hematological: Denies easy bruising/bleeding Endocrine: Denies temperature intolerance Dermatological: Denies jaundice Psychological: Mood is stable   Histories  Past Medical History Past Medical History:  Diagnosis Date   COLONIC POLYPS, BENIGN 01/20/2010   Annotation: 12/2009: path benign; rpt 2016 Qualifier: Diagnosis of  By: Jorene Minors, Scott     Hypertension    Past Surgical History:  Procedure Laterality Date   POLYPECTOMY     RIGHT/LEFT HEART CATH AND CORONARY ANGIOGRAPHY N/A 08/27/2016   Procedure: Right/Left Heart Cath and Coronary Angiography;  Surgeon: Sherren Mocha, MD;  Location: [place] [facility];  Service: Cardiovascular;  Laterality: N/A;    Allergies No Known Allergies  Family History Family History  Problem Relation Age of Onset   Diabetes Other    Hypertension Other    Colon cancer Neg Hx    Esophageal cancer Neg Hx    Inflammatory bowel disease Neg Hx    Liver disease Neg Hx    Pancreatic cancer Neg Hx    Gastric cancer Neg Hx    Rectal cancer Neg Hx     Social History Social History   Socioeconomic History   Marital status: Single    Spouse name: Not on file  Number of children: Not on file   Years of education: Not on file   Highest education level: Not on file  Occupational History   Not on file  Tobacco Use   Smoking status: Never   Smokeless tobacco: Never  Substance and Sexual Activity   Alcohol use: Yes    Comment: 3-4 Beers Daily   Drug use: Yes    Types: Marijuana    Comment: pt reports marijuana use occasionally   Sexual  activity: Yes    Birth control/protection: None  Other Topics Concern   Not on file  Social History Narrative   Not on file   Social Determinants of Health   Financial Resource Strain: Not on file  Food Insecurity: No Food Insecurity (02/15/2022)   Hunger Vital Sign    Worried About Running Out of Food in the Last Year: Never true    Ran Out of Food in the Last Year: Never true  Transportation Needs: No Transportation Needs (02/15/2022)   PRAPARE - Hydrologist (Medical): No    Lack of Transportation (Non-Medical): No  Physical Activity: Not on file  Stress: Not on file  Social Connections: Not on file  Intimate Partner Violence: Not At Risk (02/15/2022)   Humiliation, Afraid, Rape, and Kick questionnaire    Fear of Current or Ex-Partner: No    Emotionally Abused: No    Physically Abused: No    Sexually Abused: No    Medications  Home Medications No current facility-administered medications on file prior to encounter.   Current Outpatient Medications on File Prior to Encounter  Medication Sig Dispense Refill   carvedilol (COREG) 12.5 MG tablet Take 1 tablet (12.5 mg total) by mouth 2 (two) times daily. 180 tablet 1   sacubitril-valsartan (ENTRESTO) 97-103 MG Take 1 tablet by mouth 2 (two) times daily. 180 tablet 1   spironolactone (ALDACTONE) 25 MG tablet Take 0.5 tablets (12.5 mg total) by mouth daily. 45 tablet 1   tamsulosin (FLOMAX) 0.4 MG CAPS capsule Take 1 capsule (0.4 mg total) by mouth daily. 30 capsule 3   Scheduled Inpatient Medications  carvedilol  12.5 mg Oral BID   folic acid  1 mg Oral Daily   multivitamin with minerals  1 tablet Oral Daily   pantoprazole (PROTONIX) IV  40 mg Intravenous Q12H   potassium chloride  40 mEq Oral Daily   sacubitril-valsartan  1 tablet Oral BID   tamsulosin  0.4 mg Oral Daily   thiamine  100 mg Oral Daily   Or   thiamine  100 mg Intravenous Daily   Continuous Inpatient Infusions  sodium chloride  75 mL/hr at 02/15/22 1823   pantoprazole (PROTONIX) IV     PRN Inpatient Medications fentaNYL (SUBLIMAZE) injection, LORazepam **OR** LORazepam, ondansetron **OR** ondansetron (ZOFRAN) IV   Physical Examination  BP (!) 96/54 (BP Location: Right Arm)   Pulse 87   Temp 98.9 F (37.2 C) (Oral)   Resp 12   Ht _0  (1.905 m)   Wt 95.3 kg   SpO2 96%   BMI 26.26 kg/m  GEN: NAD, appears stated age, doesn't appear chronically ill PSYCH: Cooperative, without pressured speech EYE: Conjunctivae pink, sclerae anicteric ENT: MMM CV: Nontachycardic RESP: No audible wheezing GI: NABS, soft, protuberant abdomen, rounded, TTP in MEG, no rebound or guarding, mild hepatomegaly appreciated MSK/EXT: Bilateral lower extremity edema present trace SKIN: No jaundice NEURO:  Alert & Oriented x 3, no focal deficits, no evidence of asterixis  Review of Data  I reviewed the following data at the time of this encounter:  Laboratory Studies   Recent Labs  Lab 02/15/22 1651 02/16/22 0051  NA 134* 133*  K 3.4* 2.9*  CL 92* 95*  CO2 32 30  BUN 5* <5*  CREATININE 0.71 0.67  GLUCOSE 91 127*  CALCIUM 8.4* 8.3*  MG 1.4*  --    Recent Labs  Lab 02/16/22 0051  AST 117*  ALT 30  ALKPHOS 58    Recent Labs  Lab 02/14/22 1540 02/15/22 1651 02/16/22 0051  WBC 6.4 4.7 4.3  HGB 14.9 13.3 12.1*  HCT 42.6 39.0 35.4*  PLT 101* 92* 87*   No results for input(s): "APTT", "INR" in the last 168 hours. Computed MELD 3.0 unavailable. Necessary lab results were not found in the last year. Computed MELD-Na unavailable. Necessary lab results were not found in the last year.   Imaging Studies  CT abdomen pelvis with contrast IMPRESSION: 1. Mild inflammatory changes surrounding the pancreas and duodenum suspicious for pancreatitis/duodenitis. No organized fluid collection 2. Hepatic steatosis with findings suggestive of cirrhosis 3. Lucent circumscribed lesions in the L3, L4 and L5  vertebral bodies suggestive of benign process such as hemangioma but not definitive. Consider correlation with nonemergent MRI.  Abdominal ultrasound IMPRESSION: 1. Coarse echogenic liver parenchyma consistent with hepatic steatosis and or hepatocellular disease. 2. Otherwise negative right upper quadrant abdominal ultrasound   GI Procedures and Studies  2011 colonoscopy 1 sessile polyp in the ascending colon removed with hot snare. Otherwise normal examination. Pathology consistent with lymphoid aggregate   Assessment  Mr. Kuenzi is a 51 y.o. male with a pmh significant for CHF, hypertension, prior substance abuse, alcohol use disorder, MDD.  The GI service is consulted for evaluation and management of pancreatitis and reported coffee-ground emesis.  The patient is hemodynamically stable at this time.  He presents with evidence of pancreatitis and a normal hemoglobin.  He has reported 4 days worth of coffee-ground emesis as well as some reported dark stools.  He has evidence of thrombocytopenia as well as a significant history of alcohol use disorder.  I believe the etiology of his underlying pancreatitis is most likely alcohol related but we will check his triglycerides as well.  His current medications are not typical causes of pancreatitis so they can be maintained at this point.  He will need an MRI/MRCP in a few weeks time to ensure improvement of the reported pancreatitis/duodenitis as well as rule out of pancreatic disease.  In the setting of the reported coffee-ground emesis and darker stools the patient will need an upper endoscopy and with underlying coarsened echotexture liver the possibility of underlying cirrhosis should be considered as well even though no varices were noted.  Reasonable to continue PPI as has been ordered and I will add on octreotide as well and the small chance that varices are a reason for his symptoms.  We will let the patient stabilize over the course of  today and Friday and depending on how he is doing likely perform an endoscopy on Saturday.  Appreciate supplementation of electrolytes by the medicine service.  Agree with alcohol withdrawal pathway.  The risks and benefits of endoscopic evaluation were discussed with the patient; these include but are not limited to the risk of perforation, infection, bleeding, missed lesions, lack of diagnosis, severe illness requiring hospitalization, as well as anesthesia and sedation related illnesses.  The patient and/or family is agreeable to proceed.  All patient  questions were answered to the best of my ability, and the patient agrees to the aforementioned plan of action with follow-up as indicated.   Plan/Recommendations  Promote from n.p.o. to clear liquid diet Add on ESR/CRP to understand underlying inflammatory state on admission and recheck in 2 days Ionized calcium to be added tomorrow Triglycerides to be performed tomorrow Maintain clear liquid diet on 9/29 until reevaluated by GI medicine decide if full liquid diet may be reasonable if stable Continue PPI Initiate octreotide drip in the chance of underlying cirrhosis and varices as etiology for CGE and dark stools Initiate ceftriaxone 1 g daily Electrolyte supplementation as per medicine service Agree with alcohol withdrawal pathway Agree with thiamine and folate MRI/MRCP in 4 to 6 weeks as outpatient Tentative EGD this weekend (likely on 9/30)   Thank you for this consult.  We will continue to follow.  Please page/call with questions or concerns.   Justice Britain, MD [facility] Office # [phone]

## 2022-02-17 LAB — COMPREHENSIVE METABOLIC PANEL
ALT: 46 U/L — ABNORMAL HIGH (ref 0–44)
AST: 176 U/L — ABNORMAL HIGH (ref 15–41)
Albumin: 3.1 g/dL — ABNORMAL LOW (ref 3.5–5.0)
Alkaline Phosphatase: 69 U/L (ref 38–126)
Anion gap: 10 (ref 5–15)
BUN: <5 mg/dL — ABNORMAL LOW (ref 6–20)
CO2: 29 mmol/L (ref 22–32)
Calcium: 8.1 mg/dL — ABNORMAL LOW (ref 8.9–10.3)
Chloride: 95 mmol/L — ABNORMAL LOW (ref 98–111)
Creatinine, Ser: 0.51 mg/dL — ABNORMAL LOW (ref 0.61–1.24)
GFR, Estimated: >60 mL/min (ref >60)
Glucose, Bld: 92 mg/dL (ref 70–99)
Potassium: 3.4 mmol/L — ABNORMAL LOW (ref 3.5–5.1)
Sodium: 134 mmol/L — ABNORMAL LOW (ref 135–145)
Total Bilirubin: 1.5 mg/dL — ABNORMAL HIGH (ref 0.3–1.2)
Total Protein: 7.5 g/dL (ref 6.5–8.1)

## 2022-02-17 LAB — CBC
HCT: 42.3 % (ref 39.0–52.0)
Hemoglobin: 14.3 g/dL (ref 13.0–17.0)
MCH: 35.8 pg — ABNORMAL HIGH (ref 26.0–34.0)
MCHC: 33.8 g/dL (ref 30.0–36.0)
MCV: 106 fL — ABNORMAL HIGH (ref 80.0–100.0)
Platelets: 117 10*3/uL — ABNORMAL LOW (ref 150–400)
RBC: 3.99 MIL/uL — ABNORMAL LOW (ref 4.22–5.81)
RDW: 12 % (ref 11.5–15.5)
WBC: 3.4 10*3/uL — ABNORMAL LOW (ref 4.0–10.5)
nRBC: 0 % (ref 0.0–0.2)

## 2022-02-17 LAB — POTASSIUM: Potassium: 3.6 mmol/L (ref 3.5–5.1)

## 2022-02-17 LAB — CALCIUM, IONIZED: Calcium, Ionized, Serum: 4.6 mg/dL (ref 4.5–5.6)

## 2022-02-17 LAB — HCV INTERPRETATION

## 2022-02-17 LAB — MAGNESIUM
Magnesium: 1.7 mg/dL (ref 1.7–2.4)
Magnesium: 1.9 mg/dL (ref 1.7–2.4)

## 2022-02-17 LAB — HIV ANTIBODY (ROUTINE TESTING W REFLEX): HIV Screen 4th Generation wRfx: NONREACTIVE

## 2022-02-17 LAB — LIPASE, BLOOD: Lipase: 102 U/L — ABNORMAL HIGH (ref 11–51)

## 2022-02-17 MED ORDER — MAGNESIUM SULFATE 2 GM/50ML IV SOLN
2.0000 g | Freq: Once | INTRAVENOUS | Status: AC
  Administered 2022-02-17: 2 g via INTRAVENOUS
  Filled 2022-02-17: qty 50

## 2022-02-17 MED ORDER — SODIUM CHLORIDE 0.9 % IV SOLN: INTRAVENOUS | Status: DC

## 2022-02-17 MED ORDER — POTASSIUM CHLORIDE CRYS ER 20 MEQ PO TBCR
30.0000 meq | EXTENDED_RELEASE_TABLET | ORAL | Status: AC
  Administered 2022-02-17 (×2): 30 meq via ORAL
  Filled 2022-02-17 (×2): qty 1

## 2022-02-17 NOTE — Progress Notes (Signed)
PROGRESS NOTE    [name] ESQUIVIAS  [name]:[id number] DOB: [birth year] DOA: 02/14/2022 PCP: Dorna Mai, MD    Brief Narrative:   [name] [name] is a 51 y.o. male with past medical history significant for EtOH/marijuana abuse, EtOH cirrhosis, BPH, chronic systolic congestive heart failure who presented to [facility] ED on 9/27 with complaints of abdominal pain and coffee-ground emesis over the last 4 days.  Reports sharp epigastric/right upper quadrant abdominal pain with intermittent black stools.  He denies chest pain, no shortness of breath, no lightheadedness/dizziness.  Given persistence of symptoms he proceeded to urgent care/ED for further evaluation.  In the ED, temperature 97.8 F, HR 112, RR 18, BP 164/100, SPO2 94% on room air.  WBC 6.4, hemoglobin 14.9, platelets 101.  Sodium 133, potassium 3.0, chloride 87, CO2 26, glucose 129, BUN 7, creatinine 0.78.  Lipase 278.  AST 121, ALT 30, total bilirubin 2.3.  Urinalysis with greater than 80 ketones, trace hemoglobin, 30 protein, otherwise unrevealing.  CT abdomen/pelvis with mild inflammatory changes around the pancreas and duodenum suspicious for pancreatitis/duodenitis with no organized fluid collection, hepatic steatosis with findings suggestive of cirrhosis and lucent circumscribed lesions L3, L4 and L5 vertebral body suggestive of benign process such as hemangioma.  Gastroenterology was consulted.  TRH consulted for admission and patient was transferred to [facility] for further evaluation of hematemesis, pancreatitis/duodenitis.  Assessment & Plan:   Pancreatitis/duodenitis Patient presenting with epigastric/right upper quadrant abdominal pain associated with nausea/vomiting.  Continues with underlying alcohol abuse likely precipitating factor.  Lipase elevated on admission to 278.  CT abdomen/pelvis with mild inflammatory changes around the pancreas/duodenum consistent with pancreatitis/duodenitis.  CRP level  1.0. --Clear liquid diet --NS at 85m/h --Oxycodone 5 mg PO q6h PRN moderate pain --Pain control with fentanyl 1066m q4h PRN severe pain --Supportive care, antiemetics  Hematemesis Patient presenting to ED with reported several episodes of hematemesis with coffee-ground emesis.  This is in the setting of continued alcohol abuse.  Not on antiplatelet/anticoagulant outpatient. --[facility] following, appreciate assistance --Hgb 14.9>13.3>12.1>14.3; stable --CBC daily --Clear liquid diet --Protonix '40mg'$  IV q12h --octreotide drip --EGD planned 9/30, NPO after MN  Hypokalemia Hypomagnesemia  Potassium 3.4 and magnesium 1.7 this morning, will continue repletion. --Continue to monitor electrolytes daily to include magnesium  Hyponatremia Etiology likely secondary to beer potomania in the setting of continued alcohol abuse.  Sodium 134, stable. --BMP daily  Essential hypertension Hx Chronic systolic congestive heart failure, compensated BP 122/81 this morning.  Home regimen includes carvedilol 12.5 mg p.o. twice daily, Entresto 97-103 mg p.o. twice daily and Aldactone 12.5 mg p.o. daily.  TTE 07/30/2019 with LVEF 70-75%, LVEF was 15% in 2018. --Continue carvedilol 12.5 mg PO BID --Continue Entresto 97-103 mg PO BID --Holding home Aldactone --Strict I's and O's and daily weights  EtOH cirrhosis CT abdomen/pelvis with findings suggestive of cirrhosis.  Right upper quadrant ultrasound with coarse echogenic liver parenchyma consistent with hepatic steatosis versus hepatocellular disease, otherwise unrevealing.  Discussed need for complete alcohol cessation.  EtOH use disorder AST/ALT ratio consistent with continued alcohol abuse.  Counseled on need for complete cessation. --CIWA protocol with symptom triggered Ativan --Thiamine, folate, multivitamin --TOC for substance abuse resources  BPH: Continue tamsulosin 0.4 mg p.o. daily  Incidental finding of lumbar vertebral  lesions Incidental finding of lucent circumscribed lesions L3, L4, and L5 vertebral body suggestive of benign process such as an hemangioma.  Recommend outpatient follow-up with dedicated MRI.   DVT prophylaxis: SCDs Start: 02/15/22  1719    Code Status: Full Code Family Communication: No family present at bedside this morning  Disposition Plan:  Level of care: Med-Surg Status is: Inpatient Remains inpatient appropriate because: Pending EGD tomorrow, will need toleration of advance diet before stable for discharge home    Consultants:  [facility]  Procedures:  None  Antimicrobials:  Ceftriaxone 9/29 - 9/29   Subjective: Patient seen and examined at bedside, resting comfortably lying in bed.  Abdominal discomfort much improved.  Tolerating clear liquid diet.  Discussed that EGD will be now tomorrow to ensure his electrolytes are appropriately replaced.  Asked "am I going to stay on a clear liquid diet today".  No other specific questions or concerns at this time.  Denies headache, no dizziness, no chest pain, no palpitations, no shortness of breath, no cough/congestion, no fever/chills/night sweats, no current nausea/vomiting, no diarrhea, no focal weakness, no fatigue, no paresthesias.  No acute events overnight per nursing staff.  Objective: Vitals:   02/16/22 2057 02/17/22 0005 02/17/22 0450 02/17/22 0500  BP: 123/85 (!) 141/107 104/80   Pulse: 85 90 75   Resp: '18 16 18   '$ Temp: 98.8 F (37.1 C) 98.9 F (37.2 C) 98.3 F (36.8 C)   TempSrc: Oral Oral    SpO2: 99% 97% 100%   Weight:    86.8 kg  Height:        Intake/Output Summary (Last 24 hours) at 02/17/2022 1119 Last data filed at 02/17/2022 0500 Gross per 24 hour  Intake --  Output 1150 ml  Net -1150 ml   Filed Weights   02/14/22 1534 02/17/22 0500  Weight: 95.3 kg 86.8 kg    Examination:  Physical Exam: GEN: NAD, alert and oriented x 3, chronically ill in appearance, appears older than stated age HEENT:  NCAT, PERRL, EOMI, sclera clear, MMM PULM: CTAB w/o wheezes/crackles, normal respiratory effort, on room air CV: RRR w/o M/G/R GI: abd soft, mild epigastric/right upper quadrant tenderness, NABS, no R/G/M MSK: no peripheral edema, muscle strength globally intact 5/5 bilateral upper/lower extremities NEURO: CN II-XII intact, no focal deficits, sensation to light touch intact PSYCH: normal mood/affect Integumentary: dry/intact, no rashes or wounds    Data Reviewed: I have personally reviewed following labs and imaging studies  CBC: Recent Labs  Lab 02/14/22 1540 02/15/22 1651 02/16/22 0051 02/17/22 0622  WBC 6.4 4.7 4.3 3.4*  NEUTROABS  --  2.6  --   --   HGB 14.9 13.3 12.1* 14.3  HCT 42.6 39.0 35.4* 42.3  MCV 103.4* 106.6* 105.7* 106.0*  PLT 101* 92* 87* 387*   Basic Metabolic Panel: Recent Labs  Lab 02/14/22 1540 02/15/22 1651 02/16/22 0051 02/16/22 1610 02/17/22 0622  NA 133* 134* 133*  --  134*  K 3.0* 3.4* 2.9* 3.9 3.4*  CL 87* 92* 95*  --  95*  CO2 26 32 30  --  29  GLUCOSE 129* 91 127*  --  92  BUN 7 5* <5*  --  <5*  CREATININE 0.78 0.71 0.67  --  0.51*  CALCIUM 10.0 8.4* 8.3*  --  8.1*  MG  --  1.4*  --  1.2* 1.7   GFR: Estimated Creatinine Clearance: 127.6 mL/min (A) (by C-G formula based on SCr of 0.51 mg/dL (L)). Liver Function Tests: Recent Labs  Lab 02/14/22 1540 02/15/22 1651 02/16/22 0051 02/17/22 0622  AST 121* 114* 117* 176*  ALT '30 31 30 '$ 46*  ALKPHOS 75 63 58 69  BILITOT 2.3* 2.4*  1.7*  2.0* 1.5*  PROT 9.3* 7.3 6.6 7.5  ALBUMIN 4.3 3.1* 2.8* 3.1*   Recent Labs  Lab 02/14/22 1540 02/17/22 0622  LIPASE 278* 102*   No results for input(s): "AMMONIA" in the last 168 hours. Coagulation Profile: No results for input(s): "INR", "PROTIME" in the last 168 hours. Cardiac Enzymes: No results for input(s): "CKTOTAL", "CKMB", "CKMBINDEX", "TROPONINI" in the last 168 hours. BNP (last 3 results) No results for input(s): "PROBNP" in the last  8760 hours. HbA1C: No results for input(s): "HGBA1C" in the last 72 hours. CBG: No results for input(s): "GLUCAP" in the last 168 hours. Lipid Profile: Recent Labs    02/16/22 0051  TRIG 53   Thyroid Function Tests: No results for input(s): "TSH", "T4TOTAL", "FREET4", "T3FREE", "THYROIDAB" in the last 72 hours. Anemia Panel: No results for input(s): "VITAMINB12", "FOLATE", "FERRITIN", "TIBC", "IRON", "RETICCTPCT" in the last 72 hours. Sepsis Labs: No results for input(s): "PROCALCITON", "LATICACIDVEN" in the last 168 hours.  No results found for this or any previous visit (from the past 240 hour(s)).       Radiology Studies: US Abdomen Limited RUQ (LIVER/GB)  Result Date: 02/15/2022 CLINICAL DATA:  Elevated LFT EXAM: ULTRASOUND ABDOMEN LIMITED RIGHT UPPER QUADRANT COMPARISON:  CT 02/14/2022 FINDINGS: Gallbladder: No gallstones or wall thickening visualized. No sonographic Murphy sign noted by sonographer. Common bile duct: Diameter: 4.4 mm Liver: Echogenic liver parenchyma. No focal hepatic abnormality. Portal vein is patent on color Doppler imaging with normal direction of blood flow towards the liver. Other: None. IMPRESSION: 1. Coarse echogenic liver parenchyma consistent with hepatic steatosis and or hepatocellular disease. 2. Otherwise negative right upper quadrant abdominal ultrasound Electronically Signed   By: Donavan Foil M.D.   On: 02/15/2022 19:26        Scheduled Meds:  carvedilol  12.5 mg Oral BID   folic acid  1 mg Oral Daily   multivitamin with minerals  1 tablet Oral Daily   pantoprazole (PROTONIX) IV  40 mg Intravenous Q12H   potassium chloride  30 mEq Oral Q3H   sacubitril-valsartan  1 tablet Oral BID   tamsulosin  0.4 mg Oral Daily   thiamine  100 mg Oral Daily   Or   thiamine  100 mg Intravenous Daily   Continuous Infusions:  sodium chloride 75 mL/hr at 02/15/22 1823   sodium chloride 75 mL/hr at 02/16/22 1832   pantoprazole (PROTONIX) IV        LOS: 1 day    Time spent: 50 minutes spent on chart review, discussion with nursing staff, consultants, updating family and interview/physical exam; more than 50% of that time was spent in counseling and/or coordination of care.    Oshua Mcconaha J [place] DO [facility] Available via Epic secure chat 7am-7pm After these hours, please refer to coverage provider listed on [email or web address] 02/17/2022, 11:19 AM

## 2022-02-17 NOTE — Progress Notes (Addendum)
[place] [facility] ROUNDING NOTE   Subjective: No acute events overnight.  Feeling better today.  Tolerating liquid diet and pain much improved.   Objective: Vital signs in last 24 hours: Temp:  [98.2 F (36.8 C)-98.9 F (37.2 C)] 98.3 F (36.8 C) (09/30 0450) Pulse Rate:  [75-90] 75 (09/30 0450) Resp:  [16-18] 18 (09/30 0450) BP: (104-141)/(80-107) 104/80 (09/30 0450) SpO2:  [97 %-100 %] 100 % (09/30 0450) Weight:  [86.8 kg] 86.8 kg (09/30 0500) Last BM Date : 02/14/22 General: NAD Lungs:  CTA b/l, no w/r/r Heart:  RRR, no m/r/g Abdomen:  Soft, NT, ND, +BS   Intake/Output from previous day: 09/29 0701 - 09/30 0700 In: 1200 [P.O.:1200] Out: 1800 [Urine:1800] Intake/Output this shift: Total I/O In: 480 [P.O.:480] Out: -    Lab Results: Recent Labs    02/15/22 1651 02/16/22 0051 02/17/22 0622  WBC 4.7 4.3 3.4*  HGB 13.3 12.1* 14.3  PLT 92* 87* 117*  MCV 106.6* 105.7* 106.0*   BMET Recent Labs    02/15/22 1651 02/16/22 0051 02/16/22 1610 02/17/22 0622  NA 134* 133*  --  134*  K 3.4* 2.9* 3.9 3.4*  CL 92* 95*  --  95*  CO2 32 30  --  29  GLUCOSE 91 127*  --  92  BUN 5* <5*  --  <5*  CREATININE 0.71 0.67  --  0.51*  CALCIUM 8.4* 8.3*  --  8.1*   LFT Recent Labs    02/15/22 1651 02/16/22 0051 02/17/22 0622  PROT 7.3 6.6 7.5  ALBUMIN 3.1* 2.8* 3.1*  AST 114* 117* 176*  ALT 31 30 46*  ALKPHOS 63 58 69  BILITOT 2.4* 1.7*  2.0* 1.5*  BILIDIR  --  0.7*  --   IBILI  --  1.0*  --    PT/INR No results for input(s): "INR" in the last 72 hours.    Imaging/Other results: US Abdomen Limited RUQ (LIVER/GB)  Result Date: 02/15/2022 CLINICAL DATA:  Elevated LFT EXAM: ULTRASOUND ABDOMEN LIMITED RIGHT UPPER QUADRANT COMPARISON:  CT 02/14/2022 FINDINGS: Gallbladder: No gallstones or wall thickening visualized. No sonographic Murphy sign noted by sonographer. Common bile duct: Diameter: 4.4 mm Liver: Echogenic liver parenchyma. No focal hepatic  abnormality. Portal vein is patent on color Doppler imaging with normal direction of blood flow towards the liver. Other: None. IMPRESSION: 1. Coarse echogenic liver parenchyma consistent with hepatic steatosis and or hepatocellular disease. 2. Otherwise negative right upper quadrant abdominal ultrasound Electronically Signed   By: Donavan Foil M.D.   On: 02/15/2022 19:26      Assessment and Plan:  1) Alcoholic pancreatitis 2) Duodenitis 3) Hematemesis Suspect EtOH pancreatitis.  Evaluation thus far otherwise unrevealing.  Lipase appropriately downtrending and otherwise without renal failure or significant hemoconcentration. - Tolerating clear liquids - N.p.o. at midnight - Pain control per primary Hospitalist service - Plan for EGD tomorrow for evaluation of isolated hematemesis without anemia (suspect Hgb 12.1 yesterday was spurious as it is back to 14.3 today) - Continue PPI - Outpatient MRCP - CIWA - Continue thiamine, folate, MVI  4) Hypokalemia 5) Hypomagnesemia - Repletion per primary Hospitalist service - As above, plan for EGD tomorrow pending electrolyte stability  6) Cirrhotic appearing liver on CT 7) Elevated liver enzymes Admission CT with subtle surface nodularity of the liver.  Increased echotexture noted on ultrasound.  Otherwise no radiographic e/o portal hypertension.  Does have thrombocytopenia (improved today) and mildly elevated T. bili (also improved today).  Albumin normal  on arrival - Evaluate for esophageal varices, portal hypertensive gastropathy, etc. time of EGD as above - Liver enzyme elevation pattern c/w EtOH.  Continue trending  8) Hyponatremia - Suspected 2/2 beer potomania per Hospitalist.  NA stable - As above, can evaluate for e/o portal hypertension at time of EGD  The indications, risks, and benefits of EGD were explained to the patient in detail. Risks include but are not limited to bleeding, perforation, adverse reaction to medications, and  cardiopulmonary compromise. Sequelae include but are not limited to the possibility of surgery, hospitalization, and mortality. The patient verbalized understanding and wished to proceed.    Lavena Bullion, DO  02/17/2022, 1:38 PM [facility] Pager [phone]

## 2022-02-17 NOTE — H&P (View-Only) (Signed)
GASTROENTEROLOGY ROUNDING NOTE   Subjective: No acute events overnight.  Feeling better today.  Tolerating liquid diet and pain much improved.   Objective: Vital signs in last 24 hours: Temp:  [98.2 F (36.8 C)-98.9 F (37.2 C)] 98.3 F (36.8 C) (09/30 0450) Pulse Rate:  [75-90] 75 (09/30 0450) Resp:  [16-18] 18 (09/30 0450) BP: (104-141)/(80-107) 104/80 (09/30 0450) SpO2:  [97 %-100 %] 100 % (09/30 0450) Weight:  [86.8 kg] 86.8 kg (09/30 0500) Last BM Date : 02/14/22 General: NAD Lungs:  CTA b/l, no w/r/r Heart:  RRR, no m/r/g Abdomen:  Soft, NT, ND, +BS   Intake/Output from previous day: 09/29 0701 - 09/30 0700 In: 1200 [P.O.:1200] Out: 1800 [Urine:1800] Intake/Output this shift: Total I/O In: 480 [P.O.:480] Out: -    Lab Results: Recent Labs    02/15/22 1651 02/16/22 0051 02/17/22 0622  WBC 4.7 4.3 3.4*  HGB 13.3 12.1* 14.3  PLT 92* 87* 117*  MCV 106.6* 105.7* 106.0*   BMET Recent Labs    02/15/22 1651 02/16/22 0051 02/16/22 1610 02/17/22 0622  NA 134* 133*  --  134*  K 3.4* 2.9* 3.9 3.4*  CL 92* 95*  --  95*  CO2 32 30  --  29  GLUCOSE 91 127*  --  92  BUN 5* <5*  --  <5*  CREATININE 0.71 0.67  --  0.51*  CALCIUM 8.4* 8.3*  --  8.1*   LFT Recent Labs    02/15/22 1651 02/16/22 0051 02/17/22 0622  PROT 7.3 6.6 7.5  ALBUMIN 3.1* 2.8* 3.1*  AST 114* 117* 176*  ALT 31 30 46*  ALKPHOS 63 58 69  BILITOT 2.4* 1.7*  2.0* 1.5*  BILIDIR  --  0.7*  --   IBILI  --  1.0*  --    PT/INR No results for input(s): "INR" in the last 72 hours.    Imaging/Other results: US Abdomen Limited RUQ (LIVER/GB)  Result Date: 02/15/2022 CLINICAL DATA:  Elevated LFT EXAM: ULTRASOUND ABDOMEN LIMITED RIGHT UPPER QUADRANT COMPARISON:  CT 02/14/2022 FINDINGS: Gallbladder: No gallstones or wall thickening visualized. No sonographic Murphy sign noted by sonographer. Common bile duct: Diameter: 4.4 mm Liver: Echogenic liver parenchyma. No focal hepatic  abnormality. Portal vein is patent on color Doppler imaging with normal direction of blood flow towards the liver. Other: None. IMPRESSION: 1. Coarse echogenic liver parenchyma consistent with hepatic steatosis and or hepatocellular disease. 2. Otherwise negative right upper quadrant abdominal ultrasound Electronically Signed   By: Donavan Foil M.D.   On: 02/15/2022 19:26      Assessment and Plan:  1) Alcoholic pancreatitis 2) Duodenitis 3) Hematemesis Suspect EtOH pancreatitis.  Evaluation thus far otherwise unrevealing.  Lipase appropriately downtrending and otherwise without renal failure or significant hemoconcentration. - Tolerating clear liquids - N.p.o. at midnight - Pain control per primary Hospitalist service - Plan for EGD tomorrow for evaluation of isolated hematemesis without anemia (suspect Hgb 12.1 yesterday was spurious as it is back to 14.3 today) - Continue PPI - Outpatient MRCP - CIWA - Continue thiamine, folate, MVI  4) Hypokalemia 5) Hypomagnesemia - Repletion per primary Hospitalist service - As above, plan for EGD tomorrow pending electrolyte stability  6) Cirrhotic appearing liver on CT 7) Elevated liver enzymes Admission CT with subtle surface nodularity of the liver.  Increased echotexture noted on ultrasound.  Otherwise no radiographic e/o portal hypertension.  Does have thrombocytopenia (improved today) and mildly elevated T. bili (also improved today).  Albumin normal  on arrival - Evaluate for esophageal varices, portal hypertensive gastropathy, etc. time of EGD as above - Liver enzyme elevation pattern c/w EtOH.  Continue trending  8) Hyponatremia - Suspected 2/2 beer potomania per Hospitalist.  NA stable - As above, can evaluate for e/o portal hypertension at time of EGD  The indications, risks, and benefits of EGD were explained to the patient in detail. Risks include but are not limited to bleeding, perforation, adverse reaction to medications, and  cardiopulmonary compromise. Sequelae include but are not limited to the possibility of surgery, hospitalization, and mortality. The patient verbalized understanding and wished to proceed.    Lavena Bullion, DO  02/17/2022, 1:38 PM [facility] Pager [phone]

## 2022-02-18 ENCOUNTER — Inpatient Hospital Stay (HOSPITAL_COMMUNITY): Payer: Medicaid Other | Admitting: Certified Registered Nurse Anesthetist

## 2022-02-18 ENCOUNTER — Encounter (HOSPITAL_COMMUNITY): Payer: Self-pay | Admitting: Internal Medicine

## 2022-02-18 ENCOUNTER — Encounter (HOSPITAL_COMMUNITY)
Admission: EM | Disposition: A | Discharge status: Home / Self Care | Payer: Self-pay | Source: Home / Self Care | Attending: Internal Medicine

## 2022-02-18 DIAGNOSIS — K2970 Gastritis, unspecified, without bleeding: Secondary | ICD-10-CM

## 2022-02-18 DIAGNOSIS — I5022 Chronic systolic (congestive) heart failure: Secondary | ICD-10-CM

## 2022-02-18 DIAGNOSIS — I110 Hypertensive heart disease with heart failure: Secondary | ICD-10-CM

## 2022-02-18 DIAGNOSIS — K449 Diaphragmatic hernia without obstruction or gangrene: Secondary | ICD-10-CM

## 2022-02-18 HISTORY — PX: BIOPSY: SHX5522

## 2022-02-18 HISTORY — PX: ESOPHAGOGASTRODUODENOSCOPY: SHX5428

## 2022-02-18 LAB — CBC
HCT: 42.7 % (ref 39.0–52.0)
Hemoglobin: 14.6 g/dL (ref 13.0–17.0)
MCH: 36.2 pg — ABNORMAL HIGH (ref 26.0–34.0)
MCHC: 34.2 g/dL (ref 30.0–36.0)
MCV: 106 fL — ABNORMAL HIGH (ref 80.0–100.0)
Platelets: 135 10*3/uL — ABNORMAL LOW (ref 150–400)
RBC: 4.03 MIL/uL — ABNORMAL LOW (ref 4.22–5.81)
RDW: 12.5 % (ref 11.5–15.5)
WBC: 4.6 10*3/uL (ref 4.0–10.5)
nRBC: 0 % (ref 0.0–0.2)

## 2022-02-18 LAB — COMPREHENSIVE METABOLIC PANEL
ALT: 58 U/L — ABNORMAL HIGH (ref 0–44)
AST: 165 U/L — ABNORMAL HIGH (ref 15–41)
Albumin: 3.4 g/dL — ABNORMAL LOW (ref 3.5–5.0)
Alkaline Phosphatase: 67 U/L (ref 38–126)
Anion gap: 9 (ref 5–15)
BUN: <5 mg/dL — ABNORMAL LOW (ref 6–20)
CO2: 29 mmol/L (ref 22–32)
Calcium: 9 mg/dL (ref 8.9–10.3)
Chloride: 100 mmol/L (ref 98–111)
Creatinine, Ser: 0.86 mg/dL (ref 0.61–1.24)
GFR, Estimated: >60 mL/min (ref >60)
Glucose, Bld: 118 mg/dL — ABNORMAL HIGH (ref 70–99)
Potassium: 4.2 mmol/L (ref 3.5–5.1)
Sodium: 138 mmol/L (ref 135–145)
Total Bilirubin: 1.5 mg/dL — ABNORMAL HIGH (ref 0.3–1.2)
Total Protein: 8.2 g/dL — ABNORMAL HIGH (ref 6.5–8.1)

## 2022-02-18 LAB — MAGNESIUM: Magnesium: 1.8 mg/dL (ref 1.7–2.4)

## 2022-02-18 SURGERY — EGD (ESOPHAGOGASTRODUODENOSCOPY)
Anesthesia: Monitor Anesthesia Care

## 2022-02-18 MED ORDER — LACTATED RINGERS IV SOLN
INTRAVENOUS | Status: AC | PRN
  Administered 2022-02-18: 20 mL/h via INTRAVENOUS

## 2022-02-18 MED ORDER — ADULT MULTIVITAMIN W/MINERALS CH: 1.0000 | ORAL_TABLET | Freq: Every day | ORAL | Status: AC

## 2022-02-18 MED ORDER — CARVEDILOL 12.5 MG PO TABS
12.5000 mg | ORAL_TABLET | Freq: Two times a day (BID) | ORAL | 2 refills | Status: DC
  Filled 2022-02-18: qty 60, 30d supply, fill #0

## 2022-02-18 MED ORDER — SPIRONOLACTONE 25 MG PO TABS
12.5000 mg | ORAL_TABLET | Freq: Every day | ORAL | 2 refills | Status: DC
  Filled 2022-02-18: qty 3, 6d supply, fill #0
  Filled 2022-02-19: qty 12, 24d supply, fill #0

## 2022-02-18 MED ORDER — PROPOFOL 500 MG/50ML IV EMUL
INTRAVENOUS | Status: DC | PRN
  Administered 2022-02-18: 60 mg via INTRAVENOUS
  Administered 2022-02-18: 75 ug/kg/min via INTRAVENOUS
  Administered 2022-02-18: 40 mg via INTRAVENOUS

## 2022-02-18 MED ORDER — OMEPRAZOLE 20 MG PO CPDR
DELAYED_RELEASE_CAPSULE | ORAL | 0 refills | Status: DC
  Filled 2022-02-18: qty 174, 132d supply, fill #0
  Filled 2022-02-19: qty 60, 30d supply, fill #0

## 2022-02-18 MED ORDER — TAMSULOSIN HCL 0.4 MG PO CAPS
0.4000 mg | ORAL_CAPSULE | Freq: Every day | ORAL | 2 refills | Status: DC
  Filled 2022-02-18: qty 30, 30d supply, fill #0

## 2022-02-18 MED ORDER — FOLIC ACID 1 MG PO TABS
1.0000 mg | ORAL_TABLET | Freq: Every day | ORAL | 2 refills | Status: DC
  Filled 2022-02-18: qty 30, 30d supply, fill #0
  Filled 2022-05-28: qty 30, 30d supply, fill #1
  Filled 2022-10-09: qty 30, 30d supply, fill #2

## 2022-02-18 MED ORDER — THIAMINE HCL 100 MG PO TABS
100.0000 mg | ORAL_TABLET | Freq: Every day | ORAL | 0 refills | Status: DC
  Filled 2022-02-18: qty 30, 30d supply, fill #0
  Filled 2022-09-25: qty 100, 100d supply, fill #0

## 2022-02-18 MED ORDER — PANTOPRAZOLE SODIUM 20 MG PO TBEC
20.0000 mg | DELAYED_RELEASE_TABLET | Freq: Two times a day (BID) | ORAL | Status: DC
  Administered 2022-02-18: 20 mg via ORAL
  Filled 2022-02-18: qty 1

## 2022-02-18 NOTE — Progress Notes (Signed)
Pt has returned from Endo. Alert and oriented.  Something to drink has been given.

## 2022-02-18 NOTE — Progress Notes (Signed)
Pt has left hospital with his nephew. Alert and oriented. Discharge instructions given/explained with pt verbalizing understanding.

## 2022-02-18 NOTE — Transfer of Care (Signed)
Immediate Anesthesia Transfer of Care Note  Patient: [name] [name]  Procedure(s) Performed: Procedure(s): ESOPHAGOGASTRODUODENOSCOPY (EGD) (N/A) BIOPSY  Patient Location: PACU and Endoscopy Unit  Anesthesia Type:MAC  Level of Consciousness: awake, alert  and oriented  Airway & Oxygen Therapy: Patient Spontanous Breathing and Patient connected to nasal cannula oxygen  Post-op Assessment: Report given to RN and Post -op Vital signs reviewed and stable  Post vital signs: Reviewed and stable  Last Vitals:  Vitals:   02/18/22 0409 02/18/22 0715  BP: 109/77 118/88  Pulse: 83 78  Resp: 16 13  Temp: 36.8 C 36.9 C  SpO2: 05% 397%    Complications: No apparent anesthesia complications

## 2022-02-18 NOTE — Plan of Care (Signed)
Pt will be going home today. Alert and oriented.

## 2022-02-18 NOTE — Discharge Summary (Signed)
Physician Discharge Summary  [name] [name] OFB:[id number] DOB: [birth year] DOA: 02/14/2022  PCP: Dorna Mai, MD  Admit date: 02/14/2022 Discharge date: 02/18/2022  Admitted From: Home Disposition: Home  Recommendations for Outpatient Follow-up:  Follow up with PCP in 1-2 weeks Follow-up with gastroenterology; clinic to arrange follow-up appointment Continue omeprazole 20 g p.o. twice daily x6 weeks followed by once daily Continue to discuss alcohol cessation GI recommends outpatient MRCP Outpatient MRI for vertebral lesions noted incidentally on CT abdomen/pelvis. Follow-up gastric biopsies that were pending at time of discharge Recommend repeat CBC/CMP, magnesium level in 1 week  Home Health: No Equipment/Devices: None  Discharge Condition: Stable CODE STATUS: Full code Diet recommendation: Heart healthy diet  History of present illness:  [name] [name] is a 51 y.o. male with past medical history significant for EtOH/marijuana abuse, EtOH cirrhosis, BPH, chronic systolic congestive heart failure who presented to [place] ED on 9/27 with complaints of abdominal pain and coffee-ground emesis over the last 4 days.  Reports sharp epigastric/right upper quadrant abdominal pain with intermittent black stools.  He denies chest pain, no shortness of breath, no lightheadedness/dizziness.  Given persistence of symptoms he proceeded to urgent care/ED for further evaluation.   In the ED, temperature 97.8 F, HR 112, RR 18, BP 164/100, SPO2 94% on room air.  WBC 6.4, hemoglobin 14.9, platelets 101.  Sodium 133, potassium 3.0, chloride 87, CO2 26, glucose 129, BUN 7, creatinine 0.78.  Lipase 278.  AST 121, ALT 30, total bilirubin 2.3.  Urinalysis with greater than 80 ketones, trace hemoglobin, 30 protein, otherwise unrevealing.  CT abdomen/pelvis with mild inflammatory changes around the pancreas and duodenum suspicious for pancreatitis/duodenitis with no organized fluid collection,  hepatic steatosis with findings suggestive of cirrhosis and lucent circumscribed lesions L3, L4 and L5 vertebral body suggestive of benign process such as hemangioma.  Gastroenterology was consulted.  TRH consulted for admission and patient was transferred to [facility] for further evaluation of hematemesis, pancreatitis/duodenitis.  Hospital course:  Pancreatitis/duodenitis Patient presenting with epigastric/right upper quadrant abdominal pain associated with nausea/vomiting.  Continues with underlying alcohol abuse likely precipitating factor.  Lipase elevated on admission to 278.  CT abdomen/pelvis with mild inflammatory changes around the pancreas/duodenum consistent with pancreatitis/duodenitis.  CRP level 1.0. Patient was started on clear liquid diet, supported with IV fluid hydration and diet was slowly advanced with toleration.    GI recommends outpatient MRCP and outpatient follow-up in which their clinic will schedule.   Hematemesis 2/2 gastritis Patient presenting to ED with reported several episodes of hematemesis with coffee-ground emesis.  This is in the setting of continued alcohol abuse.  Not on antiplatelet/anticoagulant outpatient. Patient underwent EGD on 10/1 with findings of normal esophagus, 2 cm hiatal hernia, gastritis s/p biopsy, normal duodenum and no esophageal or gastric varices noted.  Patient will continue Prilosec 20 mg p.o. twice daily for 6 weeks followed by 20 mg daily thereafter.   Hypokalemia Hypomagnesemia  Repleted during hospitalization, etiology likely secondary to GI loss.  Recommend repeat labs 1 week.   Hyponatremia: Resolved Etiology likely secondary to beer potomania in the setting of continued alcohol abuse.  Sodium improved to 138 at time of discharge.  Continue to encourage alcohol cessation.  Repeat BMP 1 week.   Essential hypertension Hx Chronic systolic congestive heart failure, compensated BP 122/81 this morning.  Home regimen includes  carvedilol 12.5 mg p.o. twice daily, Entresto 97-103 mg p.o. twice daily and Aldactone 12.5 mg p.o. daily.  TTE 07/30/2019  with LVEF 70-75%, LVEF was 15% in 2018.  Outpatient follow-up with cardiology.   EtOH cirrhosis CT abdomen/pelvis with findings suggestive of cirrhosis.  Right upper quadrant ultrasound with coarse echogenic liver parenchyma consistent with hepatic steatosis versus hepatocellular disease, otherwise unrevealing.  Discussed need for complete alcohol cessation.   EtOH use disorder AST/ALT ratio consistent with continued alcohol abuse.  Counseled on need for complete cessation.  Seen by social work for substance abuse resources.  BPH: Continue tamsulosin 0.4 mg p.o. daily   Incidental finding of lumbar vertebral lesions Incidental finding of lucent circumscribed lesions L3, L4, and L5 vertebral body suggestive of benign process such as an hemangioma.  Recommend outpatient follow-up with dedicated MRI.  Discharge Diagnoses:  Principal Problem:   Duodenitis Active Problems:   Hypokalemia   Essential hypertension   ETOH abuse   Chronic systolic CHF (congestive heart failure) (HCC)   Benign prostatic hyperplasia with nocturia   Cirrhosis of liver (HCC)   Elevated LFTs   Hyponatremia   Lesion of lumbar spine   Acute pancreatitis   Hematemesis with nausea   Thrombocytopenia (HCC)   Gastritis and gastroduodenitis   Hiatal hernia    Discharge Instructions  Discharge Instructions     Call MD for:  difficulty breathing, headache or visual disturbances   Complete by: As directed    Call MD for:  extreme fatigue   Complete by: As directed    Call MD for:  persistant dizziness or light-headedness   Complete by: As directed    Call MD for:  persistant nausea and vomiting   Complete by: As directed    Call MD for:  severe uncontrolled pain   Complete by: As directed    Call MD for:  temperature >100.4   Complete by: As directed    Diet - low sodium heart healthy    Complete by: As directed    Increase activity slowly   Complete by: As directed       Allergies as of 02/18/2022   No Known Allergies      Medication List     TAKE these medications    carvedilol 12.5 MG tablet Commonly known as: COREG Take 1 tablet (12.5 mg total) by mouth 2 (two) times daily.   Entresto 97-103 MG Generic drug: sacubitril-valsartan Take 1 tablet by mouth 2 (two) times daily.   folic acid 1 MG tablet Commonly known as: FOLVITE Take 1 tablet (1 mg total) by mouth daily. Start taking on: February 19, 2022   multivitamin with minerals Tabs tablet Take 1 tablet by mouth daily. Start taking on: February 19, 2022   omeprazole 20 MG tablet Commonly known as: PriLOSEC OTC Take 1 tablet (20 mg total) by mouth 2 (two) times daily for 42 days, THEN 1 tablet (20 mg total) daily. Start taking on: February 18, 2022   spironolactone 25 MG tablet Commonly known as: ALDACTONE Take 0.5 tablets (12.5 mg total) by mouth daily.   tamsulosin 0.4 MG Caps capsule Commonly known as: FLOMAX Take 1 capsule (0.4 mg total) by mouth daily.   thiamine 100 MG tablet Commonly known as: Vitamin B-1 Take 1 tablet (100 mg total) by mouth daily. Start taking on: February 19, 2022        Follow-up Information     Dorna Mai, MD. Schedule an appointment as soon as possible for a visit in 1 week(s).   Specialty: Family Medicine Contact information: [address] [place] [address] [phone]  [facility]. Schedule an appointment as soon as possible for a visit.   Specialty: Gastroenterology Contact information: [address] [place] [address] [phone]               No Known Allergies  Consultations: [facility], Dr. Bryan Lemma   Procedures/Studies: US Abdomen Limited RUQ (LIVER/GB)  Result Date: 02/15/2022 CLINICAL DATA:  Elevated LFT EXAM: ULTRASOUND ABDOMEN LIMITED RIGHT  UPPER QUADRANT COMPARISON:  CT 02/14/2022 FINDINGS: Gallbladder: No gallstones or wall thickening visualized. No sonographic Murphy sign noted by sonographer. Common bile duct: Diameter: 4.4 mm Liver: Echogenic liver parenchyma. No focal hepatic abnormality. Portal vein is patent on color Doppler imaging with normal direction of blood flow towards the liver. Other: None. IMPRESSION: 1. Coarse echogenic liver parenchyma consistent with hepatic steatosis and or hepatocellular disease. 2. Otherwise negative right upper quadrant abdominal ultrasound Electronically Signed   By: Donavan Foil M.D.   On: 02/15/2022 19:26   CT ABDOMEN PELVIS W CONTRAST  Result Date: 02/14/2022 CLINICAL DATA:  Nausea vomiting elevated lipase EXAM: CT ABDOMEN AND PELVIS WITH CONTRAST TECHNIQUE: Multidetector CT imaging of the abdomen and pelvis was performed using the standard protocol following bolus administration of intravenous contrast. RADIATION DOSE REDUCTION: This exam was performed according to the departmental dose-optimization program which includes automated exposure control, adjustment of the mA and/or kV according to patient size and/or use of iterative reconstruction technique. CONTRAST:  32m OMNIPAQUE IOHEXOL 300 MG/ML  SOLN COMPARISON:  CT 08/20/2016 FINDINGS: Lower chest: Lung bases demonstrate no acute airspace disease. Hepatobiliary: Hepatic steatosis. Subtle surface nodularity suspicious for cirrhosis. No calcified gallstone or biliary dilatation Pancreas: Inflammatory changes at the pancreaticoduodenal groove with trace fluid and inflammation in the right anterior pararenal space. Spleen: Normal in size without focal abnormality. Adrenals/Urinary Tract: Adrenal glands are normal. No hydronephrosis. Chronic peripherally calcified benign-appearing lesion superior pole left kidney, no imaging follow-up is recommended. The bladder is unremarkable Stomach/Bowel: The stomach is nonenlarged. No dilated small bowel. Negative  appendix. Vascular/Lymphatic: Moderate aortic atherosclerosis. No aneurysm. No suspicious lymph nodes Reproductive: Prostate is unremarkable. Other: Negative for pelvic effusion or free air. Musculoskeletal: No acute osseous abnormality. Lucent circumscribed lesions within the L3, L4 and L5 vertebral bodies. IMPRESSION: 1. Mild inflammatory changes surrounding the pancreas and duodenum suspicious for pancreatitis/duodenitis. No organized fluid collection 2. Hepatic steatosis with findings suggestive of cirrhosis 3. Lucent circumscribed lesions in the L3, L4 and L5 vertebral bodies suggestive of benign process such as hemangioma but not definitive. Consider correlation with nonemergent MRI. Electronically Signed   By: KDonavan FoilM.D.   On: 02/14/2022 20:00     Subjective: Patient seen examined at bedside, resting comfortably.  Just returned from EGD.  No complaints, tolerating diet.  Ready for discharge home.  GI recommends PPI twice daily x6 weeks followed by once daily and outpatient follow-up in their clinic.  No other questions or concerns at this time.  Denies headache, no dizziness, no chest pain, no palpitations, no shortness of breath, no abdominal pain, no focal weakness, no fatigue, no fever/chills/night sweats, no nausea/vomiting/diarrhea, no cough/congestion, no paresthesias.  No acute events overnight per nursing staff.  Discharge Exam: Vitals:   02/18/22 0806 02/18/22 0824  BP: 114/84 117/85  Pulse: 78 79  Resp: 16 18  Temp:  (!) 97.4 F (36.3 C)  SpO2: 100% 100%   Vitals:   02/18/22 0751 02/18/22 0800 02/18/22 0806 02/18/22 0824  BP: 93/66 111/85 114/84 117/85  Pulse: (!) 110 89 78  79  Resp: '18 17 16 18  '$ Temp: 97.8 F (36.6 C)   (!) 97.4 F (36.3 C)  TempSrc: Temporal   Oral  SpO2: 100% 100% 100% 100%  Weight:      Height:        Physical Exam: GEN: NAD, alert and oriented x 3, chronically ill appearance, appears older than stated age HEENT: NCAT, PERRL, EOMI, sclera  clear, MMM PULM: CTAB w/o wheezes/crackles, normal respiratory effort, on room air CV: RRR w/o M/G/R GI: abd soft, NTND, NABS, no R/G/M MSK: no peripheral edema, muscle strength globally intact 5/5 bilateral upper/lower extremities NEURO: CN II-XII intact, no focal deficits, sensation to light touch intact PSYCH: normal mood/affect Integumentary: dry/intact, no rashes or wounds    The results of significant diagnostics from this hospitalization (including imaging, microbiology, ancillary and laboratory) are listed below for reference.     Microbiology: No results found for this or any previous visit (from the past 240 hour(s)).   Labs: BNP (last 3 results) No results for input(s): "BNP" in the last 8760 hours. Basic Metabolic Panel: Recent Labs  Lab 02/14/22 1540 02/15/22 1651 02/16/22 0051 02/16/22 1610 02/17/22 0622 02/17/22 1707 02/18/22 0829  NA 133* 134* 133*  --  134*  --  138  K 3.0* 3.4* 2.9* 3.9 3.4* 3.6 4.2  CL 87* 92* 95*  --  95*  --  100  CO2 26 32 30  --  29  --  29  GLUCOSE 129* 91 127*  --  92  --  118*  BUN 7 5* <5*  --  <5*  --  <5*  CREATININE 0.78 0.71 0.67  --  0.51*  --  0.86  CALCIUM 10.0 8.4* 8.3*  --  8.1*  --  9.0  MG  --  1.4*  --  1.2* 1.7 1.9 1.8   Liver Function Tests: Recent Labs  Lab 02/14/22 1540 02/15/22 1651 02/16/22 0051 02/17/22 0622 02/18/22 0829  AST 121* 114* 117* 176* 165*  ALT '30 31 30 '$ 46* 58*  ALKPHOS 75 63 58 69 67  BILITOT 2.3* 2.4* 1.7*  2.0* 1.5* 1.5*  PROT 9.3* 7.3 6.6 7.5 8.2*  ALBUMIN 4.3 3.1* 2.8* 3.1* 3.4*   Recent Labs  Lab 02/14/22 1540 02/17/22 0622  LIPASE 278* 102*   No results for input(s): "AMMONIA" in the last 168 hours. CBC: Recent Labs  Lab 02/14/22 1540 02/15/22 1651 02/16/22 0051 02/17/22 0622 02/18/22 0829  WBC 6.4 4.7 4.3 3.4* 4.6  NEUTROABS  --  2.6  --   --   --   HGB 14.9 13.3 12.1* 14.3 14.6  HCT 42.6 39.0 35.4* 42.3 42.7  MCV 103.4* 106.6* 105.7* 106.0* 106.0*  PLT 101*  92* 87* 117* 135*   Cardiac Enzymes: No results for input(s): "CKTOTAL", "CKMB", "CKMBINDEX", "TROPONINI" in the last 168 hours. BNP: Invalid input(s): "POCBNP" CBG: No results for input(s): "GLUCAP" in the last 168 hours. D-Dimer No results for input(s): "DDIMER" in the last 72 hours. Hgb A1c No results for input(s): "HGBA1C" in the last 72 hours. Lipid Profile Recent Labs    02/16/22 0051  TRIG 53   Thyroid function studies No results for input(s): "TSH", "T4TOTAL", "T3FREE", "THYROIDAB" in the last 72 hours.  Invalid input(s): "FREET3" Anemia work up No results for input(s): "VITAMINB12", "FOLATE", "FERRITIN", "TIBC", "IRON", "RETICCTPCT" in the last 72 hours. Urinalysis    Component Value Date/Time   COLORURINE YELLOW 02/14/2022 [place] 02/14/2022 1540  APPEARANCEUR Clear 11/05/2019 1045   LABSPEC 1.029 02/14/2022 1540   PHURINE 6.5 02/14/2022 1540   GLUCOSEU NEGATIVE 02/14/2022 1540   HGBUR TRACE (A) 02/14/2022 1540   HGBUR negative 09/05/2009 0900   BILIRUBINUR MODERATE (A) 02/14/2022 1540   BILIRUBINUR Negative 11/05/2019 1045   KETONESUR >80 (A) 02/14/2022 1540   PROTEINUR 30 (A) 02/14/2022 1540   UROBILINOGEN 0.2 10/11/2013 1416   NITRITE NEGATIVE 02/14/2022 1540   LEUKOCYTESUR NEGATIVE 02/14/2022 1540   Sepsis Labs Recent Labs  Lab 02/15/22 1651 02/16/22 0051 02/17/22 0622 02/18/22 0829  WBC 4.7 4.3 3.4* 4.6   Microbiology No results found for this or any previous visit (from the past 240 hour(s)).   Time coordinating discharge: Over 30 minutes  SIGNED:   Elika Godar J [place] DO  [facility] 02/18/2022, 10:47 AM

## 2022-02-18 NOTE — Op Note (Signed)
[facility] Patient Name: [name] [name] Procedure Date: 02/18/2022 MRN: [id number] Attending MD: Gerrit Heck , MD Date of Birth: 26-Jul-1970 CSN: [id number] Age: 51 Admit Type: Inpatient Procedure:                Upper GI endoscopy Indications:              Coffee-ground emesis, Abnormal CT of the GI tract                           Questionable cirrhotic appearing liver on CT. EGD                            to evaluate for esophageal varcies, portal                            hypertensive gastropathy. CT also with duodenitis                            in the setting of pancreatitis. Providers:                Gerrit Heck, MD, Dulcy Fanny, Frazier Richards, Technician Referring MD:              Medicines:                Monitored Anesthesia Care Complications:            No immediate complications. Estimated Blood Loss:     Estimated blood loss was minimal. Procedure:                Pre-Anesthesia Assessment:                           - Prior to the procedure, a History and Physical                            was performed, and patient medications and                            allergies were reviewed. The patient's tolerance of                            previous anesthesia was also reviewed. The risks                            and benefits of the procedure and the sedation                            options and risks were discussed with the patient.                            All questions were answered, and informed consent                            was  obtained. Prior Anticoagulants: The patient has                            taken no previous anticoagulant or antiplatelet                            agents. ASA Grade Assessment: III - A patient with                            severe systemic disease. After reviewing the risks                            and benefits, the patient was deemed in                             satisfactory condition to undergo the procedure.                           After obtaining informed consent, the endoscope was                            passed under direct vision. Throughout the                            procedure, the patient's blood pressure, pulse, and                            oxygen saturations were monitored continuously. The                            [id number]-[id number] ([id number]) Olympus endoscope was introduced                            through the mouth, and advanced to the second part                            of duodenum. The upper GI endoscopy was                            accomplished without difficulty. The patient                            tolerated the procedure well. Scope In: Scope Out: Findings:      The examined esophagus was normal.      A 2 cm hiatal hernia was present.      Diffuse mild inflammation characterized by congestion (edema) and       erythema was found in the gastric fundus, in the gastric body and in the       gastric antrum. No ulcers or erosions. Biopsies were taken with a cold       forceps for Helicobacter pylori testing and histology. Estimated blood       loss was minimal.      The examined duodenum was normal. Impression:               -  Normal esophagus.                           - 2 cm hiatal hernia.                           - Gastritis. Biopsied.                           - Normal examined duodenum.                           - No esophageal or gastric varices noted on this                            study. Moderate Sedation:      Not Applicable - Patient had care per Anesthesia. Recommendation:           - Return patient to hospital ward for possible                            discharge same day.                           - Advance diet as tolerated.                           - Use Prilosec (omeprazole) 20 mg PO BID for 6                            weeks to promote healing of gastritis, then reduce                             to 20 mg daily and can stop completely if no upper                            GI symptoms.                           - Await pathology results.                           - Return to GI clinic at appointment to be                            scheduled. Will arrange for follow-up appointment.                           - MRCP as outpatient.                           - Inpatient GI service will sign off at this time.                            Please do not hesitate to contact us with  additional questions or concerns. Procedure Code(s):        --- Professional ---                           [phone], Esophagogastroduodenoscopy, flexible,                            transoral; with biopsy, single or multiple Diagnosis Code(s):        --- Professional ---                           K44.9, Diaphragmatic hernia without obstruction or                            gangrene                           K29.70, Gastritis, unspecified, without bleeding                           K92.0, Hematemesis                           R93.3, Abnormal findings on diagnostic imaging of                            other parts of digestive tract CPT copyright 2019 American Medical Association. All rights reserved. The codes documented in this report are preliminary and upon coder review may  be revised to meet current compliance requirements. Gerrit Heck, MD 02/18/2022 7:54:45 AM Number of Addenda: 0

## 2022-02-18 NOTE — Interval H&P Note (Signed)
History and Physical Interval Note:  02/18/2022 7:26 AM  [name] [name]  has presented today for surgery, with the diagnosis of Thrombocytopenia, abnormal imaging, coffee-ground emesis, rule out varices.  The various methods of treatment have been discussed with the patient and family. After consideration of risks, benefits and other options for treatment, the patient has consented to  Procedure(s): ESOPHAGOGASTRODUODENOSCOPY (EGD) (N/A) as a surgical intervention.  The patient's history has been reviewed, patient examined, no change in status, stable for surgery.  I have reviewed the patient's chart and labs.  Questions were answered to the patient's satisfaction.     [name] [name]

## 2022-02-18 NOTE — Progress Notes (Signed)
  Transition of Care [facility]) Screening Note   Patient Details  Name: [name] [name] Date of Birth: Dec 12, 1970   Transition of Care [facility] CM/SW Contact:    Roseanne Kaufman, RN Phone Number: 02/18/2022, 11:07 AM   SA resources have been attached to AVS Transition of Care Department [facility]) has reviewed patient and no TOC needs have been identified at this time. We will continue to monitor patient advancement through interdisciplinary progression rounds. If new patient transition needs arise, please place a TOC consult.

## 2022-02-18 NOTE — Progress Notes (Signed)
Pt has gone to Endo for EGD.

## 2022-02-18 NOTE — Anesthesia Postprocedure Evaluation (Signed)
Anesthesia Post Note  Patient: [name] [name]  Procedure(s) Performed: ESOPHAGOGASTRODUODENOSCOPY (EGD) BIOPSY     Patient location during evaluation: Endoscopy Anesthesia Type: MAC Level of consciousness: oriented, awake and alert and awake Pain management: pain level controlled Vital Signs Assessment: post-procedure vital signs reviewed and stable Respiratory status: spontaneous breathing, nonlabored ventilation, respiratory function stable and patient connected to nasal cannula oxygen Cardiovascular status: blood pressure returned to baseline and stable Postop Assessment: no headache, no backache and no apparent nausea or vomiting Anesthetic complications: no   No notable events documented.  Last Vitals:  Vitals:   02/18/22 0806 02/18/22 0824  BP: 114/84 117/85  Pulse: 78 79  Resp: 16 18  Temp:  (!) 36.3 C  SpO2: 100% 100%    Last Pain:  Vitals:   02/18/22 0824  TempSrc: Oral  PainSc:                  [place]

## 2022-02-18 NOTE — Anesthesia Preprocedure Evaluation (Signed)
Anesthesia Evaluation  Patient identified by MRN, date of birth, ID band Patient awake    Reviewed: Allergy & Precautions, NPO status , Patient's Chart, lab work & pertinent test results, reviewed documented beta blocker date and time   Airway Mallampati: II  TM Distance: >3 FB Neck ROM: Full    Dental  (+) Teeth Intact, Dental Advisory Given   Pulmonary neg pulmonary ROS,    Pulmonary exam normal breath sounds clear to auscultation       Cardiovascular hypertension, Pt. on home beta blockers and Pt. on medications +CHF  Normal cardiovascular exam Rhythm:Regular Rate:Normal     Neuro/Psych PSYCHIATRIC DISORDERS Depression negative neurological ROS     GI/Hepatic Neg liver ROS, coffee-ground emesis   Endo/Other  negative endocrine ROS  Renal/GU negative Renal ROS     Musculoskeletal negative musculoskeletal ROS (+)   Abdominal   Peds  Hematology  (+) Blood dyscrasia (Thrombocytopenia), ,   Anesthesia Other Findings Day of surgery medications reviewed with the patient.  Reproductive/Obstetrics                             Anesthesia Physical Anesthesia Plan  ASA: 3  Anesthesia Plan: MAC   Post-op Pain Management: Minimal or no pain anticipated   Induction: Intravenous  PONV Risk Score and Plan: 1 and TIVA and Treatment may vary due to age or medical condition  Airway Management Planned: Natural Airway and Nasal Cannula  Additional Equipment:   Intra-op Plan:   Post-operative Plan:   Informed Consent: I have reviewed the patients History and Physical, chart, labs and discussed the procedure including the risks, benefits and alternatives for the proposed anesthesia with the patient or authorized representative who has indicated his/her understanding and acceptance.     Dental advisory given  Plan Discussed with: CRNA  Anesthesia Plan Comments:         Anesthesia  Quick Evaluation

## 2022-02-18 NOTE — Plan of Care (Signed)

## 2022-02-19 ENCOUNTER — Telehealth: Payer: Self-pay

## 2022-02-19 ENCOUNTER — Other Ambulatory Visit (HOSPITAL_COMMUNITY): Payer: Self-pay

## 2022-02-19 NOTE — Telephone Encounter (Signed)
Transition Care Management Follow-up Telephone Call Date of discharge and from where: 02/18/2022, [facility] How have you been since you were released from the hospital? He stated he is feeling better.  Any questions or concerns? No  Items Reviewed: Did the pt receive and understand the discharge instructions provided? Yes  Medications obtained and verified?  He said he has some of his medications but not the new ones. He plans to pick those up tomorrow.  Other? No  Any new allergies since your discharge? No  Dietary orders reviewed? Yes; but he said he is just starting to tolerate food again  Do you have support at home?  He said he lives alone and does not need any help.   Home Care and Equipment/Supplies: Were home health services ordered? no If so, what is the name of the agency? N/a  Has the agency set up a time to come to the patient's home? not applicable Were any new equipment or medical supplies ordered?  No What is the name of the medical supply agency? N/a Were you able to get the supplies/equipment? not applicable Do you have any questions related to the use of the equipment or supplies? No  Functional Questionnaire: (I = Independent and D = Dependent) ADLs: independent  Follow up appointments reviewed:  PCP Hospital f/u appt confirmed? Yes  Scheduled to see Dr Redmond Pulling - 02/20/2022.   [facility] f/u appt confirmed?  None scheduled at this time    Are transportation arrangements needed? No  If their condition worsens, is the pt aware to call PCP or go to the Emergency Dept.? Yes Was the patient provided with contact information for the PCP's office or ED? Yes Was to pt encouraged to call back with questions or concerns? Yes

## 2022-02-20 ENCOUNTER — Encounter: Payer: Self-pay | Admitting: Family Medicine

## 2022-02-20 ENCOUNTER — Encounter: Payer: Medicaid Other | Admitting: Family Medicine

## 2022-02-20 LAB — HEPATITIS PANEL, ACUTE: Hep A IgM: NEGATIVE — AB

## 2022-02-20 LAB — SURGICAL PATHOLOGY

## 2022-02-20 NOTE — Progress Notes (Signed)
Patient rescheduled HFU as he got out of hospital on yesterday

## 2022-02-21 ENCOUNTER — Encounter (HOSPITAL_COMMUNITY): Payer: Self-pay | Admitting: Gastroenterology

## 2022-03-06 ENCOUNTER — Ambulatory Visit (INDEPENDENT_AMBULATORY_CARE_PROVIDER_SITE_OTHER): Payer: Medicaid Other | Admitting: Family Medicine

## 2022-03-06 ENCOUNTER — Encounter: Payer: Self-pay | Admitting: Family Medicine

## 2022-03-06 ENCOUNTER — Other Ambulatory Visit (HOSPITAL_COMMUNITY): Payer: Self-pay

## 2022-03-06 VITALS — BP 120/82 | HR 94 | Temp 98.1°F | Resp 16 | Ht 77.0 in | Wt 198.6 lb

## 2022-03-06 DIAGNOSIS — Z1211 Encounter for screening for malignant neoplasm of colon: Secondary | ICD-10-CM

## 2022-03-06 DIAGNOSIS — Z0000 Encounter for general adult medical examination without abnormal findings: Secondary | ICD-10-CM | POA: Diagnosis not present

## 2022-03-06 DIAGNOSIS — Z1329 Encounter for screening for other suspected endocrine disorder: Secondary | ICD-10-CM | POA: Diagnosis not present

## 2022-03-06 DIAGNOSIS — Z13228 Encounter for screening for other metabolic disorders: Secondary | ICD-10-CM

## 2022-03-06 DIAGNOSIS — Z13220 Encounter for screening for lipoid disorders: Secondary | ICD-10-CM | POA: Diagnosis not present

## 2022-03-06 DIAGNOSIS — Z23 Encounter for immunization: Secondary | ICD-10-CM

## 2022-03-06 DIAGNOSIS — Z130 Encounter for screening for diseases of the blood and blood-forming organs and certain disorders involving the immune mechanism: Secondary | ICD-10-CM | POA: Diagnosis not present

## 2022-03-06 DIAGNOSIS — Z125 Encounter for screening for malignant neoplasm of prostate: Secondary | ICD-10-CM

## 2022-03-06 MED ORDER — TAMSULOSIN HCL 0.4 MG PO CAPS
0.4000 mg | ORAL_CAPSULE | Freq: Every day | ORAL | 2 refills | Status: DC
  Filled 2022-03-06: qty 30, 30d supply, fill #0

## 2022-03-06 MED ORDER — OMEPRAZOLE 20 MG PO CPDR
DELAYED_RELEASE_CAPSULE | ORAL | 0 refills | Status: DC
  Filled 2022-03-06 (×2): qty 174, 132d supply, fill #0

## 2022-03-06 MED ORDER — ENTRESTO 97-103 MG PO TABS
1.0000 | ORAL_TABLET | Freq: Two times a day (BID) | ORAL | 1 refills | Status: DC
  Filled 2022-03-06: qty 180, 90d supply, fill #0

## 2022-03-06 MED ORDER — SPIRONOLACTONE 25 MG PO TABS
12.5000 mg | ORAL_TABLET | Freq: Every day | ORAL | 2 refills | Status: DC
  Filled 2022-03-06: qty 15, 30d supply, fill #0

## 2022-03-06 MED ORDER — CARVEDILOL 12.5 MG PO TABS
12.5000 mg | ORAL_TABLET | Freq: Two times a day (BID) | ORAL | 2 refills | Status: DC
  Filled 2022-03-06: qty 60, 30d supply, fill #0

## 2022-03-06 NOTE — Progress Notes (Unsigned)
L ph

## 2022-03-07 ENCOUNTER — Other Ambulatory Visit (HOSPITAL_COMMUNITY): Payer: Self-pay

## 2022-03-07 ENCOUNTER — Other Ambulatory Visit: Payer: Self-pay | Admitting: Family Medicine

## 2022-03-07 LAB — CBC WITH DIFFERENTIAL/PLATELET
Basophils Absolute: 0.1 10*3/uL (ref 0.0–0.2)
Basos: 1 %
EOS (ABSOLUTE): 0.1 10*3/uL (ref 0.0–0.4)
Eos: 3 %
Hematocrit: 40.6 % (ref 37.5–51.0)
Hemoglobin: 14.4 g/dL (ref 13.0–17.7)
Immature Grans (Abs): 0 10*3/uL (ref 0.0–0.1)
Immature Granulocytes: 0 %
Lymphocytes Absolute: 1.7 10*3/uL (ref 0.7–3.1)
Lymphs: 35 %
MCH: 36.3 pg — ABNORMAL HIGH (ref 26.6–33.0)
MCHC: 35.5 g/dL (ref 31.5–35.7)
MCV: 102 fL — ABNORMAL HIGH (ref 79–97)
Monocytes Absolute: 0.4 10*3/uL (ref 0.1–0.9)
Monocytes: 9 %
Neutrophils Absolute: 2.6 10*3/uL (ref 1.4–7.0)
Neutrophils: 52 %
Platelets: 221 10*3/uL (ref 150–450)
RBC: 3.97 x10E6/uL — ABNORMAL LOW (ref 4.14–5.80)
RDW: 12 % (ref 11.6–15.4)
WBC: 4.9 10*3/uL (ref 3.4–10.8)

## 2022-03-07 LAB — CMP14+EGFR
ALT: 24 IU/L (ref 0–44)
AST: 98 IU/L — ABNORMAL HIGH (ref 0–40)
Albumin/Globulin Ratio: 0.8 — ABNORMAL LOW (ref 1.2–2.2)
Albumin: 3.7 g/dL — ABNORMAL LOW (ref 3.8–4.9)
Alkaline Phosphatase: 99 IU/L (ref 44–121)
BUN/Creatinine Ratio: 8 — ABNORMAL LOW (ref 9–20)
BUN: 5 mg/dL — ABNORMAL LOW (ref 6–24)
Bilirubin Total: 0.7 mg/dL (ref 0.0–1.2)
CO2: 23 mmol/L (ref 20–29)
Calcium: 8.8 mg/dL (ref 8.7–10.2)
Chloride: 100 mmol/L (ref 96–106)
Creatinine, Ser: 0.64 mg/dL — ABNORMAL LOW (ref 0.76–1.27)
Globulin, Total: 4.5 g/dL (ref 1.5–4.5)
Glucose: 78 mg/dL (ref 70–99)
Potassium: 4.6 mmol/L (ref 3.5–5.2)
Sodium: 140 mmol/L (ref 134–144)
Total Protein: 8.2 g/dL (ref 6.0–8.5)
eGFR: 113 mL/min/{1.73_m2} (ref >59)

## 2022-03-07 LAB — LIPID PANEL
Chol/HDL Ratio: 3.1 ratio (ref 0.0–5.0)
Cholesterol, Total: 187 mg/dL (ref 100–199)
HDL: 61 mg/dL (ref >39)
LDL Chol Calc (NIH): 108 mg/dL — ABNORMAL HIGH (ref 0–99)
Triglycerides: 99 mg/dL (ref 0–149)
VLDL Cholesterol Cal: 18 mg/dL (ref 5–40)

## 2022-03-07 LAB — TSH: TSH: 1.42 u[IU]/mL (ref 0.450–4.500)

## 2022-03-07 LAB — PSA, TOTAL AND FREE
PSA, Free Pct: 22.9 %
PSA, Free: 0.16 ng/mL
Prostate Specific Ag, Serum: 0.7 ng/mL (ref 0.0–4.0)

## 2022-03-07 LAB — VITAMIN D 25 HYDROXY (VIT D DEFICIENCY, FRACTURES): Vit D, 25-Hydroxy: 10.9 ng/mL — ABNORMAL LOW (ref 30.0–100.0)

## 2022-03-07 LAB — HEMOGLOBIN A1C
Est. average glucose Bld gHb Est-mCnc: 105 mg/dL
Hgb A1c MFr Bld: 5.3 % (ref 4.8–5.6)

## 2022-03-07 MED ORDER — VITAMIN D (ERGOCALCIFEROL) 1.25 MG (50000 UNIT) PO CAPS
50000.0000 [IU] | ORAL_CAPSULE | ORAL | 0 refills | Status: AC
  Filled 2022-03-07: qty 12, 84d supply, fill #0

## 2022-03-07 NOTE — Progress Notes (Signed)
Established Patient Office Visit  Subjective    Patient ID: [name] [name], male    DOB: [birth year]  Age: 51 y.o. MRN: [id number]  CC:  Chief Complaint  Patient presents with   Annual Exam    HPI [name] presents for routine annual exam. Patient denies acute complaints or concerns.    Outpatient Encounter Medications as of 03/06/2022  Medication Sig   folic acid (FOLVITE) 1 MG tablet Take 1 tablet (1 mg total) by mouth daily.   Multiple Vitamin (MULTIVITAMIN WITH MINERALS) TABS tablet Take 1 tablet by mouth daily.   thiamine (VITAMIN B-1) 100 MG tablet Take 1 tablet (100 mg total) by mouth daily.   [DISCONTINUED] carvedilol (COREG) 12.5 MG tablet Take 1 tablet (12.5 mg total) by mouth 2 (two) times daily.   [DISCONTINUED] omeprazole (PRILOSEC) 20 MG capsule Take 1 capsule (20 mg total) by mouth 2 (two) times daily for 42 days, THEN 1 capsule (20 mg total) daily.   [DISCONTINUED] sacubitril-valsartan (ENTRESTO) 97-103 MG Take 1 tablet by mouth 2 (two) times daily.   [DISCONTINUED] spironolactone (ALDACTONE) 25 MG tablet Take 0.5 tablets (12.5 mg total) by mouth daily.   [DISCONTINUED] tamsulosin (FLOMAX) 0.4 MG CAPS capsule Take 1 capsule (0.4 mg total) by mouth daily.   carvedilol (COREG) 12.5 MG tablet Take 1 tablet (12.5 mg total) by mouth 2 (two) times daily.   omeprazole (PRILOSEC) 20 MG capsule Take 1 capsule (20 mg total) by mouth once daily.   sacubitril-valsartan (ENTRESTO) 97-103 MG Take 1 tablet by mouth 2 (two) times daily.   spironolactone (ALDACTONE) 25 MG tablet Take 0.5 tablets (12.5 mg total) by mouth daily.   tamsulosin (FLOMAX) 0.4 MG CAPS capsule Take 1 capsule (0.4 mg total) by mouth daily.   No facility-administered encounter medications on file as of 03/06/2022.    Past Medical History:  Diagnosis Date   COLONIC POLYPS, BENIGN 01/20/2010   Annotation: 12/2009: path benign; rpt 2016 Qualifier: Diagnosis of  By: Jorene Minors, Scott     Hypertension      Past Surgical History:  Procedure Laterality Date   BIOPSY  02/18/2022   Procedure: BIOPSY;  Surgeon: Lavena Bullion, DO;  Location: [facility];  Service: Gastroenterology;;   ESOPHAGOGASTRODUODENOSCOPY N/A 02/18/2022   Procedure: ESOPHAGOGASTRODUODENOSCOPY (EGD);  Surgeon: Lavena Bullion, DO;  Location: [facility];  Service: Gastroenterology;  Laterality: N/A;   POLYPECTOMY     RIGHT/LEFT HEART CATH AND CORONARY ANGIOGRAPHY N/A 08/27/2016   Procedure: Right/Left Heart Cath and Coronary Angiography;  Surgeon: Sherren Mocha, MD;  Location: [facility];  Service: Cardiovascular;  Laterality: N/A;    Family History  Problem Relation Age of Onset   Diabetes Other    Hypertension Other    Colon cancer Neg Hx    Esophageal cancer Neg Hx    Inflammatory bowel disease Neg Hx    Liver disease Neg Hx    Pancreatic cancer Neg Hx    Gastric cancer Neg Hx    Rectal cancer Neg Hx     Social History   Socioeconomic History   Marital status: Single    Spouse name: Not on file   Number of children: Not on file   Years of education: Not on file   Highest education level: Not on file  Occupational History   Not on file  Tobacco Use   Smoking status: Never   Smokeless tobacco: Never  Substance and Sexual Activity   Alcohol use: Yes  Comment: 3-4 Beers Daily   Drug use: Yes    Types: Marijuana    Comment: pt reports marijuana use occasionally   Sexual activity: Yes    Birth control/protection: None  Other Topics Concern   Not on file  Social History Narrative   Not on file   Social Determinants of Health   Financial Resource Strain: Not on file  Food Insecurity: No Food Insecurity (02/15/2022)   Hunger Vital Sign    Worried About Running Out of Food in the Last Year: Never true    Ran Out of Food in the Last Year: Never true  Transportation Needs: No Transportation Needs (02/15/2022)   PRAPARE - Hydrologist (Medical): No     Lack of Transportation (Non-Medical): No  Physical Activity: Not on file  Stress: Not on file  Social Connections: Not on file  Intimate Partner Violence: Not At Risk (02/15/2022)   Humiliation, Afraid, Rape, and Kick questionnaire    Fear of Current or Ex-Partner: No    Emotionally Abused: No    Physically Abused: No    Sexually Abused: No    Review of Systems  All other systems reviewed and are negative.       Objective    BP 120/82   Pulse 94   Temp 98.1 F (36.7 C) (Oral)   Resp 16   Ht 6' 5"  (1.956 m)   Wt 198 lb 9.6 oz (90.1 kg)   SpO2 95%   BMI 23.55 kg/m   Physical Exam Vitals and nursing note reviewed.  Constitutional:      General: He is not in acute distress. HENT:     Head: Normocephalic and atraumatic.     Right Ear: Tympanic membrane, ear canal and external ear normal.     Left Ear: Tympanic membrane, ear canal and external ear normal.     Nose: Nose normal.     Mouth/Throat:     Mouth: Mucous membranes are moist.     Pharynx: Oropharynx is clear.  Eyes:     Conjunctiva/sclera: Conjunctivae normal.     Pupils: Pupils are equal, round, and reactive to light.  Neck:     Thyroid: No thyromegaly.  Cardiovascular:     Rate and Rhythm: Normal rate and regular rhythm.     Heart sounds: Normal heart sounds. No murmur heard. Pulmonary:     Effort: Pulmonary effort is normal.     Breath sounds: Normal breath sounds.  Abdominal:     General: There is no distension.     Palpations: Abdomen is soft. There is no mass.     Tenderness: There is no abdominal tenderness.     Hernia: There is no hernia in the left inguinal area or right inguinal area.  Genitourinary:    Penis: Normal.      Testes: Normal.  Musculoskeletal:        General: Normal range of motion.     Cervical back: Normal range of motion and neck supple.     Right lower leg: No edema.     Left lower leg: No edema.  Skin:    General: Skin is warm and dry.  Neurological:     General: No  focal deficit present.     Mental Status: He is alert and oriented to person, place, and time. Mental status is at baseline.  Psychiatric:        Mood and Affect: Mood normal.  Behavior: Behavior normal.         Assessment & Plan:   1. Annual physical exam  - CMP14+EGFR  2. Screening for deficiency anemia  - CBC with Differential  3. Screening for lipid disorders  - Lipid Panel  4. Screening for endocrine/metabolic/immunity disorders  - TSH - Vitamin D, 25-hydroxy - Hemoglobin A1c  5. Screening for colon cancer  - Ambulatory referral to Gastroenterology  6. Screening for prostate cancer  - PSA, total and free  7. Need for shingles vaccine  - Varicella-zoster vaccine IM    Return in about 6 months (around 09/05/2022) for follow up.   Becky Sax, MD

## 2022-03-15 ENCOUNTER — Other Ambulatory Visit (HOSPITAL_COMMUNITY): Payer: Self-pay

## 2022-04-18 ENCOUNTER — Other Ambulatory Visit (HOSPITAL_COMMUNITY): Payer: Self-pay

## 2022-04-18 ENCOUNTER — Ambulatory Visit (INDEPENDENT_AMBULATORY_CARE_PROVIDER_SITE_OTHER): Payer: Medicaid Other

## 2022-04-18 DIAGNOSIS — Z23 Encounter for immunization: Secondary | ICD-10-CM

## 2022-04-18 MED ORDER — CARVEDILOL 12.5 MG PO TABS
12.5000 mg | ORAL_TABLET | Freq: Two times a day (BID) | ORAL | 2 refills | Status: DC
  Filled 2022-04-18: qty 60, 30d supply, fill #0

## 2022-04-18 MED ORDER — OMEPRAZOLE 20 MG PO CPDR
DELAYED_RELEASE_CAPSULE | ORAL | 0 refills | Status: DC
  Filled 2022-04-18: qty 90, 48d supply, fill #0

## 2022-04-18 MED ORDER — TAMSULOSIN HCL 0.4 MG PO CAPS
0.4000 mg | ORAL_CAPSULE | Freq: Every day | ORAL | 2 refills | Status: DC
  Filled 2022-04-18: qty 30, 30d supply, fill #0
  Filled 2022-05-28: qty 30, 30d supply, fill #1
  Filled 2022-07-13: qty 30, 30d supply, fill #2

## 2022-04-18 MED ORDER — SPIRONOLACTONE 25 MG PO TABS
12.5000 mg | ORAL_TABLET | Freq: Every day | ORAL | 2 refills | Status: DC
  Filled 2022-04-18: qty 15, 30d supply, fill #0
  Filled 2022-05-28: qty 15, 30d supply, fill #1
  Filled 2022-09-25: qty 15, 30d supply, fill #2

## 2022-04-18 NOTE — Addendum Note (Signed)
Addended by: Dalbert Mayotte E on: 04/18/2022 12:22 PM   Modules accepted: Orders

## 2022-04-18 NOTE — Progress Notes (Signed)
Patent came in  for shringx 2 Patient tolerated it well .

## 2022-05-01 ENCOUNTER — Other Ambulatory Visit (INDEPENDENT_AMBULATORY_CARE_PROVIDER_SITE_OTHER): Payer: Medicaid Other

## 2022-05-01 ENCOUNTER — Ambulatory Visit (INDEPENDENT_AMBULATORY_CARE_PROVIDER_SITE_OTHER): Payer: Medicaid Other | Admitting: Gastroenterology

## 2022-05-01 ENCOUNTER — Encounter: Payer: Self-pay | Admitting: Gastroenterology

## 2022-05-01 VITALS — BP 118/80 | HR 107 | Ht 77.0 in | Wt 198.4 lb

## 2022-05-01 DIAGNOSIS — F1090 Alcohol use, unspecified, uncomplicated: Secondary | ICD-10-CM

## 2022-05-01 DIAGNOSIS — D7589 Other specified diseases of blood and blood-forming organs: Secondary | ICD-10-CM

## 2022-05-01 DIAGNOSIS — K852 Alcohol induced acute pancreatitis without necrosis or infection: Secondary | ICD-10-CM | POA: Diagnosis not present

## 2022-05-01 DIAGNOSIS — Z86010 Personal history of colonic polyps: Secondary | ICD-10-CM

## 2022-05-01 DIAGNOSIS — Z8719 Personal history of other diseases of the digestive system: Secondary | ICD-10-CM

## 2022-05-01 DIAGNOSIS — Z800 Family history of malignant neoplasm of digestive organs: Secondary | ICD-10-CM

## 2022-05-01 DIAGNOSIS — R7989 Other specified abnormal findings of blood chemistry: Secondary | ICD-10-CM

## 2022-05-01 LAB — COMPREHENSIVE METABOLIC PANEL
ALT: 22 U/L (ref 0–53)
AST: 110 U/L — ABNORMAL HIGH (ref 0–37)
Albumin: 3.8 g/dL (ref 3.5–5.2)
Alkaline Phosphatase: 100 U/L (ref 39–117)
BUN: 5 mg/dL — ABNORMAL LOW (ref 6–23)
CO2: 25 mEq/L (ref 19–32)
Calcium: 9.1 mg/dL (ref 8.4–10.5)
Chloride: 97 mEq/L (ref 96–112)
Creatinine, Ser: 0.63 mg/dL (ref 0.40–1.50)
GFR: 108.81 mL/min (ref >60.00)
Glucose, Bld: 103 mg/dL — ABNORMAL HIGH (ref 70–99)
Potassium: 3.4 mEq/L — ABNORMAL LOW (ref 3.5–5.1)
Sodium: 137 mEq/L (ref 135–145)
Total Bilirubin: 1.1 mg/dL (ref 0.2–1.2)
Total Protein: 9.3 g/dL — ABNORMAL HIGH (ref 6.0–8.3)

## 2022-05-01 LAB — FOLATE: Folate: 7.2 ng/mL (ref >5.9)

## 2022-05-01 LAB — CK: Total CK: 73 U/L (ref 7–232)

## 2022-05-01 LAB — VITAMIN B12: Vitamin B-12: 579 pg/mL (ref 211–911)

## 2022-05-01 NOTE — Patient Instructions (Addendum)
Your provider has requested that you go to the basement level for lab work before leaving today. Press "B" on the elevator. The lab is located at the first door on the left as you exit the elevator.  You have been scheduled for a colonoscopy. Please follow written instructions given to you at your visit today.  Please pick up your prep supplies at the pharmacy within the next 1-3 days. If you use inhalers (even only as needed), please bring them with you on the day of your procedure.  You have been given a sample of prep- Plenvu.   Due to recent changes in healthcare laws, you may see the results of your imaging and laboratory studies on MyChart before your provider has had a chance to review them.  We understand that in some cases there may be results that are confusing or concerning to you. Not all laboratory results come back in the same time frame and the provider may be waiting for multiple results in order to interpret others.  Please give [place] 48 hours in order for your provider to thoroughly review all the results before contacting the office for clarification of your results.   _______________________________________________________  If you are age 4 or older, your body mass index should be between 23-30. Your Body mass index is 23.52 kg/m. If this is out of the aforementioned range listed, please consider follow up with your Primary Care Provider.  If you are age 76 or younger, your body mass index should be between 19-25. Your Body mass index is 23.52 kg/m. If this is out of the aformentioned range listed, please consider follow up with your Primary Care Provider.   _____________________________________________________  The [facility] providers would like to encourage you to use [facility] to communicate with providers for non-urgent requests or questions.  Due to long hold times on the telephone, sending your provider a message by [facility] may be a faster and more efficient way to get a  response.  Please allow 48 business hours for a response.  Please remember that this is for non-urgent requests.  _____________________________________________________  Thank you for choosing me and [facility].  Dr. Rush Landmark

## 2022-05-01 NOTE — Progress Notes (Signed)
[facility] VISIT   Primary Care Provider Dorna Mai, [place] [address] [phone]  Referring Provider Dorna Mai, [place] [address] [place],  [place] [address] [phone]  Patient Profile: RACHEL SAMPLES is a 51 y.o. male with a pmh significant for  The patient presents to the [facility] for an evaluation and management of problem(s) noted below:  Problem List No diagnosis found.  History of Present Illness    The patient does/does not take NSAIDs or BC/Goody Powder. Patient has/has not had an EGD. Patient has/has not had a Colonoscopy.  GI Review of Systems Positive as above Negative for  Pyrosis; Reflux; Regurgitation; Dysphagia; Odynophagia; Globus; Post-prandial cough; Nocturnal cough; Nasal regurgitation; Epigastric pain; Nausea; Vomiting; Hematemesis; Jaundice; Change in Appetite; Early satiety; Abdominal pain; Abdominal bloating; Eructation; Flatulence; Change in BM Frequency; Change in BM Consistency; Constipation; Diarrhea; Incontinence; Urgency; Tenesmus; Hematochezia; Melena  Review of Systems General: Denies fevers/chills/weight loss/night sweats HEENT: Denies oral lesions/sore throat/headaches/visual changes Cardiovascular: Denies chest pain/palpitations Pulmonary: Denies shortness of breath/cough Gastroenterological: See HPI Genitourinary: Denies darkened urine or hematuria Hematological: Denies easy bruising/bleeding Endocrine: Denies temperature intolerance Dermatological: Denies skin changes Psychological: Mood is stable Allergy & Immunology: Denies severe allergic reactions Musculoskeletal: Denies new arthralgias   Medications Current Outpatient Medications  Medication Sig Dispense Refill   carvedilol (COREG) 12.5 MG tablet Take 1 tablet (12.5 mg total) by mouth 2 (two) times daily. 60 tablet 2   folic acid (FOLVITE) 1 MG tablet Take 1 tablet  (1 mg total) by mouth daily. 30 tablet 2   Multiple Vitamin (MULTIVITAMIN WITH MINERALS) TABS tablet Take 1 tablet by mouth daily.     omeprazole (PRILOSEC) 20 MG capsule Take 1 capsule (20 mg total) by mouth 2 (two) times daily for 42 days, THEN 1 capsule (20 mg total) daily 90 capsule 0   sacubitril-valsartan (ENTRESTO) 97-103 MG Take 1 tablet by mouth 2 (two) times daily. 180 tablet 1   spironolactone (ALDACTONE) 25 MG tablet Take 1/2 tablet (12.5 mg total) by mouth daily. 15 tablet 2   tamsulosin (FLOMAX) 0.4 MG CAPS capsule Take 1 capsule (0.4 mg total) by mouth daily. 30 capsule 2   thiamine (VITAMIN B-1) 100 MG tablet Take 1 tablet (100 mg total) by mouth daily. 30 tablet 2   Vitamin D, Ergocalciferol, (DRISDOL) 1.25 MG (50000 UNIT) CAPS capsule Take 1 capsule (50,000 Units total) by mouth every 7 (seven) days. 12 capsule 0   No current facility-administered medications for this visit.    Allergies No Known Allergies  Histories Past Medical History:  Diagnosis Date   COLONIC POLYPS, BENIGN 01/20/2010   Annotation: 12/2009: path benign; rpt 2016 Qualifier: Diagnosis of  By: Jorene Minors, Scott     Hypertension    Past Surgical History:  Procedure Laterality Date   BIOPSY  02/18/2022   Procedure: BIOPSY;  Surgeon: Lavena Bullion, DO;  Location: [facility];  Service: Gastroenterology;;   ESOPHAGOGASTRODUODENOSCOPY N/A 02/18/2022   Procedure: ESOPHAGOGASTRODUODENOSCOPY (EGD);  Surgeon: Lavena Bullion, DO;  Location: [facility];  Service: Gastroenterology;  Laterality: N/A;   POLYPECTOMY     RIGHT/LEFT HEART CATH AND CORONARY ANGIOGRAPHY N/A 08/27/2016   Procedure: Right/Left Heart Cath and Coronary Angiography;  Surgeon: Sherren Mocha, MD;  Location: [facility];  Service: Cardiovascular;  Laterality: N/A;   Social History   Socioeconomic History   Marital status: Single    Spouse name: Not on file  Number of children: Not on file   Years of education: Not on  file   Highest education level: Not on file  Occupational History   Not on file  Tobacco Use   Smoking status: Never   Smokeless tobacco: Never  Substance and Sexual Activity   Alcohol use: Yes    Comment: 3-4 Beers Daily   Drug use: Yes    Types: Marijuana    Comment: pt reports marijuana use occasionally   Sexual activity: Yes    Birth control/protection: None  Other Topics Concern   Not on file  Social History Narrative   Not on file   Social Determinants of Health   Financial Resource Strain: Not on file  Food Insecurity: No Food Insecurity (02/15/2022)   Hunger Vital Sign    Worried About Running Out of Food in the Last Year: Never true    Ran Out of Food in the Last Year: Never true  Transportation Needs: No Transportation Needs (02/15/2022)   PRAPARE - Hydrologist (Medical): No    Lack of Transportation (Non-Medical): No  Physical Activity: Not on file  Stress: Not on file  Social Connections: Not on file  Intimate Partner Violence: Not At Risk (02/15/2022)   Humiliation, Afraid, Rape, and Kick questionnaire    Fear of Current or Ex-Partner: No    Emotionally Abused: No    Physically Abused: No    Sexually Abused: No   Family History  Problem Relation Age of Onset   Diabetes Other    Hypertension Other    Colon cancer Neg Hx    Esophageal cancer Neg Hx    Inflammatory bowel disease Neg Hx    Liver disease Neg Hx    Pancreatic cancer Neg Hx    Gastric cancer Neg Hx    Rectal cancer Neg Hx    I have reviewed his medical, social, and family history in detail and updated the electronic medical record as necessary.    PHYSICAL EXAMINATION  BP 118/80 (BP Location: Left Arm, Patient Position: Sitting, Cuff Size: Normal)   Pulse (!) 107   Ht '6\' 5"'$  (1.956 m)   Wt 198 lb 6 oz (90 kg)   SpO2 97%   BMI 23.52 kg/m  Wt Readings from Last 3 Encounters:  05/01/22 198 lb 6 oz (90 kg)  03/06/22 198 lb 9.6 oz (90.1 kg)  02/20/22 195 lb  (88.5 kg)   GEN: NAD, appears stated age, doesn't appear chronically ill PSYCH: Cooperative, without pressured speech EYE: Conjunctivae pink, sclerae anicteric ENT: MMM, without oral ulcers, no erythema or exudates noted NECK: Supple CV: RR without R/Gs  RESP: CTAB posteriorly, without wheezing GI: NABS, soft, NT/ND, without rebound or guarding, no HSM appreciated GU: DRE shows MSK/EXT: _ edema, no palmar erythema SKIN: No jaundice, no spider angiomata, no concerning rashes NEURO:  Alert & Oriented x 3, no focal deficits, no evidence of asterixis   REVIEW OF DATA  I reviewed the following data at the time of this encounter:  GI Procedures and Studies  10/23 EGD - Normal esophagus. - 2 cm hiatal hernia. - Gastritis. Biopsied. - Normal examined duodenum. - No esophageal or gastric varices noted on this study.  Pathology FINAL MICROSCOPIC DIAGNOSIS:  A. STOMACH, BIOPSY:  - Antral and oxyntic mucosa with vascular dilatation and reactive  changes.  - No Helicobacter pylori identified.  - Differential includes portal gastropathy.   Laboratory Studies  ***  Imaging Studies  9/23 CTAP IMPRESSION: 1. Mild inflammatory changes surrounding the pancreas and duodenum suspicious for pancreatitis/duodenitis. No organized fluid collection 2. Hepatic steatosis with findings suggestive of cirrhosis 3. Lucent circumscribed lesions in the L3, L4 and L5 vertebral bodies suggestive of benign process such as hemangioma but not definitive. Consider correlation with nonemergent MRI.  9/23 RUQUS IMPRESSION: 1. Coarse echogenic liver parenchyma consistent with hepatic steatosis and or hepatocellular disease. 2. Otherwise negative right upper quadrant abdominal ultrasound   ASSESSMENT  Mr. Hoos is a 51 y.o. male with a pmh significant for The patient is seen today for evaluation and management of:  No diagnosis found.  ***   PLAN  There are no diagnoses linked to this  encounter.   No orders of the defined types were placed in this encounter.   New Prescriptions   No medications on file   Modified Medications   No medications on file    Planned Follow Up No follow-ups on file.   Total Time in Face-to-Face and in Coordination of Care for patient including independent/personal interpretation/review of prior testing, medical history, examination, medication adjustment, communicating results with the patient directly, and documentation within the EHR is ***.   Justice Britain, MD [facility] Office # [phone]

## 2022-05-02 ENCOUNTER — Ambulatory Visit (AMBULATORY_SURGERY_CENTER): Payer: Medicaid Other | Admitting: Gastroenterology

## 2022-05-02 ENCOUNTER — Other Ambulatory Visit (HOSPITAL_COMMUNITY): Payer: Self-pay

## 2022-05-02 ENCOUNTER — Encounter: Payer: Self-pay | Admitting: Gastroenterology

## 2022-05-02 ENCOUNTER — Telehealth: Payer: Self-pay

## 2022-05-02 VITALS — BP 145/88 | HR 80 | Temp 97.5°F | Resp 12 | Ht 77.0 in | Wt 198.0 lb

## 2022-05-02 DIAGNOSIS — D122 Benign neoplasm of ascending colon: Secondary | ICD-10-CM | POA: Diagnosis not present

## 2022-05-02 DIAGNOSIS — Z860100 Personal history of colon polyps, unspecified: Secondary | ICD-10-CM | POA: Insufficient documentation

## 2022-05-02 DIAGNOSIS — Z1211 Encounter for screening for malignant neoplasm of colon: Secondary | ICD-10-CM | POA: Diagnosis not present

## 2022-05-02 DIAGNOSIS — K852 Alcohol induced acute pancreatitis without necrosis or infection: Secondary | ICD-10-CM

## 2022-05-02 DIAGNOSIS — Z800 Family history of malignant neoplasm of digestive organs: Secondary | ICD-10-CM | POA: Insufficient documentation

## 2022-05-02 DIAGNOSIS — F1090 Alcohol use, unspecified, uncomplicated: Secondary | ICD-10-CM | POA: Insufficient documentation

## 2022-05-02 LAB — HEPATITIS A ANTIBODY, TOTAL: Hepatitis A AB,Total: REACTIVE — AB

## 2022-05-02 LAB — HEPATITIS B CORE ANTIBODY, TOTAL: Hep B Core Total Ab: NONREACTIVE

## 2022-05-02 LAB — HEPATITIS B SURFACE ANTIBODY,QUALITATIVE: Hep B S Ab: NONREACTIVE

## 2022-05-02 MED ORDER — POTASSIUM CHLORIDE CRYS ER 20 MEQ PO TBCR
20.0000 meq | EXTENDED_RELEASE_TABLET | ORAL | 0 refills | Status: DC
  Filled 2022-05-02: qty 10, 10d supply, fill #0

## 2022-05-02 NOTE — Op Note (Signed)
[place] Patient Name: [name] [name] Procedure Date: 05/02/2022 10:36 AM MRN: [id number] Endoscopist: Justice Britain , MD, [id number] Age: 51 Referring MD:  Date of Birth: 01-10-1971 Gender: Male Account #: [id number] Procedure:                Colonoscopy Indications:              Screening in patient at increased risk: Colorectal                            cancer in brother before age 62 Medicines:                Monitored Anesthesia Care Procedure:                Pre-Anesthesia Assessment:                           - Prior to the procedure, a History and Physical                            was performed, and patient medications and                            allergies were reviewed. The patient's tolerance of                            previous anesthesia was also reviewed. The risks                            and benefits of the procedure and the sedation                            options and risks were discussed with the patient.                            All questions were answered, and informed consent                            was obtained. Prior Anticoagulants: The patient has                            taken no anticoagulant or antiplatelet agents. ASA                            Grade Assessment: III - A patient with severe                            systemic disease. After reviewing the risks and                            benefits, the patient was deemed in satisfactory                            condition to undergo the procedure.  After obtaining informed consent, the colonoscope                            was passed under direct vision. Throughout the                            procedure, the patient's blood pressure, pulse, and                            oxygen saturations were monitored continuously. The                            Colonoscope was introduced through the anus and                            advanced to the 5  cm into the ileum. The                            colonoscopy was performed without difficulty. The                            patient tolerated the procedure. The quality of the                            bowel preparation was adequate. The terminal ileum,                            ileocecal valve, appendiceal orifice, and rectum                            were photographed. Scope In: 10:55:41 AM Scope Out: 64:15:83 AM Scope Withdrawal Time: 0 hours 7 minutes 43 seconds  Total Procedure Duration: 0 hours 11 minutes 8 seconds  Findings:                 Skin tags were found on perianal exam.                           The digital rectal exam findings include                            hemorrhoids. Pertinent negatives include no                            palpable rectal lesions.                           The terminal ileum and ileocecal valve appeared                            normal.                           A 7 mm polyp was found in the ascending colon. The  polyp was sessile. The polyp was removed with a                            cold snare. Resection and retrieval were complete.                           Normal mucosa was found in the entire colon                            otherwise.                           Non-bleeding non-thrombosed external and internal                            hemorrhoids were found during retroflexion, during                            perianal exam and during digital exam. The                            hemorrhoids were Grade II (internal hemorrhoids                            that prolapse but reduce spontaneously). Complications:            No immediate complications. Estimated Blood Loss:     Estimated blood loss was minimal. Impression:               - Perianal skin tags found on perianal exam.                           - Hemorrhoids found on digital rectal exam.                           - The examined portion of the ileum  was normal.                           - One 7 mm polyp in the ascending colon, removed                            with a cold snare. Resected and retrieved.                           - Normal mucosa in the entire examined colon                            otherwise.                           - Non-bleeding non-thrombosed external and internal                            hemorrhoids. Recommendation:           - The patient will be observed post-procedure,  until all discharge criteria are met.                           - Discharge patient to home.                           - Patient has a contact number available for                            emergencies. The signs and symptoms of potential                            delayed complications were discussed with the                            patient. Return to normal activities tomorrow.                            Written discharge instructions were provided to the                            patient.                           - High fiber diet.                           - Use FiberCon 1-2 tablets PO daily.                           - Continue present medications.                           - Await pathology results.                           - Repeat colonoscopy in 5 years for                            surveillance/screening (no matter pathology due to                            family history).                           - The findings and recommendations were discussed                            with the patient.                           - The findings and recommendations were discussed                            with the designated responsible adult. Justice Britain, MD 05/02/2022 11:11:26 AM

## 2022-05-02 NOTE — Patient Instructions (Signed)
Please read handouts provided. Continue present medications. Await pathology results. High Fiber Diet. FiberCon 1-2 tablets daily. Repeat colonoscopy in 5 years.   YOU HAD AN ENDOSCOPIC PROCEDURE TODAY AT [facility]:   Refer to the procedure report that was given to you for any specific questions about what was found during the examination.  If the procedure report does not answer your questions, please call your gastroenterologist to clarify.  If you requested that your care partner not be given the details of your procedure findings, then the procedure report has been included in a sealed envelope for you to review at your convenience later.  YOU SHOULD EXPECT: Some feelings of bloating in the abdomen. Passage of more gas than usual.  Walking can help get rid of the air that was put into your GI tract during the procedure and reduce the bloating. If you had a lower endoscopy (such as a colonoscopy or flexible sigmoidoscopy) you may notice spotting of blood in your stool or on the toilet paper. If you underwent a bowel prep for your procedure, you may not have a normal bowel movement for a few days.  Please Note:  You might notice some irritation and congestion in your nose or some drainage.  This is from the oxygen used during your procedure.  There is no need for concern and it should clear up in a day or so.  SYMPTOMS TO REPORT IMMEDIATELY:  Following lower endoscopy (colonoscopy or flexible sigmoidoscopy):  Excessive amounts of blood in the stool  Significant tenderness or worsening of abdominal pains  Swelling of the abdomen that is new, acute  Fever of 100F or higher.  For urgent or emergent issues, a gastroenterologist can be reached at any hour by calling [phone]. Do not use MyChart messaging for urgent concerns.    DIET:  We do recommend a small meal at first, but then you may proceed to your regular diet.  Drink plenty of fluids but you should avoid  alcoholic beverages for 24 hours.  ACTIVITY:  You should plan to take it easy for the rest of today and you should NOT DRIVE or use heavy machinery until tomorrow (because of the sedation medicines used during the test).    FOLLOW UP: Our staff will call the number listed on your records the next business day following your procedure.  We will call around 7:15- 8:00 am to check on you and address any questions or concerns that you may have regarding the information given to you following your procedure. If we do not reach you, we will leave a message.     If any biopsies were taken you will be contacted by phone or by letter within the next 1-3 weeks.  Please call us at [phone] if you have not heard about the biopsies in 3 weeks.    SIGNATURES/CONFIDENTIALITY: You and/or your care partner have signed paperwork which will be entered into your electronic medical record.  These signatures attest to the fact that that the information above on your After Visit Summary has been reviewed and is understood.  Full responsibility of the confidentiality of this discharge information lies with you and/or your care-partner.

## 2022-05-02 NOTE — Progress Notes (Unsigned)
Called to room to assist during endoscopic procedure.  Patient ID and intended procedure confirmed with present staff. Received instructions for my participation in the procedure from the performing physician.  

## 2022-05-02 NOTE — Progress Notes (Signed)
Pt's states no medical or surgical changes since previsit or office visit. 

## 2022-05-02 NOTE — Telephone Encounter (Signed)
-----   Message from Irving Copas., MD sent at 05/02/2022  4:42 AM EST ----- Cipriana Biller, Please let the patient know that his potassium is slightly low at 3.4 so he will benefit to initiate potassium 20 mill equivalents daily x 10 days.  His primary care doctor can follow-up his potassium levels from there. His liver enzymes remain elevated in a pattern that would be suggestive of alcohol use and as we discussed in clinic he should continue to work on decreasing that use is much as possible. Otherwise his CPK level is normal. His folate level is normal. His B12 level is normal. I will see him for his colonoscopy in the coming weeks. Thanks. GM

## 2022-05-02 NOTE — Progress Notes (Unsigned)
GASTROENTEROLOGY PROCEDURE H&P NOTE   Primary Care Physician: Dorna Mai, MD  HPI: [name] [name] is a 51 y.o. male who presents for Colonoscopy for family history of colon cancer and personal history of polyps.  Past Medical History:  Diagnosis Date   COLONIC POLYPS, BENIGN 01/20/2010   Annotation: 12/2009: path benign; rpt 2016 Qualifier: Diagnosis of  By: Jorene Minors, Scott     Hypertension    Past Surgical History:  Procedure Laterality Date   BIOPSY  02/18/2022   Procedure: BIOPSY;  Surgeon: Lavena Bullion, DO;  Location: [facility];  Service: Gastroenterology;;   ESOPHAGOGASTRODUODENOSCOPY N/A 02/18/2022   Procedure: ESOPHAGOGASTRODUODENOSCOPY (EGD);  Surgeon: Lavena Bullion, DO;  Location: [facility];  Service: Gastroenterology;  Laterality: N/A;   POLYPECTOMY     RIGHT/LEFT HEART CATH AND CORONARY ANGIOGRAPHY N/A 08/27/2016   Procedure: Right/Left Heart Cath and Coronary Angiography;  Surgeon: Sherren Mocha, MD;  Location: [facility];  Service: Cardiovascular;  Laterality: N/A;   Current Outpatient Medications  Medication Sig Dispense Refill   carvedilol (COREG) 12.5 MG tablet Take 1 tablet (12.5 mg total) by mouth 2 (two) times daily. 60 tablet 2   folic acid (FOLVITE) 1 MG tablet Take 1 tablet (1 mg total) by mouth daily. 30 tablet 2   Multiple Vitamin (MULTIVITAMIN WITH MINERALS) TABS tablet Take 1 tablet by mouth daily.     omeprazole (PRILOSEC) 20 MG capsule Take 1 capsule (20 mg total) by mouth 2 (two) times daily for 42 days, THEN 1 capsule (20 mg total) daily 90 capsule 0   sacubitril-valsartan (ENTRESTO) 97-103 MG Take 1 tablet by mouth 2 (two) times daily. 180 tablet 1   spironolactone (ALDACTONE) 25 MG tablet Take 1/2 tablet (12.5 mg total) by mouth daily. 15 tablet 2   tamsulosin (FLOMAX) 0.4 MG CAPS capsule Take 1 capsule (0.4 mg total) by mouth daily. 30 capsule 2   thiamine (VITAMIN B-1) 100 MG tablet Take 1 tablet (100 mg total) by  mouth daily. 30 tablet 2   Vitamin D, Ergocalciferol, (DRISDOL) 1.25 MG (50000 UNIT) CAPS capsule Take 1 capsule (50,000 Units total) by mouth every 7 (seven) days. 12 capsule 0   No current facility-administered medications for this visit.    Current Outpatient Medications:    carvedilol (COREG) 12.5 MG tablet, Take 1 tablet (12.5 mg total) by mouth 2 (two) times daily., Disp: 60 tablet, Rfl: 2   folic acid (FOLVITE) 1 MG tablet, Take 1 tablet (1 mg total) by mouth daily., Disp: 30 tablet, Rfl: 2   Multiple Vitamin (MULTIVITAMIN WITH MINERALS) TABS tablet, Take 1 tablet by mouth daily., Disp: , Rfl:    omeprazole (PRILOSEC) 20 MG capsule, Take 1 capsule (20 mg total) by mouth 2 (two) times daily for 42 days, THEN 1 capsule (20 mg total) daily, Disp: 90 capsule, Rfl: 0   sacubitril-valsartan (ENTRESTO) 97-103 MG, Take 1 tablet by mouth 2 (two) times daily., Disp: 180 tablet, Rfl: 1   spironolactone (ALDACTONE) 25 MG tablet, Take 1/2 tablet (12.5 mg total) by mouth daily., Disp: 15 tablet, Rfl: 2   tamsulosin (FLOMAX) 0.4 MG CAPS capsule, Take 1 capsule (0.4 mg total) by mouth daily., Disp: 30 capsule, Rfl: 2   thiamine (VITAMIN B-1) 100 MG tablet, Take 1 tablet (100 mg total) by mouth daily., Disp: 30 tablet, Rfl: 2   Vitamin D, Ergocalciferol, (DRISDOL) 1.25 MG (50000 UNIT) CAPS capsule, Take 1 capsule (50,000 Units total) by mouth every 7 (seven)  days., Disp: 12 capsule, Rfl: 0 No Known Allergies Family History  Problem Relation Age of Onset   Colon cancer Brother    Diabetes Other    Hypertension Other    Esophageal cancer Neg Hx    Inflammatory bowel disease Neg Hx    Liver disease Neg Hx    Pancreatic cancer Neg Hx    Gastric cancer Neg Hx    Rectal cancer Neg Hx    Stomach cancer Neg Hx    Social History   Socioeconomic History   Marital status: Single    Spouse name: Not on file   Number of children: Not on file   Years of education: Not on file   Highest education level:  Not on file  Occupational History   Not on file  Tobacco Use   Smoking status: Never   Smokeless tobacco: Never  Substance and Sexual Activity   Alcohol use: Yes    Comment: 3-4 Beers Daily   Drug use: Yes    Types: Marijuana    Comment: pt reports marijuana use occasionally   Sexual activity: Yes    Birth control/protection: None  Other Topics Concern   Not on file  Social History Narrative   Not on file   Social Determinants of Health   Financial Resource Strain: Not on file  Food Insecurity: No Food Insecurity (02/15/2022)   Hunger Vital Sign    Worried About Running Out of Food in the Last Year: Never true    Ran Out of Food in the Last Year: Never true  Transportation Needs: No Transportation Needs (02/15/2022)   PRAPARE - Hydrologist (Medical): No    Lack of Transportation (Non-Medical): No  Physical Activity: Not on file  Stress: Not on file  Social Connections: Not on file  Intimate Partner Violence: Not At Risk (02/15/2022)   Humiliation, Afraid, Rape, and Kick questionnaire    Fear of Current or Ex-Partner: No    Emotionally Abused: No    Physically Abused: No    Sexually Abused: No    Physical Exam: Today's Vitals   05/02/22 1002  BP: 128/81  Pulse: 87  Temp: (!) 97.5 F (36.4 C)  TempSrc: Temporal  SpO2: 97%  Weight: 198 lb (89.8 kg)  Height: '6\' 5"'$  (1.956 m)   Body mass index is 23.48 kg/m. GEN: NAD EYE: Sclerae anicteric ENT: MMM CV: Non-tachycardic GI: Soft, NT/ND NEURO:  Alert & Oriented x 3  Lab Results: No results for input(s): "WBC", "HGB", "HCT", "PLT" in the last 72 hours. BMET Recent Labs    05/01/22 0950  NA 137  K 3.4*  CL 97  CO2 25  GLUCOSE 103*  BUN 5*  CREATININE 0.63  CALCIUM 9.1   LFT Recent Labs    05/01/22 0950  PROT 9.3*  ALBUMIN 3.8  AST 110*  ALT 22  ALKPHOS 100  BILITOT 1.1   PT/INR No results for input(s): "LABPROT", "INR" in the last 72 hours.   Impression /  Plan: This is a 51 y.o.male who presents for Colonoscopy for family history of colon cancer and personal history of polyps.  The risks and benefits of endoscopic evaluation/treatment were discussed with the patient and/or family; these include but are not limited to the risk of perforation, infection, bleeding, missed lesions, lack of diagnosis, severe illness requiring hospitalization, as well as anesthesia and sedation related illnesses.  The patient's history has been reviewed, patient examined, no change in status,  and deemed stable for procedure.  The patient and/or family is agreeable to proceed.    Justice Britain, MD [facility] Office # [phone]

## 2022-05-02 NOTE — Telephone Encounter (Signed)
Patient was informed of labs. Lab results have been sent to PCP. Prescription for Potassium has been sent to patient pharmacy.

## 2022-05-02 NOTE — Progress Notes (Unsigned)
Report to PACU, RN, vss, BBS= Clear.  

## 2022-05-03 ENCOUNTER — Telehealth: Payer: Self-pay | Admitting: *Deleted

## 2022-05-03 ENCOUNTER — Other Ambulatory Visit (HOSPITAL_COMMUNITY): Payer: Self-pay

## 2022-05-03 ENCOUNTER — Other Ambulatory Visit: Payer: Self-pay

## 2022-05-03 NOTE — Telephone Encounter (Signed)
  Follow up Call-     05/02/2022   10:04 AM  Call back number  Post procedure Call Back phone  # [phone]  Permission to leave phone message Yes     Patient questions:  Do you have a fever, pain , or abdominal swelling? No. Pain Score  0 *  Have you tolerated food without any problems? Yes.    Have you been able to return to your normal activities? Yes.    Do you have any questions about your discharge instructions: Diet   No. Medications  No. Follow up visit  No.  Do you have questions or concerns about your Care? No.  Actions: * If pain score is 4 or above: No action needed, pain <4.

## 2022-05-04 ENCOUNTER — Encounter: Payer: Self-pay | Admitting: Gastroenterology

## 2022-05-18 ENCOUNTER — Other Ambulatory Visit (HOSPITAL_COMMUNITY): Payer: Self-pay

## 2022-05-29 ENCOUNTER — Other Ambulatory Visit (HOSPITAL_COMMUNITY): Payer: Self-pay

## 2022-05-29 ENCOUNTER — Other Ambulatory Visit: Payer: Self-pay

## 2022-06-29 ENCOUNTER — Telehealth: Payer: Self-pay | Admitting: Family Medicine

## 2022-06-29 NOTE — Telephone Encounter (Signed)
Shan from [employer] states that she is needing help getting the pt diagnosis codes from 13-Mar-2022.  Please advise  [phone]

## 2022-07-09 NOTE — Telephone Encounter (Signed)
Attempted to call [facility] back (x2)but was unable too get to the correct person.

## 2022-08-21 ENCOUNTER — Other Ambulatory Visit (HOSPITAL_COMMUNITY): Payer: Self-pay

## 2022-08-21 ENCOUNTER — Other Ambulatory Visit: Payer: Self-pay | Admitting: Family Medicine

## 2022-08-21 MED ORDER — TAMSULOSIN HCL 0.4 MG PO CAPS
0.4000 mg | ORAL_CAPSULE | Freq: Every day | ORAL | 2 refills | Status: DC
  Filled 2022-08-21 – 2022-08-27 (×2): qty 30, 30d supply, fill #0
  Filled 2022-09-25: qty 30, 30d supply, fill #1
  Filled 2022-10-09: qty 30, 30d supply, fill #2

## 2022-08-27 ENCOUNTER — Other Ambulatory Visit (HOSPITAL_COMMUNITY): Payer: Self-pay

## 2022-09-04 ENCOUNTER — Ambulatory Visit: Payer: Medicaid Other | Admitting: Family Medicine

## 2022-09-04 VITALS — BP 136/86 | HR 98 | Temp 98.1°F | Resp 16 | Wt 206.8 lb

## 2022-09-04 DIAGNOSIS — M25473 Effusion, unspecified ankle: Secondary | ICD-10-CM

## 2022-09-04 DIAGNOSIS — I10 Essential (primary) hypertension: Secondary | ICD-10-CM | POA: Diagnosis not present

## 2022-09-04 MED ORDER — FUROSEMIDE 20 MG PO TABS: ORAL_TABLET | ORAL | 0 refills | Status: DC

## 2022-09-04 NOTE — Progress Notes (Signed)
Patient is here for their 6 month follow-up Patient c/o his ankles swelling for 1 week. Patient is concern about what could be the cause Care gaps have been discussed with patient

## 2022-09-05 LAB — BASIC METABOLIC PANEL
BUN/Creatinine Ratio: 8 — ABNORMAL LOW (ref 9–20)
BUN: 5 mg/dL — ABNORMAL LOW (ref 6–24)
CO2: 23 mmol/L (ref 20–29)
Calcium: 8.6 mg/dL — ABNORMAL LOW (ref 8.7–10.2)
Chloride: 96 mmol/L (ref 96–106)
Creatinine, Ser: 0.66 mg/dL — ABNORMAL LOW (ref 0.76–1.27)
Glucose: 95 mg/dL (ref 70–99)
Potassium: 4.5 mmol/L (ref 3.5–5.2)
Sodium: 139 mmol/L (ref 134–144)
eGFR: 112 mL/min/{1.73_m2} (ref >59)

## 2022-09-10 ENCOUNTER — Encounter: Payer: Self-pay | Admitting: Family Medicine

## 2022-09-10 NOTE — Progress Notes (Signed)
Established Patient Office Visit  Subjective    Patient ID: [name] [name], male    DOB: 01/05/1971  Age: 52 y.o. MRN: [id number]  CC: No chief complaint on file.   HPI [name] presents for follow up of hypertension. Patient also reports that he has been having some swelling in his feet and ankles.    Outpatient Encounter Medications as of 09/04/2022  Medication Sig   folic acid (FOLVITE) 1 MG tablet Take 1 tablet (1 mg total) by mouth daily.   furosemide (LASIX) 20 MG tablet Use 1 po daily prn for increased ankle swelling   Multiple Vitamin (MULTIVITAMIN WITH MINERALS) TABS tablet Take 1 tablet by mouth daily.   potassium chloride SA (KLOR-CON M) 20 MEQ tablet Take 1 tablet (20 mEq total) by mouth once daily   sacubitril-valsartan (ENTRESTO) 97-103 MG Take 1 tablet by mouth 2 (two) times daily.   spironolactone (ALDACTONE) 25 MG tablet Take 1/2 tablet (12.5 mg total) by mouth daily.   tamsulosin (FLOMAX) 0.4 MG CAPS capsule Take 1 capsule (0.4 mg total) by mouth daily.   thiamine (VITAMIN B-1) 100 MG tablet Take 1 tablet (100 mg total) by mouth daily.   Vitamin D, Ergocalciferol, (DRISDOL) 1.25 MG (50000 UNIT) CAPS capsule Take 1 capsule (50,000 Units total) by mouth every 7 (seven) days.   carvedilol (COREG) 12.5 MG tablet Take 1 tablet (12.5 mg total) by mouth 2 (two) times daily.   omeprazole (PRILOSEC) 20 MG capsule Take 1 capsule (20 mg total) by mouth 2 (two) times daily for 42 days, THEN 1 capsule (20 mg total) daily   No facility-administered encounter medications on file as of 09/04/2022.    Past Medical History:  Diagnosis Date   COLONIC POLYPS, BENIGN 01/20/2010   Annotation: 12/2009: path benign; rpt 2016 Qualifier: Diagnosis of  By: Huntley Dec, Scott     Hypertension     Past Surgical History:  Procedure Laterality Date   BIOPSY  02/18/2022   Procedure: BIOPSY;  Surgeon: Shellia Cleverly, DO;  Location: [facility];  Service: Gastroenterology;;    ESOPHAGOGASTRODUODENOSCOPY N/A 02/18/2022   Procedure: ESOPHAGOGASTRODUODENOSCOPY (EGD);  Surgeon: Shellia Cleverly, DO;  Location: [facility];  Service: Gastroenterology;  Laterality: N/A;   POLYPECTOMY     RIGHT/LEFT HEART CATH AND CORONARY ANGIOGRAPHY N/A 08/27/2016   Procedure: Right/Left Heart Cath and Coronary Angiography;  Surgeon: Tonny Bollman, MD;  Location: [facility];  Service: Cardiovascular;  Laterality: N/A;    Family History  Problem Relation Age of Onset   Colon cancer Brother    Diabetes Other    Hypertension Other    Esophageal cancer Neg Hx    Inflammatory bowel disease Neg Hx    Liver disease Neg Hx    Pancreatic cancer Neg Hx    Gastric cancer Neg Hx    Rectal cancer Neg Hx    Stomach cancer Neg Hx     Social History   Socioeconomic History   Marital status: Single    Spouse name: Not on file   Number of children: Not on file   Years of education: Not on file   Highest education level: Not on file  Occupational History   Not on file  Tobacco Use   Smoking status: Never   Smokeless tobacco: Never  Substance and Sexual Activity   Alcohol use: Yes    Comment: 3-4 Beers Daily   Drug use: Yes    Types: Marijuana  Comment: pt reports marijuana use occasionally   Sexual activity: Yes    Birth control/protection: None  Other Topics Concern   Not on file  Social History Narrative   Not on file   Social Determinants of Health   Financial Resource Strain: Low Risk  (09/04/2022)   Overall Financial Resource Strain (CARDIA)    Difficulty of Paying Living Expenses: Not very hard  Food Insecurity: No Food Insecurity (02/15/2022)   Hunger Vital Sign    Worried About Running Out of Food in the Last Year: Never true    Ran Out of Food in the Last Year: Never true  Transportation Needs: No Transportation Needs (02/15/2022)   PRAPARE - Administrator, Civil Service (Medical): No    Lack of Transportation (Non-Medical): No  Physical  Activity: Inactive (09/04/2022)   Exercise Vital Sign    Days of Exercise per Week: 0 days    Minutes of Exercise per Session: 0 min  Stress: No Stress Concern Present (09/04/2022)   [employer] of Occupational Health - Occupational Stress Questionnaire    Feeling of Stress : Not at all  Social Connections: Not on file  Intimate Partner Violence: Not At Risk (02/15/2022)   Humiliation, Afraid, Rape, and Kick questionnaire    Fear of Current or Ex-Partner: No    Emotionally Abused: No    Physically Abused: No    Sexually Abused: No    Review of Systems  Respiratory:  Negative for cough and shortness of breath.   Cardiovascular:  Positive for leg swelling. Negative for chest pain.  All other systems reviewed and are negative.       Objective    BP 136/86   Pulse 98   Temp 98.1 F (36.7 C) (Oral)   Resp 16   Wt 206 lb 12.8 oz (93.8 kg)   SpO2 95%   BMI 24.52 kg/m   Physical Exam Vitals and nursing note reviewed.  Constitutional:      General: He is not in acute distress.    Appearance: He is well-developed.  Cardiovascular:     Rate and Rhythm: Normal rate and regular rhythm.  Pulmonary:     Effort: Pulmonary effort is normal.     Breath sounds: Normal breath sounds.  Musculoskeletal:     Right lower leg: Edema present.     Left lower leg: Edema present.  Skin:    General: Skin is warm and dry.  Neurological:     Mental Status: He is alert and oriented to person, place, and time.         Assessment & Plan:   1. Essential hypertension Appears stable. Continue   2. Ankle swelling, unspecified laterality Lasix prescribed. Monitoring labs ordered - Basic Metabolic Panel    No follow-ups on file.   Tommie Raymond, MD

## 2022-09-17 ENCOUNTER — Other Ambulatory Visit: Payer: Self-pay

## 2022-09-17 ENCOUNTER — Ambulatory Visit
Admission: EM | Admit: 2022-09-17 | Discharge: 2022-09-17 | Disposition: A | Discharge status: Home / Self Care | Payer: Medicaid Other

## 2022-09-17 DIAGNOSIS — M7021 Olecranon bursitis, right elbow: Secondary | ICD-10-CM

## 2022-09-17 NOTE — ED Triage Notes (Signed)
Pt c/o right elbow edema onset ~ 2 days ago denies direct trauma/injury.

## 2022-09-17 NOTE — ED Provider Notes (Signed)
[facility]    CSN: [id number] Arrival date & time: 09/17/22  4098      History   Chief Complaint Chief Complaint  Patient presents with   Elbow Pain    HPI RAJON BISIG is a 52 y.o. male.   Patient presents with right elbow swelling that started about 2 days ago.  Patient reports it is minimally painful.  Denies numbness or tingling.  Denies any obvious injury to the area.  Denies history of similar symptoms. Denies fever.      Past Medical History:  Diagnosis Date   COLONIC POLYPS, BENIGN 01/20/2010   Annotation: 12/2009: path benign; rpt 2016 Qualifier: Diagnosis of  By: Huntley Dec, Scott     Hypertension     Patient Active Problem List   Diagnosis Date Noted   Alcohol use disorder 05/02/2022   Family history of colon cancer 05/02/2022   History of colon polyps 05/02/2022   Gastritis and gastroduodenitis    Hiatal hernia    Acute pancreatitis 02/16/2022   Hematemesis with nausea    Thrombocytopenia (HCC)    Cirrhosis of liver (HCC) 02/15/2022   Elevated LFTs 02/15/2022   Hyponatremia 02/15/2022   Lesion of lumbar spine 02/15/2022   Duodenitis 02/14/2022   Encounter for orthopedic follow-up care 07/25/2020   Ganglion cyst 06/22/2020   Benign prostatic hyperplasia with nocturia 11/09/2019   Chronic systolic CHF (congestive heart failure) (HCC) 01/23/2017   Non-ischemic cardiomyopathy (HCC) 09/19/2016   HLD (hyperlipidemia) 09/19/2016   ETOH abuse 09/19/2016   Moderate mitral regurgitation 09/05/2016   Cocaine abuse (HCC) 08/23/2016   Positive D dimer 08/23/2016   Hypokalemia 08/23/2016   Macrocytosis 08/23/2016   Alcohol use 08/20/2016   Major depressive disorder, 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   Essential hypertension 04/20/2008    Past Surgical History:  Procedure Laterality Date   BIOPSY  02/18/2022   Procedure: BIOPSY;  Surgeon: Shellia Cleverly, DO;  Location: [facility]  END[facility];  Service: Gastroenterology;;   ESOPHAGOGASTRODUODENOSCOPY N/A 02/18/2022   Procedure: ESOPHAGOGASTRODUODENOSCOPY (EGD);  Surgeon: Shellia Cleverly, DO;  Location: [facility];  Service: Gastroenterology;  Laterality: N/A;   POLYPECTOMY     RIGHT/LEFT HEART CATH AND CORONARY ANGIOGRAPHY N/A 08/27/2016   Procedure: Right/Left Heart Cath and Coronary Angiography;  Surgeon: Tonny Bollman, MD;  Location: [facility] INVASIVE CV LAB;  Service: Cardiovascular;  Laterality: N/A;       Home Medications    Prior to Admission medications   Medication Sig Start Date End Date Taking? Authorizing Provider  carvedilol (COREG) 12.5 MG tablet Take 1 tablet (12.5 mg total) by mouth 2 (two) times daily. 04/18/22 07/17/22  Georganna Skeans, MD  folic acid (FOLVITE) 1 MG tablet Take 1 tablet (1 mg total) by mouth daily. 02/19/22   [place], Alvira Philips, DO  furosemide (LASIX) 20 MG tablet Use 1 po daily prn for increased ankle swelling 09/04/22   Georganna Skeans, MD  Multiple Vitamin (MULTIVITAMIN WITH MINERALS) TABS tablet Take 1 tablet by mouth daily. 02/19/22   [place], Alvira Philips, DO  omeprazole (PRILOSEC) 20 MG capsule Take 1 capsule (20 mg total) by mouth 2 (two) times daily for 42 days, THEN 1 capsule (20 mg total) daily 04/18/22 06/06/22  Georganna Skeans, MD  potassium chloride SA (KLOR-CON M) 20 MEQ tablet Take 1 tablet (20 mEq total) by mouth once daily 05/02/22   Mansouraty, Netty Starring., MD  sacubitril-valsartan (ENTRESTO) 97-103 MG Take 1 tablet by mouth  2 (two) times daily. 03/06/22   Georganna Skeans, MD  spironolactone (ALDACTONE) 25 MG tablet Take 1/2 tablet (12.5 mg total) by mouth daily. 04/18/22   Georganna Skeans, MD  tamsulosin (FLOMAX) 0.4 MG CAPS capsule Take 1 capsule (0.4 mg total) by mouth daily. 08/21/22   Georganna Skeans, MD  thiamine (VITAMIN B-1) 100 MG tablet Take 1 tablet (100 mg total) by mouth daily. 02/19/22   [place], Alvira Philips, DO  Vitamin D, Ergocalciferol, (DRISDOL) 1.25 MG (50000 UNIT) CAPS capsule  Take 1 capsule (50,000 Units total) by mouth every 7 (seven) days. 03/07/22   Georganna Skeans, MD    Family History Family History  Problem Relation Age of Onset   Colon cancer Brother    Diabetes Other    Hypertension Other    Esophageal cancer Neg Hx    Inflammatory bowel disease Neg Hx    Liver disease Neg Hx    Pancreatic cancer Neg Hx    Gastric cancer Neg Hx    Rectal cancer Neg Hx    Stomach cancer Neg Hx     Social History Social History   Tobacco Use   Smoking status: Never   Smokeless tobacco: Never  Substance Use Topics   Alcohol use: Yes    Comment: 3-4 Beers Daily   Drug use: Yes    Types: Marijuana    Comment: pt reports marijuana use occasionally     Allergies   Patient has no known allergies.   Review of Systems Review of Systems Per HPI  Physical Exam Triage Vital Signs ED Triage Vitals [09/17/22 0927]  Enc Vitals Group     BP (!) 150/95     Pulse Rate 100     Resp 16     Temp 98.2 F (36.8 C)     Temp Source Oral     SpO2 96 %     Weight      Height      Head Circumference      Peak Flow      Pain Score 6     Pain Loc      Pain Edu?      Excl. in GC?    No data found.  Updated Vital Signs BP (!) 150/95 (BP Location: Right Arm)   Pulse 100   Temp 98.2 F (36.8 C) (Oral)   Resp 16   SpO2 96%   Visual Acuity Right Eye Distance:   Left Eye Distance:   Bilateral Distance:    Right Eye Near:   Left Eye Near:    Bilateral Near:     Physical Exam Constitutional:      General: He is not in acute distress.    Appearance: Normal appearance. He is not toxic-appearing or diaphoretic.  HENT:     Head: Normocephalic and atraumatic.  Eyes:     Extraocular Movements: Extraocular movements intact.     Conjunctiva/sclera: Conjunctivae normal.  Pulmonary:     Effort: Pulmonary effort is normal.  Musculoskeletal:     Comments: Patient has a large area of fluctuance overlying the right olecranon.  No discoloration noted.  No  warmth noted.  Patient can fully extend the elbow.  Grip strength is 5/5.  Neurovascular intact.  Neurological:     General: No focal deficit present.     Mental Status: He is alert and oriented to person, place, and time. Mental status is at baseline.  Psychiatric:        Mood and Affect:  Mood normal.        Behavior: Behavior normal.        Thought Content: Thought content normal.        Judgment: Judgment normal.      UC Treatments / Results  Labs (all labs ordered are listed, but only abnormal results are displayed) Labs Reviewed - No data to display  EKG   Radiology No results found.  Procedures Procedures (including critical care time)  Medications Ordered in UC Medications - No data to display  Initial Impression / Assessment and Plan / UC Course  I have reviewed the triage vital signs and the nursing notes.  Pertinent labs & imaging results that were available during my care of the patient were reviewed by me and considered in my medical decision making (see chart for details).     Patient's physical exam is consistent with right olecranon bursitis.  Patient has large amount of swelling which I do think is conducive to drainage as soon as possible.  Do not have these capabilities here in urgent care so an appointment was made for patient with orthopedic specialist at [facility] orthopedics at 3:15 pm today.  He was agreeable with this plan. Final Clinical Impressions(s) / UC Diagnoses   Final diagnoses:  Olecranon bursitis of right elbow     Discharge Instructions      Please go to [facility] today at 3:15 PM to be seen by orthopedic specialist.    ED Prescriptions   None    PDMP not reviewed this encounter.   Gustavus Bryant, [place] 09/17/22 [phone]

## 2022-09-17 NOTE — Discharge Instructions (Signed)
Please go to [facility] today at 3:15 PM to be seen by orthopedic specialist.

## 2022-09-25 ENCOUNTER — Other Ambulatory Visit (HOSPITAL_COMMUNITY): Payer: Self-pay

## 2022-09-25 ENCOUNTER — Other Ambulatory Visit: Payer: Self-pay

## 2022-10-09 ENCOUNTER — Other Ambulatory Visit: Payer: Self-pay | Admitting: Family Medicine

## 2022-10-09 ENCOUNTER — Other Ambulatory Visit: Payer: Self-pay

## 2022-10-09 ENCOUNTER — Other Ambulatory Visit (HOSPITAL_COMMUNITY): Payer: Self-pay

## 2022-10-09 MED ORDER — SPIRONOLACTONE 25 MG PO TABS
12.5000 mg | ORAL_TABLET | Freq: Every day | ORAL | 2 refills | Status: DC
  Filled 2022-10-09: qty 15, 30d supply, fill #0

## 2022-10-25 ENCOUNTER — Other Ambulatory Visit (HOSPITAL_COMMUNITY): Payer: Self-pay

## 2022-10-31 ENCOUNTER — Other Ambulatory Visit: Payer: Self-pay | Admitting: Family Medicine

## 2022-10-31 NOTE — Telephone Encounter (Addendum)
Medication Refill - Medication:  folic acid (FOLVITE) 1 MG tablet furosemide (LASIX) 20 MG tablet Multiple Vitamin (MULTIVITAMIN WITH MINERALS) TABS tablet potassium chloride SA (KLOR-CON M) 20 MEQ tablet sacubitril-valsartan (ENTRESTO) 97-103 MG spironolactone (ALDACTONE) 25 MG table tamsulosin (FLOMAX) 0.4 MG CAPS capsule Rx #: [id number]  thiamine (VITAMIN B1) 100 MG tablet  Vitamin D, Ergocalciferol, (DRISDOL) 1.25 MG (50000 UNIT) CAPS capsule    Pt states he has been out of medication for a week. Pt has an appt on 11/02/22.   Has the patient contacted their pharmacy? No. Preferred Pharmacy (with phone number or street name):  [facility] - [place], [place] - [address] Phone: [phone]  Fax: [phone]     Has the patient been seen for an appointment in the last year OR does the patient have an upcoming appointment? Yes.    Agent: Please be advised that RX refills may take up to 3 business days. We ask that you follow-up with your pharmacy.

## 2022-11-01 MED ORDER — SPIRONOLACTONE 25 MG PO TABS: 12.5000 mg | ORAL_TABLET | Freq: Every day | ORAL | 1 refills | Status: DC

## 2022-11-01 MED ORDER — TAMSULOSIN HCL 0.4 MG PO CAPS: 0.4000 mg | ORAL_CAPSULE | Freq: Every day | ORAL | 0 refills | Status: DC

## 2022-11-01 NOTE — Progress Notes (Signed)
Patient ID: [name] [name], male    DOB: October 27, 1970  MRN: [id number]  CC: Medication Refills   Subjective: [name] [name] is a 52 y.o. male who presents for medication refills.  His concerns today include:  - Patient states "I need all of my medications refilled" by my primary provider. Doing well on regimen, no issues/concerns. Denies associated red flag symptoms. - Left ankle swelling persisting. He denies associated red flag symptoms. - Established with Orthopedics for right elbow pain. - He denies thoughts of self-harm, suicidal ideations, homicidal ideations. States he must have "circled the wrong thing". - No further issues/concerns for discussion today.  Patient Active Problem List   Diagnosis Date Noted   Alcohol use disorder 05/02/2022   Family history of colon cancer 05/02/2022   History of colon polyps 05/02/2022   Gastritis and gastroduodenitis    Hiatal hernia    Acute pancreatitis 02/16/2022   Hematemesis with nausea    Thrombocytopenia (HCC)    Cirrhosis of liver (HCC) 02/15/2022   Elevated LFTs 02/15/2022   Hyponatremia 02/15/2022   Lesion of lumbar spine 02/15/2022   Duodenitis 02/14/2022   Encounter for orthopedic follow-up care 07/25/2020   Ganglion cyst 06/22/2020   Benign prostatic hyperplasia with nocturia 11/09/2019   Chronic systolic CHF (congestive heart failure) (HCC) 01/23/2017   Non-ischemic cardiomyopathy (HCC) 09/19/2016   HLD (hyperlipidemia) 09/19/2016   ETOH abuse 09/19/2016   Moderate mitral regurgitation 09/05/2016   Cocaine abuse (HCC) 08/23/2016   Positive D dimer 08/23/2016   Hypokalemia 08/23/2016   Macrocytosis 08/23/2016   Alcohol use 08/20/2016   Major depressive disorder, 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 30 tablet 2   Multiple Vitamin (MULTIVITAMIN WITH MINERALS) TABS tablet Take 1 tablet by mouth daily.     thiamine (VITAMIN B1) 100 MG tablet Take 1 tablet (100 mg total) by mouth daily. 100 tablet 0   Vitamin D, Ergocalciferol, (DRISDOL) 1.25 MG (50000 UNIT) CAPS capsule Take 1 capsule (50,000 Units total) by mouth every 7 (seven) days. 12 capsule 0   omeprazole (PRILOSEC) 20 MG capsule Take 1 capsule (20 mg total) by mouth 2 (two) times daily for 42 days, THEN 1 capsule (20 mg total) daily 90 capsule 0   potassium chloride SA (KLOR-CON M) 20 MEQ tablet Take 1 tablet (20 mEq total) by mouth once daily (Patient not taking: Reported on 11/02/2022) 10 tablet 0   No current facility-administered medications on file prior to visit.    No Known Allergies  Social History   Socioeconomic History   Marital status: Single    Spouse name: Not on file   Number of children: Not on file   Years of education: Not on file   Highest education level: Not on file  Occupational History   Not on file  Tobacco Use   Smoking status: Never   Smokeless tobacco: Never  Substance and Sexual Activity   Alcohol use: Yes    Comment: 3-4 Beers Daily   Drug use: Yes    Types: Marijuana    Comment: pt reports marijuana use occasionally   Sexual activity: Yes    Birth control/protection: None  Other Topics Concern   Not on file  Social History Narrative  Not on file   Social Determinants of Health   Financial Resource Strain: Low Risk  (09/04/2022)   Overall Financial Resource Strain (CARDIA)    Difficulty of Paying Living Expenses: Not very hard  Food Insecurity: No Food Insecurity (02/15/2022)   Hunger Vital Sign    Worried About Running Out of Food in the Last Year: Never true    Ran Out of Food in the Last Year: Never true  Transportation Needs: No Transportation Needs (02/15/2022)   PRAPARE - Administrator, Civil Service (Medical): No     Lack of Transportation (Non-Medical): No  Physical Activity: Inactive (09/04/2022)   Exercise Vital Sign    Days of Exercise per Week: 0 days    Minutes of Exercise per Session: 0 min  Stress: No Stress Concern Present (09/04/2022)   [employer] of Occupational Health - Occupational Stress Questionnaire    Feeling of Stress : Not at all  Social Connections: Not on file  Intimate Partner Violence: Not At Risk (02/15/2022)   Humiliation, Afraid, Rape, and Kick questionnaire    Fear of Current or Ex-Partner: No    Emotionally Abused: No    Physically Abused: No    Sexually Abused: No    Family History  Problem Relation Age of Onset   Colon cancer Brother    Diabetes Other    Hypertension Other    Esophageal cancer Neg Hx    Inflammatory bowel disease Neg Hx    Liver disease Neg Hx    Pancreatic cancer Neg Hx    Gastric cancer Neg Hx    Rectal cancer Neg Hx    Stomach cancer Neg Hx     Past Surgical History:  Procedure Laterality Date   BIOPSY  02/18/2022   Procedure: BIOPSY;  Surgeon: Shellia Cleverly, DO;  Location: [facility];  Service: Gastroenterology;;   ESOPHAGOGASTRODUODENOSCOPY N/A 02/18/2022   Procedure: ESOPHAGOGASTRODUODENOSCOPY (EGD);  Surgeon: Shellia Cleverly, DO;  Location: [facility];  Service: Gastroenterology;  Laterality: N/A;   POLYPECTOMY     RIGHT/LEFT HEART CATH AND CORONARY ANGIOGRAPHY N/A 08/27/2016   Procedure: Right/Left Heart Cath and Coronary Angiography;  Surgeon: Tonny Bollman, MD;  Location: [facility] INVASIVE CV LAB;  Service: Cardiovascular;  Laterality: N/A;    ROS: Review of Systems Negative except as stated above  PHYSICAL EXAM: BP 117/69   Pulse 99   Temp 98.3 F (36.8 C) (Oral)   Ht 6\' 5"  (1.956 m)   Wt 210 lb (95.3 kg)   SpO2 93%   BMI 24.90 kg/m   Physical Exam HENT:     Head: Normocephalic and atraumatic.     Nose: Nose normal.     Mouth/Throat:     Mouth: Mucous membranes are moist.     Pharynx: Oropharynx is  clear.  Eyes:     Extraocular Movements: Extraocular movements intact.     Conjunctiva/sclera: Conjunctivae normal.     Pupils: Pupils are equal, round, and reactive to light.  Cardiovascular:     Rate and Rhythm: Normal rate and regular rhythm.     Pulses: Normal pulses.     Heart sounds: Normal heart sounds.  Pulmonary:     Effort: Pulmonary effort is normal.     Breath sounds: Normal breath sounds.  Musculoskeletal:     Right shoulder: Normal.     Left shoulder: Normal.     Right upper arm: Normal.     Left upper arm: Normal.  Right elbow: Normal.     Left elbow: Normal.     Right forearm: Normal.     Left forearm: Normal.     Right wrist: Normal.     Left wrist: Normal.     Right hand: Normal.     Left hand: Normal.     Cervical back: Normal, normal range of motion and neck supple.     Thoracic back: Normal.     Lumbar back: Normal.     Right hip: Normal.     Left hip: Normal.     Right upper leg: Normal.     Left upper leg: Normal.     Right knee: Normal.     Left knee: Normal.     Right lower leg: Normal.     Left lower leg: Normal.     Right ankle: Normal.     Left ankle: Swelling present.     Right foot: Normal.     Left foot: Normal.  Neurological:     General: No focal deficit present.     Mental Status: He is alert and oriented to person, place, and time.  Psychiatric:        Mood and Affect: Mood normal.        Behavior: Behavior normal.    ASSESSMENT AND PLAN: 1. Essential hypertension - Continue Spironolactone and Carvedilol as prescribed.  - Counseled on blood pressure goal of less than 130/80, low-sodium, DASH diet, medication compliance, and 150 minutes of moderate intensity exercise per week as tolerated. Counseled on medication adherence and adverse effects. - Follow-up with primary provider as scheduled.  - spironolactone (ALDACTONE) 25 MG tablet; Take 1/2 tablet (12.5 mg total) by mouth daily.  Dispense: 15 tablet; Refill: 2 - carvedilol  (COREG) 12.5 MG tablet; Take 1 tablet (12.5 mg total) by mouth 2 (two) times daily.  Dispense: 60 tablet; Refill: 2  2. Benign prostatic hyperplasia, unspecified whether lower urinary tract symptoms present - Continue Tamsulosin as prescribed. Counseled on medication adherence/adverse effects.  - Follow-up with primary provider as scheduled. - tamsulosin (FLOMAX) 0.4 MG CAPS capsule; Take 1 capsule (0.4 mg total) by mouth daily.  Dispense: 30 capsule; Refill: 2  3. Left ankle swelling - Continue Furosemide as prescribed. Counseled on medication adherence/adverse effects.  - Referral to Orthopedics for further evaluation/management. During the interim follow-up with primary provider in 4 weeks or sooner if needed until established with referral. - furosemide (LASIX) 20 MG tablet; Use 1 po daily prn for increased ankle swelling  Dispense: 30 tablet; Refill: 0 - Ambulatory referral to Orthopedics  4. Chronic systolic CHF (congestive heart failure) (HCC) - Continue Sacubitril-Valsartan as prescribed. Counseled on medication adherence/adverse effects.  - Follow-up with primary provider as scheduled.  - sacubitril-valsartan (ENTRESTO) 97-103 MG; Take 1 tablet by mouth 2 (two) times daily.  Dispense: 180 tablet; Refill: 0  5. Gastroesophageal reflux disease, unspecified whether esophagitis present - Continue Omeprazole as prescribed. Counseled on medication adherence/adverse effects.  - Follow-up with primary provider as scheduled.  - omeprazole (PRILOSEC) 20 MG capsule; Take 1 capsule (20 mg total) by mouth daily.  Dispense: 90 capsule; Refill: 0   Patient was given the opportunity to ask questions.  Patient verbalized understanding of the plan and was able to repeat key elements of the plan. Patient was given clear instructions to go to Emergency Department or return to medical center if symptoms don't improve, worsen, or new problems develop.The patient verbalized understanding.   Orders  Placed This  Encounter  Procedures   Ambulatory referral to Orthopedics    Requested Prescriptions   Signed Prescriptions Disp Refills   sacubitril-valsartan (ENTRESTO) 97-103 MG 180 tablet 0    Sig: Take 1 tablet by mouth 2 (two) times daily.   spironolactone (ALDACTONE) 25 MG tablet 15 tablet 2    Sig: Take 1/2 tablet (12.5 mg total) by mouth daily.   tamsulosin (FLOMAX) 0.4 MG CAPS capsule 30 capsule 2    Sig: Take 1 capsule (0.4 mg total) by mouth daily.   carvedilol (COREG) 12.5 MG tablet 60 tablet 2    Sig: Take 1 tablet (12.5 mg total) by mouth 2 (two) times daily.   furosemide (LASIX) 20 MG tablet 30 tablet 0    Sig: Use 1 po daily prn for increased ankle swelling   omeprazole (PRILOSEC) 20 MG capsule 90 capsule 0    Sig: Take 1 capsule (20 mg total) by mouth daily.    Return in about 4 weeks (around 11/30/2022) for Follow-Up or next available Georganna Skeans, MD.  Rema Fendt, NP

## 2022-11-01 NOTE — Telephone Encounter (Signed)
Requested medication (s) are due for refill today: na   Requested medication (s) are on the active medication list: yes   Last refill:  folvite 02/19/22 #30 2 refills, lasix- 09/03/21 #30 0 refills, MVI- 02/19/22, potassium - 05/02/22 #10 0 refills, entrsto 03/06/22 #180 1 refill, vit B1- 02/19/22 #100 0 refills, vit D 03/07/22 # 12 0 refills  Future visit scheduled: yes tomorrow  Notes to clinic:  folvite last ordered by Minerva Areola [place] DO 02/18/22, lasix no refills remain, potassium last ordered by Randel Pigg. MD 05/02/22, entresto medication not assigned to a protocol, vit B1 not assigned to a protocol, last ordered by Eric [place], DO 02/18/22, vit D no refills remain. Do you want to refill Rxs?     Requested Prescriptions  Pending Prescriptions Disp Refills   folic acid (FOLVITE) 1 MG tablet 30 tablet 2    Sig: Take 1 tablet (1 mg total) by mouth daily.     Endocrinology:  Vitamins Passed - 10/31/2022  2:12 PM      Passed - Valid encounter within last 12 months    Recent Outpatient Visits           1 month ago Essential hypertension   [place] [facility], Lauris Poag, MD   8 months ago Annual physical exam   [place] [facility], MD   1 year ago Essential hypertension   [place] [facility], MD   1 year ago Essential hypertension   [place] [facility], Gildardo Pounds, NP   1 year ago Benign prostatic hyperplasia with nocturia   [place] [facility], Gildardo Pounds, NP       Future Appointments             Tomorrow Rema Fendt, NP [place] [facility]             furosemide (LASIX) 20 MG tablet 30 tablet 0    Sig: Use 1 po daily prn for increased ankle swelling     Cardiovascular:  Diuretics - Loop Failed - 10/31/2022  2:12 PM      Failed - Ca in normal range and within 180 days    Calcium   Date Value Ref Range Status  09/04/2022 8.6 (L) 8.7 - 10.2 mg/dL Final   Calcium, Ion  Date Value Ref Range Status  08/11/2008 1.19 1.12 - 1.32 mmol/L Final         Failed - Cr in normal range and within 180 days    Creatinine, Ser  Date Value Ref Range Status  09/04/2022 0.66 (L) 0.76 - 1.27 mg/dL Final         Failed - Mg Level in normal range and within 180 days    Magnesium  Date Value Ref Range Status  02/18/2022 1.8 1.7 - 2.4 mg/dL Final    Comment:    Performed at [facility], [address] [place], [place] [address]         Failed - Last BP in normal range    BP Readings from Last 1 Encounters:  09/17/22 (!) 150/95         Passed - K in normal range and within 180 days    Potassium  Date Value Ref Range Status  09/04/2022 4.5 3.5 - 5.2 mmol/L Final  Passed - Na in normal range and within 180 days    Sodium  Date Value Ref Range Status  09/04/2022 139 134 - 144 mmol/L Final         Passed - Cl in normal range and within 180 days    Chloride  Date Value Ref Range Status  09/04/2022 96 96 - 106 mmol/L Final         Passed - Valid encounter within last 6 months    Recent Outpatient Visits           1 month ago Essential hypertension   [place] [facility], MD   8 months ago Annual physical exam   [place] [facility], MD   1 year ago Essential hypertension   [place] [facility], MD   1 year ago Essential hypertension   [place] [facility], Gildardo Pounds, NP   1 year ago Benign prostatic hyperplasia with nocturia   [place] [facility], Gildardo Pounds, NP       Future Appointments             Tomorrow Rema Fendt, NP [place] [facility]             Multiple Vitamin (MULTIVITAMIN WITH MINERALS) TABS tablet      Sig: Take 1  tablet by mouth daily.     There is no refill protocol information for this order     potassium chloride SA (KLOR-CON M) 20 MEQ tablet 10 tablet 0    Sig: Take 1 tablet (20 mEq total) by mouth once daily     Endocrinology:  Minerals - Potassium Supplementation Failed - 10/31/2022  2:12 PM      Failed - Cr in normal range and within 360 days    Creatinine, Ser  Date Value Ref Range Status  09/04/2022 0.66 (L) 0.76 - 1.27 mg/dL Final         Passed - K in normal range and within 360 days    Potassium  Date Value Ref Range Status  09/04/2022 4.5 3.5 - 5.2 mmol/L Final         Passed - Valid encounter within last 12 months    Recent Outpatient Visits           1 month ago Essential hypertension   [place] [facility], MD   8 months ago Annual physical exam   [place] [facility], MD   1 year ago Essential hypertension   [place] [facility], MD   1 year ago Essential hypertension   [place] [facility], Gildardo Pounds, NP   1 year ago Benign prostatic hyperplasia with nocturia   [place] [facility], Gildardo Pounds, NP       Future Appointments             Tomorrow Rema Fendt, NP [facility]             sacubitril-valsartan (ENTRESTO) 97-103 MG 180 tablet 1    Sig: Take 1 tablet by mouth 2 (two) times daily.     Off-Protocol Failed - 10/31/2022  2:12 PM      Failed -  Medication not assigned to a protocol, review manually.      Passed - Valid encounter within last 12 months    Recent Outpatient Visits           1 month ago Essential hypertension   [place] [facility] at [facility], MD   8 months ago Annual physical exam   [place] [facility], MD   1 year ago Essential hypertension   [place] [facility] at  [facility], MD   1 year ago Essential hypertension   [place] [facility], Gildardo Pounds, NP   1 year ago Benign prostatic hyperplasia with nocturia   [place] [facility], Gildardo Pounds, NP       Future Appointments             Tomorrow Rema Fendt, NP [place] [facility]             thiamine (VITAMIN B1) 100 MG tablet 100 tablet 0    Sig: Take 1 tablet (100 mg total) by mouth daily.     Off-Protocol Failed - 10/31/2022  2:12 PM      Failed - Medication not assigned to a protocol, review manually.      Passed - Valid encounter within last 12 months    Recent Outpatient Visits           1 month ago Essential hypertension   [place] [facility], MD   8 months ago Annual physical exam   [place] [facility] at [facility], MD   1 year ago Essential hypertension   [place] [facility] at [facility], MD   1 year ago Essential hypertension   [place] [facility] at [facility], Gildardo Pounds, NP   1 year ago Benign prostatic hyperplasia with nocturia    [facility] at [facility], Gildardo Pounds, NP       Future Appointments             Tomorrow Rema Fendt, NP  [facility] at [facility]             Vitamin D, Ergocalciferol, (DRISDOL) 1.25 MG (50000 UNIT) CAPS capsule 12 capsule 0    Sig: Take 1 capsule (50,000 Units total) by mouth every 7 (seven) days.     Endocrinology:  Vitamins - Vitamin D Supplementation 2 Failed - 10/31/2022  2:12 PM      Failed - Manual Review: Route requests for 50,000 IU strength to the provider      Failed - Ca in normal range and within 360 days    Calcium  Date Value Ref Range Status  09/04/2022 8.6 (L) 8.7 - 10.2 mg/dL Final   Calcium, Ion  Date Value Ref Range Status  08/11/2008 1.19 1.12 - 1.32 mmol/L Final          Failed - Vitamin D in normal range and within 360 days    Vit D, 25-Hydroxy  Date Value Ref Range Status  03/06/2022 10.9 (L) 30.0 - 100.0 ng/mL Final    Comment:    Vitamin D deficiency has been defined by the Institute of Medicine and an Endocrine Society practice guideline as a level of serum 25-OH vitamin D less than 20 ng/mL (1,2). The Endocrine Society went  on to further define vitamin D insufficiency as a level between 21 and 29 ng/mL (2). 1. IOM (Institute of Medicine). 2010. Dietary reference    intakes for calcium and D. [place]: The    [employer]. 2. Holick MF, Binkley Castine, Bischoff-Ferrari HA, et al.    Evaluation, treatment, and prevention of vitamin D    deficiency: an Endocrine Society clinical practice    guideline. JCEM. 2011 Jul; 96(7):1911-30.          Passed - Valid encounter within last 12 months    Recent Outpatient Visits           1 month ago Essential hypertension   [place] [facility], MD   8 months ago Annual physical exam   [place] [facility] at [facility], MD   1 year ago Essential hypertension   [place] [facility] at [name], MD   1 year ago Essential hypertension   [place] [facility], Gildardo Pounds, NP   1 year ago Benign prostatic hyperplasia with nocturia   [place] [facility] at [facility], Gildardo Pounds, NP       Future Appointments             Tomorrow Rema Fendt, NP [place] [facility]            Signed Prescriptions Disp Refills   spironolactone (ALDACTONE) 25 MG tablet 15 tablet 1    Sig: Take 1/2 tablet (12.5 mg total) by mouth daily.     Cardiovascular: Diuretics - Aldosterone Antagonist Failed - 10/31/2022  2:12 PM      Failed - Cr in normal range and within 180 days    Creatinine, Ser  Date Value Ref Range Status  09/04/2022 0.66 (L)  0.76 - 1.27 mg/dL Final         Failed - Last BP in normal range    BP Readings from Last 1 Encounters:  09/17/22 (!) 150/95         Passed - K in normal range and within 180 days    Potassium  Date Value Ref Range Status  09/04/2022 4.5 3.5 - 5.2 mmol/L Final         Passed - Na in normal range and within 180 days    Sodium  Date Value Ref Range Status  09/04/2022 139 134 - 144 mmol/L Final         Passed - eGFR is 30 or above and within 180 days    GFR calc Af Amer  Date Value Ref Range Status  11/05/2019 126 >59 mL/min/1.73 Final    Comment:    **[facility] currently reports eGFR in compliance with the current**   recommendations of the [employer]. [facility] will   update reporting as new guidelines are published from the NKF-ASN   Task force.    GFR, Estimated  Date Value Ref Range Status  02/18/2022 >60 >60 mL/min Final    Comment:    (NOTE) Calculated using the CKD-EPI Creatinine Equation (2021)    GFR  Date Value Ref Range Status  05/01/2022 108.81 >60.00 mL/min Final    Comment:    Calculated using the CKD-EPI Creatinine Equation (2021)   eGFR  Date Value Ref Range Status  09/04/2022 112 >59 mL/min/1.73 Final         Passed - Valid encounter within  last 6 months    Recent Outpatient Visits           1 month ago Essential hypertension   [place] [facility], MD   8 months ago Annual physical exam   [place] [facility], MD   1 year ago Essential hypertension   [place] [facility], MD   1 year ago Essential hypertension   [place] [facility], Gildardo Pounds, NP   1 year ago Benign prostatic hyperplasia with nocturia   [place] [facility] at [facility], Gildardo Pounds, NP       Future Appointments             Tomorrow Rema Fendt, NP [place] [facility]              tamsulosin [facility]) 0.4 MG CAPS capsule 30 capsule 0    Sig: Take 1 capsule (0.4 mg total) by mouth daily.     Urology: Alpha-Adrenergic Blocker Failed - 10/31/2022  2:12 PM      Failed - Last BP in normal range    BP Readings from Last 1 Encounters:  09/17/22 (!) 150/95         Passed - PSA in normal range and within 360 days    Prostate Specific Ag, Serum  Date Value Ref Range Status  03/06/2022 0.7 0.0 - 4.0 ng/mL Final    Comment:    Roche ECLIA methodology. According to the American Urological Association, Serum PSA should decrease and remain at undetectable levels after radical prostatectomy. The AUA defines biochemical recurrence as an initial PSA value 0.2 ng/mL or greater followed by a subsequent confirmatory PSA value 0.2 ng/mL or greater. Values obtained with different assay methods or kits cannot be used interchangeably. Results cannot be interpreted as absolute evidence of the presence or absence of malignant disease.          Passed - Valid encounter within last 12 months    Recent Outpatient Visits           1 month ago Essential hypertension   [place] [facility], MD   8 months ago Annual physical exam   [place] [facility], MD   1 year ago Essential hypertension   [place] [facility] at [facility], MD   1 year ago Essential hypertension   [place] [facility] at [facility], Gildardo Pounds, NP   1 year ago Benign prostatic hyperplasia with nocturia   [place] [facility], Gildardo Pounds, NP       Future Appointments             Tomorrow Rema Fendt, NP [facility]

## 2022-11-01 NOTE — Telephone Encounter (Signed)
Future visit tomorrow.  Requested Prescriptions  Pending Prescriptions Disp Refills   folic acid (FOLVITE) 1 MG tablet 30 tablet 2    Sig: Take 1 tablet (1 mg total) by mouth daily.     Endocrinology:  Vitamins Passed - 10/31/2022  2:12 PM      Passed - Valid encounter within last 12 months    Recent Outpatient Visits           1 month ago Essential hypertension   [place] [facility], Lauris Poag, MD   8 months ago Annual physical exam   [place] [facility], MD   1 year ago Essential hypertension   [place] [facility], MD   1 year ago Essential hypertension   [place] [facility], Gildardo Pounds, NP   1 year ago Benign prostatic hyperplasia with nocturia   [place] [facility], Gildardo Pounds, NP       Future Appointments             Tomorrow Rema Fendt, NP [place] [facility]             furosemide (LASIX) 20 MG tablet 30 tablet 0    Sig: Use 1 po daily prn for increased ankle swelling     Cardiovascular:  Diuretics - Loop Failed - 10/31/2022  2:12 PM      Failed - Ca in normal range and within 180 days    Calcium  Date Value Ref Range Status  09/04/2022 8.6 (L) 8.7 - 10.2 mg/dL Final   Calcium, Ion  Date Value Ref Range Status  08/11/2008 1.19 1.12 - 1.32 mmol/L Final         Failed - Cr in normal range and within 180 days    Creatinine, Ser  Date Value Ref Range Status  09/04/2022 0.66 (L) 0.76 - 1.27 mg/dL Final         Failed - Mg Level in normal range and within 180 days    Magnesium  Date Value Ref Range Status  02/18/2022 1.8 1.7 - 2.4 mg/dL Final    Comment:    Performed at [facility], [address] [place], [place] [address]         Failed - Last BP in normal range    BP Readings from Last 1 Encounters:  09/17/22 (!) 150/95         Passed - K in  normal range and within 180 days    Potassium  Date Value Ref Range Status  09/04/2022 4.5 3.5 - 5.2 mmol/L Final         Passed - Na in normal range and within 180 days    Sodium  Date Value Ref Range Status  09/04/2022 139 134 - 144 mmol/L Final         Passed - Cl in normal range and within 180 days    Chloride  Date Value Ref Range Status  09/04/2022 96 96 - 106 mmol/L Final         Passed - Valid encounter within last 6 months    Recent Outpatient Visits           1 month ago Essential hypertension   [place] [facility], MD   8 months ago Annual physical exam   [facility] MD   1 year ago Essential hypertension   [place] [facility], MD   1 year ago Essential hypertension   [place] [facility], Gildardo Pounds, NP   1 year ago Benign prostatic hyperplasia with nocturia   [place] [facility], Gildardo Pounds, NP       Future Appointments             Tomorrow Rema Fendt, NP [place] [facility]             Multiple Vitamin (MULTIVITAMIN WITH MINERALS) TABS tablet      Sig: Take 1 tablet by mouth daily.     There is no refill protocol information for this order     potassium chloride SA (KLOR-CON M) 20 MEQ tablet 10 tablet 0    Sig: Take 1 tablet (20 mEq total) by mouth once daily     Endocrinology:  Minerals - Potassium Supplementation Failed - 10/31/2022  2:12 PM      Failed - Cr in normal range and within 360 days    Creatinine, Ser  Date Value Ref Range Status  09/04/2022 0.66 (L) 0.76 - 1.27 mg/dL Final         Passed - K in normal range and within 360 days    Potassium  Date Value Ref Range Status  09/04/2022 4.5 3.5 - 5.2 mmol/L Final         Passed - Valid encounter within last 12 months    Recent Outpatient Visits           1 month ago  Essential hypertension   [place] [facility], MD   8 months ago Annual physical exam   [place] [facility], MD   1 year ago Essential hypertension   [place] [facility], MD   1 year ago Essential hypertension   [place] [facility], Gildardo Pounds, NP   1 year ago Benign prostatic hyperplasia with nocturia   [place] [facility], Gildardo Pounds, NP       Future Appointments             Tomorrow Rema Fendt, NP [facility]             sacubitril-valsartan (ENTRESTO) 97-103 MG 180 tablet 1    Sig: Take 1 tablet by mouth 2 (two) times daily.     Off-Protocol Failed - 10/31/2022  2:12 PM      Failed - Medication not assigned to a protocol, review manually.      Passed - Valid encounter within last 12 months    Recent Outpatient Visits           1 month ago Essential hypertension   [place] [facility], MD   8 months ago Annual physical exam   [place] [facility], MD   1 year ago Essential hypertension   [place] [facility], MD   1 year ago Essential hypertension    [facility] at [facility], Gildardo Pounds, NP   1 year ago Benign prostatic hyperplasia with nocturia  [facility], Gildardo Pounds, NP       Future Appointments             Tomorrow Rema Fendt, NP [place] [facility]             spironolactone (ALDACTONE) 25 MG tablet 15 tablet 1    Sig: Take 1/2 tablet (12.5 mg total) by mouth daily.     Cardiovascular: Diuretics - Aldosterone Antagonist Failed - 10/31/2022  2:12 PM      Failed - Cr in normal range and within 180 days    Creatinine, Ser  Date Value Ref Range Status   09/04/2022 0.66 (L) 0.76 - 1.27 mg/dL Final         Failed - Last BP in normal range    BP Readings from Last 1 Encounters:  09/17/22 (!) 150/95         Passed - K in normal range and within 180 days    Potassium  Date Value Ref Range Status  09/04/2022 4.5 3.5 - 5.2 mmol/L Final         Passed - Na in normal range and within 180 days    Sodium  Date Value Ref Range Status  09/04/2022 139 134 - 144 mmol/L Final         Passed - eGFR is 30 or above and within 180 days    GFR calc Af Amer  Date Value Ref Range Status  11/05/2019 126 >59 mL/min/1.73 Final    Comment:    **[facility] currently reports eGFR in compliance with the current**   recommendations of the [employer]. [facility] will   update reporting as new guidelines are published from the NKF-ASN   Task force.    GFR, Estimated  Date Value Ref Range Status  02/18/2022 >60 >60 mL/min Final    Comment:    (NOTE) Calculated using the CKD-EPI Creatinine Equation (2021)    GFR  Date Value Ref Range Status  05/01/2022 108.81 >60.00 mL/min Final    Comment:    Calculated using the CKD-EPI Creatinine Equation (2021)   eGFR  Date Value Ref Range Status  09/04/2022 112 >59 mL/min/1.73 Final         Passed - Valid encounter within last 6 months    Recent Outpatient Visits           1 month ago Essential hypertension   [place] [facility], MD   8 months ago Annual physical exam   [place] [facility], MD   1 year ago Essential hypertension   [place] [facility], MD   1 year ago Essential hypertension   [place] [facility], Gildardo Pounds, NP   1 year ago Benign prostatic hyperplasia with nocturia   [place] [facility] at [facility], Gildardo Pounds, NP       Future Appointments             Tomorrow Rema Fendt, NP [place] [facility]             tamsulosin [facility]) 0.4 MG CAPS capsule 30 capsule 0    Sig: Take 1 capsule (0.4 mg total) by mouth daily.     Urology: Alpha-Adrenergic Blocker Failed - 10/31/2022  2:12 PM  Failed - Last BP in normal range    BP Readings from Last 1 Encounters:  09/17/22 (!) 150/95         Passed - PSA in normal range and within 360 days    Prostate Specific Ag, Serum  Date Value Ref Range Status  03/06/2022 0.7 0.0 - 4.0 ng/mL Final    Comment:    Roche ECLIA methodology. According to the American Urological Association, Serum PSA should decrease and remain at undetectable levels after radical prostatectomy. The AUA defines biochemical recurrence as an initial PSA value 0.2 ng/mL or greater followed by a subsequent confirmatory PSA value 0.2 ng/mL or greater. Values obtained with different assay methods or kits cannot be used interchangeably. Results cannot be interpreted as absolute evidence of the presence or absence of malignant disease.          Passed - Valid encounter within last 12 months    Recent Outpatient Visits           1 month ago Essential hypertension   [place] [facility], MD   8 months ago Annual physical exam   [place] [facility], MD   1 year ago Essential hypertension   [place] [facility], [facility]   1 year ago Essential hypertension   [place] [facility], Gildardo Pounds, NP   1 year ago Benign prostatic hyperplasia with nocturia   [place] [facility], Gildardo Pounds, NP       Future Appointments             Tomorrow Rema Fendt, NP [place] [facility]             thiamine (VITAMIN B1) 100 MG tablet 100 tablet 0    Sig: Take 1 tablet (100 mg total) by mouth daily.     Off-Protocol Failed - 10/31/2022  2:12 PM      Failed - Medication  not assigned to a protocol, review manually.      Passed - Valid encounter within last 12 months    Recent Outpatient Visits           1 month ago Essential hypertension   [place] [facility], MD   8 months ago Annual physical exam   [place] [facility], MD   1 year ago Essential hypertension   [place] [facility], MD   1 year ago Essential hypertension   [place] [facility], Gildardo Pounds, NP   1 year ago Benign prostatic hyperplasia with nocturia   [place] [facility], Gildardo Pounds, NP       Future Appointments             Tomorrow Rema Fendt, NP [place] [facility]             Vitamin D, Ergocalciferol, (DRISDOL) 1.25 MG (50000 UNIT) CAPS capsule 12 capsule 0    Sig: Take 1 capsule (50,000 Units total) by mouth every 7 (seven) days.     Endocrinology:  Vitamins - Vitamin D Supplementation 2 Failed - 10/31/2022  2:12 PM      Failed - Manual Review: Route requests for 50,000 IU strength to the  provider      Failed - Ca in normal range and within 360 days    Calcium  Date Value Ref Range Status  09/04/2022 8.6 (L) 8.7 - 10.2 mg/dL Final   Calcium, Ion  Date Value Ref Range Status  08/11/2008 1.19 1.12 - 1.32 mmol/L Final         Failed - Vitamin D in normal range and within 360 days    Vit D, 25-Hydroxy  Date Value Ref Range Status  03/06/2022 10.9 (L) 30.0 - 100.0 ng/mL Final    Comment:    Vitamin D deficiency has been defined by the Institute of Medicine and an Endocrine Society practice guideline as a level of serum 25-OH vitamin D less than 20 ng/mL (1,2). The Endocrine Society went on to further define vitamin D insufficiency as a level between 21 and 29 ng/mL (2). 1. IOM (Institute of Medicine). 2010. Dietary reference    intakes for calcium and D. [place]:  The    [employer]. 2. Holick MF, Binkley [place], Bischoff-Ferrari HA, et al.    Evaluation, treatment, and prevention of vitamin D    deficiency: an Endocrine Society clinical practice    guideline. JCEM. 2011 Jul; 96(7):1911-30.          Passed - Valid encounter within last 12 months    Recent Outpatient Visits           1 month ago Essential hypertension   [place] [facility], MD   8 months ago Annual physical exam   [place] [facility], MD   1 year ago Essential hypertension   [place] [facility] at [facility], MD   1 year ago Essential hypertension   [place] [facility], Gildardo Pounds, NP   1 year ago Benign prostatic hyperplasia with nocturia   [place] [facility], Gildardo Pounds, NP       Future Appointments             Tomorrow Rema Fendt, NP [facility]

## 2022-11-02 ENCOUNTER — Encounter: Payer: Self-pay | Admitting: Family

## 2022-11-02 ENCOUNTER — Ambulatory Visit: Payer: Medicaid Other | Admitting: Family

## 2022-11-02 VITALS — BP 117/69 | HR 99 | Temp 98.3°F | Ht 77.0 in | Wt 210.0 lb

## 2022-11-02 DIAGNOSIS — N400 Enlarged prostate without lower urinary tract symptoms: Secondary | ICD-10-CM

## 2022-11-02 DIAGNOSIS — I5022 Chronic systolic (congestive) heart failure: Secondary | ICD-10-CM | POA: Diagnosis not present

## 2022-11-02 DIAGNOSIS — M25472 Effusion, left ankle: Secondary | ICD-10-CM

## 2022-11-02 DIAGNOSIS — K219 Gastro-esophageal reflux disease without esophagitis: Secondary | ICD-10-CM

## 2022-11-02 DIAGNOSIS — I10 Essential (primary) hypertension: Secondary | ICD-10-CM

## 2022-11-02 MED ORDER — OMEPRAZOLE 20 MG PO CPDR
20.0000 mg | DELAYED_RELEASE_CAPSULE | Freq: Every day | ORAL | 0 refills | Status: DC
Start: 2022-11-02 — End: 2023-02-22

## 2022-11-02 MED ORDER — FUROSEMIDE 20 MG PO TABS
ORAL_TABLET | ORAL | 0 refills | Status: DC
Start: 2022-11-02 — End: 2023-01-10

## 2022-11-02 MED ORDER — SPIRONOLACTONE 25 MG PO TABS: 12.5000 mg | ORAL_TABLET | Freq: Every day | ORAL | 2 refills | Status: DC

## 2022-11-02 MED ORDER — ENTRESTO 97-103 MG PO TABS
1.0000 | ORAL_TABLET | Freq: Two times a day (BID) | ORAL | 0 refills | Status: DC
Start: 2022-11-02 — End: 2023-12-03

## 2022-11-02 MED ORDER — CARVEDILOL 12.5 MG PO TABS
12.5000 mg | ORAL_TABLET | Freq: Two times a day (BID) | ORAL | 2 refills | Status: DC
Start: 2022-11-02 — End: 2023-12-18

## 2022-11-02 MED ORDER — TAMSULOSIN HCL 0.4 MG PO CAPS
0.4000 mg | ORAL_CAPSULE | Freq: Every day | ORAL | 2 refills | Status: DC
Start: 2022-11-02 — End: 2023-03-06

## 2022-11-02 NOTE — Progress Notes (Signed)
PT for med refill  PT left ankle swelling  PT right elbow tender pain

## 2022-11-09 ENCOUNTER — Encounter: Payer: Self-pay | Admitting: Orthopaedic Surgery

## 2022-11-09 ENCOUNTER — Ambulatory Visit (INDEPENDENT_AMBULATORY_CARE_PROVIDER_SITE_OTHER): Payer: Medicaid Other | Admitting: Orthopaedic Surgery

## 2022-11-09 ENCOUNTER — Other Ambulatory Visit (INDEPENDENT_AMBULATORY_CARE_PROVIDER_SITE_OTHER): Payer: Medicaid Other

## 2022-11-09 VITALS — BP 110/72 | HR 92 | Ht 77.0 in | Wt 210.0 lb

## 2022-11-09 DIAGNOSIS — M25572 Pain in left ankle and joints of left foot: Secondary | ICD-10-CM | POA: Diagnosis not present

## 2022-11-09 LAB — URIC ACID: Uric Acid, Serum: 7.3 mg/dL (ref 4.0–8.0)

## 2022-11-09 NOTE — Progress Notes (Signed)
Office Visit Note   Patient: [name] [name]           Date of Birth: 08-02-1970           MRN: [id number] Visit Date: 11/09/2022              Requested by: Rema Fendt, NP [address] [place],  [place] [address] PCP: Georganna Skeans, MD   Assessment & Plan: Visit Diagnoses:  1. Pain in left ankle and joints of left foot     Plan: Will start with uric acid I will check him back in 3 weeks and I will call him with the uric acid results of his uric acid is elevated he needs to be started on allopurinol for chronic gout.  Plan Long discussion pathophysiology of gout problems with crystals in the joint and soft tissue.  Rationale for treatment discussed.  He can continue some Aleve 2 p.o. twice daily.  Uric acid was drawn today I will call him next week with results.  Follow-Up Instructions: Return in about 3 weeks (around 11/30/2022).   Orders:  Orders Placed This Encounter  Procedures   XR Ankle Complete Left   Uric acid   No orders of the defined types were placed in this encounter.     Procedures: No procedures performed   Clinical Data: No additional findings.   Subjective: Chief Complaint  Patient presents with   Left Ankle - Pain    HPI 52 year old male new patient visit with complaints of recurrent right olecranon bursitis, left Achilles tendon thickening, swelling and pain.  Metatarsal phalangeal joint arthritic conditions history of knee pain problems.  Additionally patient has significant history of EtOH marijuana abuse, EtOH cirrhosis, pancreatitis, heart failure and also history of gout.  He said elbow aspirated a few times recently with recurrence of fluid.  He has thickening over the left Achilles tendon but is able to walk and actually can raise up on his toes with some discomfort.  No diabetes.  Renal functions are normal.  No uric acid in chart.  Review of Systems positive for cocaine abuse, cirrhosis of the liver alcohol abuse,, heart  failure not currently symptomatic.  All systems noncontributory to HPI.   Objective: Vital Signs: BP 110/72   Pulse 92   Ht 6\' 5"  (1.956 m)   Wt 210 lb (95.3 kg)   BMI 24.90 kg/m   Physical Exam Constitutional:      Appearance: He is well-developed.  HENT:     Head: Normocephalic and atraumatic.     Right Ear: External ear normal.     Left Ear: External ear normal.  Eyes:     Pupils: Pupils are equal, round, and reactive to light.  Neck:     Thyroid: No thyromegaly.     Trachea: No tracheal deviation.  Cardiovascular:     Rate and Rhythm: Normal rate.  Pulmonary:     Effort: Pulmonary effort is normal.     Breath sounds: No wheezing.  Abdominal:     General: Bowel sounds are normal.     Palpations: Abdomen is soft.  Musculoskeletal:     Cervical back: Neck supple.  Skin:    General: Skin is warm and dry.     Capillary Refill: Capillary refill takes less than 2 seconds.  Neurological:     Mental Status: He is alert and oriented to person, place, and time.  Psychiatric:        Behavior: Behavior normal.  Thought Content: Thought content normal.        Judgment: Judgment normal.     Ortho Exam patient with slightly bigger than a golf ball size olecranon bursal sac right elbow slight increased warmth.  He is get some crepitus with left shoulder range of motion tenderness with acromioclavicular joint.  Bilateral knee crepitus.  Significant bunion type deformity with first MTP unilateral subluxation.  Thickening overlying the left Achilles tendon.  Slight thickening opposite right Achilles tendon.  Thickening is subcutaneous tissue and not the typical fusiform tendon swelling that would be expected with partial tendon tear.  Specialty Comments:  No specialty comments available.  Imaging: XR Ankle Complete Left  Result Date: 11/09/2022 Three-view x-rays left ankle obtained and reviewed.  Ankle joint is normal.  Soft tissue swelling which appears to be over the top  of the Achilles tendon and subtendinous tissue. Impression: Normal right ankle joint.  Soft tissue swelling overlying the Achilles tendon.    PMFS History: Patient Active Problem List   Diagnosis Date Noted   Alcohol use disorder 05/02/2022   Family history of colon cancer 05/02/2022   History of colon polyps 05/02/2022   Gastritis and gastroduodenitis    Hiatal hernia    Acute pancreatitis 02/16/2022   Hematemesis with nausea    Thrombocytopenia (HCC)    Cirrhosis of liver (HCC) 02/15/2022   Elevated LFTs 02/15/2022   Hyponatremia 02/15/2022   Lesion of lumbar spine 02/15/2022   Duodenitis 02/14/2022   Encounter for orthopedic follow-up care 07/25/2020   Ganglion cyst 06/22/2020   Benign prostatic hyperplasia with nocturia 11/09/2019   Chronic systolic CHF (congestive heart failure) (HCC) 01/23/2017   Non-ischemic cardiomyopathy (HCC) 09/19/2016   HLD (hyperlipidemia) 09/19/2016   ETOH abuse 09/19/2016   Moderate mitral regurgitation 09/05/2016   Cocaine abuse (HCC) 08/23/2016   Positive D dimer 08/23/2016   Hypokalemia 08/23/2016   Macrocytosis 08/23/2016   Alcohol use 08/20/2016   Major depressive disorder, 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   Essential hypertension 04/20/2008   Past Medical History:  Diagnosis Date   COLONIC POLYPS, BENIGN 01/20/2010   Annotation: 12/2009: path benign; rpt 2016 Qualifier: Diagnosis of  By: Brynda Rim     Hypertension     Family History  Problem Relation Age of Onset   Colon cancer Brother    Diabetes Other    Hypertension Other    Esophageal cancer Neg Hx    Inflammatory bowel disease Neg Hx    Liver disease Neg Hx    Pancreatic cancer Neg Hx    Gastric cancer Neg Hx    Rectal cancer Neg Hx    Stomach cancer Neg Hx     Past Surgical History:  Procedure Laterality Date   BIOPSY  02/18/2022   Procedure: BIOPSY;  Surgeon: Shellia Cleverly, DO;  Location:  [facility];  Service: Gastroenterology;;   ESOPHAGOGASTRODUODENOSCOPY N/A 02/18/2022   Procedure: ESOPHAGOGASTRODUODENOSCOPY (EGD);  Surgeon: Shellia Cleverly, DO;  Location: [facility];  Service: Gastroenterology;  Laterality: N/A;   POLYPECTOMY     RIGHT/LEFT HEART CATH AND CORONARY ANGIOGRAPHY N/A 08/27/2016   Procedure: Right/Left Heart Cath and Coronary Angiography;  Surgeon: Tonny Bollman, MD;  Location: [facility] INVASIVE CV LAB;  Service: Cardiovascular;  Laterality: N/A;   Social History   Occupational History   Not on file  Tobacco Use   Smoking status: Never   Smokeless tobacco: Never  Substance and Sexual Activity   Alcohol  use: Yes    Comment: 3-4 Beers Daily   Drug use: Yes    Types: Marijuana    Comment: pt reports marijuana use occasionally   Sexual activity: Yes    Birth control/protection: None

## 2022-11-13 ENCOUNTER — Other Ambulatory Visit: Payer: Self-pay | Admitting: Orthopaedic Surgery

## 2022-11-13 MED ORDER — ALLOPURINOL 100 MG PO TABS: ORAL_TABLET | ORAL | 0 refills | Status: AC

## 2022-11-27 ENCOUNTER — Ambulatory Visit (INDEPENDENT_AMBULATORY_CARE_PROVIDER_SITE_OTHER): Payer: Medicaid Other | Admitting: Orthopaedic Surgery

## 2022-11-27 VITALS — BP 134/89 | HR 101

## 2022-11-27 DIAGNOSIS — M1009 Idiopathic gout, multiple sites: Secondary | ICD-10-CM | POA: Diagnosis not present

## 2022-11-27 NOTE — Progress Notes (Signed)
Office Visit Note   Patient: [name] [name]           Date of Birth: December 09, 1970           MRN: [id number] Visit Date: 11/27/2022              Requested by: Georganna Skeans, MD [address] [place],  [place] [address] PCP: Georganna Skeans, MD   Assessment & Plan: Visit Diagnoses:  1. Idiopathic gout of multiple sites, unspecified chronicity     Plan: Continue allopurinol 300 mg daily I sent in several refills.  He can follow-up with his PCP in next time he gets regular labs he can have his uric acid repeated to make sure it is in normal range.  He has gotten good improvement of multiple joint complaints taking the allopurinol.  Follow-Up Instructions: No follow-ups on file.   Orders:  No orders of the defined types were placed in this encounter.  No orders of the defined types were placed in this encounter.     Procedures: No procedures performed   Clinical Data: No additional findings.   Subjective: Chief Complaint  Patient presents with   Left Ankle - Follow-up    HPI 52 year old male returns for follow-up.  He is on allopurinol 300 mg daily and states that his ankles his knee his elbow and his left foot feel much better.  He will continue allopurinol 300 mg daily and when he follows up with his primary care physician he can get a repeat uric acid just to make sure it is return back to normal.  Review of Systems updated unchanged.  History of heart failure is occasionally taking the Lasix history of cirrhosis alcohol induced, marijuana abuse as well as gout.   Objective: Vital Signs: BP 134/89   Pulse (!) 101   Physical Exam Constitutional:      Appearance: He is well-developed.  HENT:     Head: Normocephalic and atraumatic.     Right Ear: External ear normal.     Left Ear: External ear normal.  Eyes:     Pupils: Pupils are equal, round, and reactive to light.  Neck:     Thyroid: No thyromegaly.     Trachea: No tracheal deviation.   Cardiovascular:     Rate and Rhythm: Normal rate.  Pulmonary:     Effort: Pulmonary effort is normal.     Breath sounds: No wheezing.  Abdominal:     General: Bowel sounds are normal.     Palpations: Abdomen is soft.  Musculoskeletal:     Cervical back: Neck supple.  Skin:    General: Skin is warm and dry.     Capillary Refill: Capillary refill takes less than 2 seconds.  Neurological:     Mental Status: He is alert and oriented to person, place, and time.  Psychiatric:        Behavior: Behavior normal.        Thought Content: Thought content normal.        Judgment: Judgment normal.     Ortho Exam patient gets from sitting standing no active synovitis noted upper or lower extremities.  Still using a sleeve over his right elbow.  Good ankle range of motion.  He is amatory without limping and can walk rapidly.  Specialty Comments:  No specialty comments available.  Imaging: No results found.   PMFS History: Patient Active Problem List   Diagnosis Date Noted   Idiopathic gout of multiple  sites 11/27/2022   Alcohol use disorder 05/02/2022   Family history of colon cancer 05/02/2022   History of colon polyps 05/02/2022   Gastritis and gastroduodenitis    Hiatal hernia    Acute pancreatitis 02/16/2022   Hematemesis with nausea    Thrombocytopenia (HCC)    Cirrhosis of liver (HCC) 02/15/2022   Elevated LFTs 02/15/2022   Hyponatremia 02/15/2022   Lesion of lumbar spine 02/15/2022   Duodenitis 02/14/2022   Encounter for orthopedic follow-up care 07/25/2020   Ganglion cyst 06/22/2020   Benign prostatic hyperplasia with nocturia 11/09/2019   Chronic systolic CHF (congestive heart failure) (HCC) 01/23/2017   Non-ischemic cardiomyopathy (HCC) 09/19/2016   HLD (hyperlipidemia) 09/19/2016   ETOH abuse 09/19/2016   Moderate mitral regurgitation 09/05/2016   Cocaine abuse (HCC) 08/23/2016   Positive D dimer 08/23/2016   Hypokalemia 08/23/2016   Macrocytosis 08/23/2016    Alcohol use 08/20/2016   Major depressive disorder, 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   Essential hypertension 04/20/2008   Past Medical History:  Diagnosis Date   COLONIC POLYPS, BENIGN 01/20/2010   Annotation: 12/2009: path benign; rpt 2016 Qualifier: Diagnosis of  By: Brynda Rim     Hypertension     Family History  Problem Relation Age of Onset   Colon cancer Brother    Diabetes Other    Hypertension Other    Esophageal cancer Neg Hx    Inflammatory bowel disease Neg Hx    Liver disease Neg Hx    Pancreatic cancer Neg Hx    Gastric cancer Neg Hx    Rectal cancer Neg Hx    Stomach cancer Neg Hx     Past Surgical History:  Procedure Laterality Date   BIOPSY  02/18/2022   Procedure: BIOPSY;  Surgeon: Shellia Cleverly, DO;  Location: [facility];  Service: Gastroenterology;;   ESOPHAGOGASTRODUODENOSCOPY N/A 02/18/2022   Procedure: ESOPHAGOGASTRODUODENOSCOPY (EGD);  Surgeon: Shellia Cleverly, DO;  Location: [facility];  Service: Gastroenterology;  Laterality: N/A;   POLYPECTOMY     RIGHT/LEFT HEART CATH AND CORONARY ANGIOGRAPHY N/A 08/27/2016   Procedure: Right/Left Heart Cath and Coronary Angiography;  Surgeon: Tonny Bollman, MD;  Location: [facility] INVASIVE CV LAB;  Service: Cardiovascular;  Laterality: N/A;   Social History   Occupational History   Not on file  Tobacco Use   Smoking status: Never   Smokeless tobacco: Never  Substance and Sexual Activity   Alcohol use: Yes    Comment: 3-4 Beers Daily   Drug use: Yes    Types: Marijuana    Comment: pt reports marijuana use occasionally   Sexual activity: Yes    Birth control/protection: None

## 2022-12-06 ENCOUNTER — Ambulatory Visit: Payer: Medicaid Other | Admitting: Family Medicine

## 2023-01-10 ENCOUNTER — Other Ambulatory Visit: Payer: Self-pay

## 2023-01-10 ENCOUNTER — Other Ambulatory Visit: Payer: Self-pay | Admitting: Family

## 2023-01-10 DIAGNOSIS — M25472 Effusion, left ankle: Secondary | ICD-10-CM

## 2023-01-10 MED ORDER — FUROSEMIDE 20 MG PO TABS
ORAL_TABLET | ORAL | 0 refills | Status: DC
Start: 2023-01-10 — End: 2023-12-18

## 2023-02-22 ENCOUNTER — Other Ambulatory Visit: Payer: Self-pay | Admitting: Family

## 2023-02-22 DIAGNOSIS — K219 Gastro-esophageal reflux disease without esophagitis: Secondary | ICD-10-CM

## 2023-03-01 ENCOUNTER — Other Ambulatory Visit: Payer: Self-pay | Admitting: Family

## 2023-03-01 DIAGNOSIS — M25472 Effusion, left ankle: Secondary | ICD-10-CM

## 2023-03-01 NOTE — Telephone Encounter (Signed)
Requested Prescriptions  Pending Prescriptions Disp Refills   furosemide (LASIX) 20 MG tablet [Pharmacy Med Name: furosemide 20 mg tablet] 90 tablet 0    Sig: TAKE 1 TABLET BY MOUTH DAILY AS NEEDED FOR INCREASED ANKLE SWELLING     Cardiovascular:  Diuretics - Loop Failed - 03/01/2023  1:09 PM      Failed - Ca in normal range and within 180 days    Calcium  Date Value Ref Range Status  09/04/2022 8.6 (L) 8.7 - 10.2 mg/dL Final   Calcium, Ion  Date Value Ref Range Status  08/11/2008 1.19 1.12 - 1.32 mmol/L Final         Failed - Cr in normal range and within 180 days    Creatinine, Ser  Date Value Ref Range Status  09/04/2022 0.66 (L) 0.76 - 1.27 mg/dL Final         Failed - Mg Level in normal range and within 180 days    Magnesium  Date Value Ref Range Status  02/18/2022 1.8 1.7 - 2.4 mg/dL Final    Comment:    Performed at [facility], [address], [place], [place] [address]         Passed - K in normal range and within 180 days    Potassium  Date Value Ref Range Status  09/04/2022 4.5 3.5 - 5.2 mmol/L Final         Passed - Na in normal range and within 180 days    Sodium  Date Value Ref Range Status  09/04/2022 139 134 - 144 mmol/L Final         Passed - Cl in normal range and within 180 days    Chloride  Date Value Ref Range Status  09/04/2022 96 96 - 106 mmol/L Final         Passed - Last BP in normal range    BP Readings from Last 1 Encounters:  11/27/22 134/89         Passed - Valid encounter within last 6 months    Recent Outpatient Visits           3 months ago Essential hypertension   [facility], Amy J, NP   5 months ago Essential hypertension   [facility], MD   12 months ago Annual physical exam   [facility], MD   1 year ago Essential hypertension   [facility], MD   1 year ago Essential hypertension   [facility], Gildardo Pounds, NP

## 2023-03-06 ENCOUNTER — Other Ambulatory Visit: Payer: Self-pay | Admitting: Family

## 2023-03-06 ENCOUNTER — Other Ambulatory Visit: Payer: Self-pay

## 2023-03-06 DIAGNOSIS — N400 Enlarged prostate without lower urinary tract symptoms: Secondary | ICD-10-CM

## 2023-03-06 MED ORDER — TAMSULOSIN HCL 0.4 MG PO CAPS
0.4000 mg | ORAL_CAPSULE | Freq: Every day | ORAL | 2 refills | Status: DC
Start: 2023-03-06 — End: 2024-04-10

## 2023-04-01 ENCOUNTER — Telehealth: Payer: Self-pay | Admitting: Radiology

## 2023-04-01 NOTE — Telephone Encounter (Signed)
Received fax request for refill on Allopurinol 100mg  tablet Take one tablet by mouth daily x one week, then two every morning x one week, then three tablets daily #[id number] Last filled 11/13/2022   Please advise.

## 2023-04-02 MED ORDER — ALLOPURINOL 300 MG PO TABS: 300.0000 mg | ORAL_TABLET | Freq: Every day | ORAL | 6 refills | Status: AC

## 2023-04-02 NOTE — Telephone Encounter (Signed)
Sent to pharmacy 

## 2023-04-02 NOTE — Addendum Note (Signed)
Addended by: Rogers Seeds on: 04/02/2023 08:31 AM   Modules accepted: Orders

## 2023-05-06 ENCOUNTER — Other Ambulatory Visit: Payer: Self-pay | Admitting: Family

## 2023-05-06 DIAGNOSIS — I10 Essential (primary) hypertension: Secondary | ICD-10-CM

## 2023-05-13 ENCOUNTER — Other Ambulatory Visit: Payer: Self-pay | Admitting: Family

## 2023-05-13 DIAGNOSIS — K219 Gastro-esophageal reflux disease without esophagitis: Secondary | ICD-10-CM

## 2023-11-14 ENCOUNTER — Other Ambulatory Visit: Payer: Self-pay | Admitting: Family

## 2023-11-14 DIAGNOSIS — N400 Enlarged prostate without lower urinary tract symptoms: Secondary | ICD-10-CM

## 2023-11-14 DIAGNOSIS — K219 Gastro-esophageal reflux disease without esophagitis: Secondary | ICD-10-CM

## 2023-12-03 ENCOUNTER — Ambulatory Visit (INDEPENDENT_AMBULATORY_CARE_PROVIDER_SITE_OTHER): Admitting: Family Medicine

## 2023-12-03 ENCOUNTER — Encounter: Payer: Self-pay | Admitting: Family Medicine

## 2023-12-03 VITALS — BP 163/102 | HR 95 | Ht 77.0 in | Wt 210.0 lb

## 2023-12-03 DIAGNOSIS — I5022 Chronic systolic (congestive) heart failure: Secondary | ICD-10-CM | POA: Diagnosis not present

## 2023-12-03 DIAGNOSIS — M25472 Effusion, left ankle: Secondary | ICD-10-CM

## 2023-12-03 DIAGNOSIS — I10 Essential (primary) hypertension: Secondary | ICD-10-CM

## 2023-12-03 DIAGNOSIS — N400 Enlarged prostate without lower urinary tract symptoms: Secondary | ICD-10-CM

## 2023-12-03 DIAGNOSIS — K219 Gastro-esophageal reflux disease without esophagitis: Secondary | ICD-10-CM

## 2023-12-03 MED ORDER — TAMSULOSIN HCL 0.4 MG PO CAPS: 0.4000 mg | ORAL_CAPSULE | Freq: Every day | ORAL | 2 refills | Status: AC

## 2023-12-03 MED ORDER — SACUBITRIL-VALSARTAN 97-103 MG PO TABS: 1.0000 | ORAL_TABLET | Freq: Two times a day (BID) | ORAL | 0 refills | Status: DC

## 2023-12-03 MED ORDER — CARVEDILOL 25 MG PO TABS: 25.0000 mg | ORAL_TABLET | Freq: Two times a day (BID) | ORAL | 3 refills | Status: DC

## 2023-12-03 MED ORDER — SPIRONOLACTONE 50 MG PO TABS: 50.0000 mg | ORAL_TABLET | Freq: Every day | ORAL | 0 refills | Status: DC

## 2023-12-03 MED ORDER — FUROSEMIDE 20 MG PO TABS
ORAL_TABLET | ORAL | 0 refills | Status: DC
Start: 2023-12-03 — End: 2023-12-18

## 2023-12-03 MED ORDER — OMEPRAZOLE 20 MG PO CPDR: DELAYED_RELEASE_CAPSULE | ORAL | 0 refills | Status: DC

## 2023-12-04 ENCOUNTER — Encounter: Payer: Self-pay | Admitting: Family Medicine

## 2023-12-04 NOTE — Progress Notes (Signed)
 Established Patient Office Visit  Subjective    Patient ID: [name] [name], male    DOB: 16-May-1971  Age: 53 y.o. MRN: [id number]  CC:  Chief Complaint  Patient presents with   Medical Management of Chronic Issues    Medication refills    HPI [name] [name] presents for routine follow up of  chronic med issues including hypertension and GERD.  Patient reports that he has been out of his blood pressure meds for a couple of days. Patient denies acute complaints.   Outpatient Encounter Medications as of 12/03/2023  Medication Sig   allopurinol  (ZYLOPRIM ) 100 MG tablet Take one daily for 1 week then 2 tablets each morning for one week then take 3 tablets daily.   allopurinol  (ZYLOPRIM ) 300 MG tablet Take 1 tablet (300 mg total) by mouth daily.   carvedilol  (COREG ) 12.5 MG tablet Take 1 tablet (12.5 mg total) by mouth 2 (two) times daily.   carvedilol  (COREG ) 25 MG tablet Take 1 tablet (25 mg total) by mouth 2 (two) times daily with a meal.   folic acid  (FOLVITE ) 1 MG tablet Take 1 tablet (1 mg total) by mouth daily.   furosemide  (LASIX ) 20 MG tablet Use 1 po daily prn for increased ankle swelling   Multiple Vitamin (MULTIVITAMIN WITH MINERALS) TABS tablet Take 1 tablet by mouth daily.   omeprazole  (PRILOSEC ) 20 MG capsule TAKE 1 CAPSULE BY MOUTH DAILY   spironolactone  (ALDACTONE ) 25 MG tablet Take 1/2 tablet (12.5 mg total) by mouth daily.   spironolactone  (ALDACTONE ) 50 MG tablet Take 1 tablet (50 mg total) by mouth daily.   tamsulosin  (FLOMAX ) 0.4 MG CAPS capsule Take 1 capsule (0.4 mg total) by mouth daily.   thiamine  (VITAMIN B1) 100 MG tablet Take 1 tablet (100 mg total) by mouth daily.   Vitamin D , Ergocalciferol , (DRISDOL ) 1.25 MG (50000 UNIT) CAPS capsule Take 1 capsule (50,000 Units total) by mouth every 7 (seven) days.   [DISCONTINUED] furosemide  (LASIX ) 20 MG tablet TAKE 1 TABLET BY MOUTH DAILY AS NEEDED FOR INCREASED ANKLE SWELLING   [DISCONTINUED] omeprazole  (PRILOSEC ) 20  MG capsule Take 1 capsule (20 mg total) by mouth 2 (two) times daily for 42 days, THEN 1 capsule (20 mg total) daily   [DISCONTINUED] sacubitril -valsartan  (ENTRESTO ) 97-103 MG Take 1 tablet by mouth 2 (two) times daily.   [DISCONTINUED] tamsulosin  (FLOMAX ) 0.4 MG CAPS capsule TAKE 1 CAPSULE BY MOUTH DAILY   furosemide  (LASIX ) 20 MG tablet TAKE 1 TABLET BY MOUTH DAILY AS NEEDED FOR INCREASED ANKLE SWELLING   omeprazole  (PRILOSEC ) 20 MG capsule Take 1 capsule (20 mg total) by mouth 2 (two) times daily for 42 days, THEN 1 capsule (20 mg total) daily   potassium chloride  SA (KLOR-CON  M) 20 MEQ tablet Take 1 tablet (20 mEq total) by mouth once daily (Patient not taking: Reported on 12/03/2023)   sacubitril -valsartan  (ENTRESTO ) 97-103 MG Take 1 tablet by mouth 2 (two) times daily.   tamsulosin  (FLOMAX ) 0.4 MG CAPS capsule Take 1 capsule (0.4 mg total) by mouth daily.   No facility-administered encounter medications on file as of 12/03/2023.    Past Medical History:  Diagnosis Date   COLONIC POLYPS, BENIGN 01/20/2010   Annotation: 12/2009: path benign; rpt 2016 Qualifier: Diagnosis of  By: Lelon RIGGERS, Scott     Hypertension     Past Surgical History:  Procedure Laterality Date   BIOPSY  02/18/2022   Procedure: BIOPSY;  Surgeon: San Sandor GAILS, DO;  [facility]: [facility];  Service: Gastroenterology;;   ESOPHAGOGASTRODUODENOSCOPY N/A 02/18/2022   Procedure: ESOPHAGOGASTRODUODENOSCOPY (EGD);  Surgeon: San Sandor GAILS, DO;  Location: [facility];  Service: Gastroenterology;  Laterality: N/A;   POLYPECTOMY     RIGHT/LEFT HEART CATH AND CORONARY ANGIOGRAPHY N/A 08/27/2016   Procedure: Right/Left Heart Cath and Coronary Angiography;  Surgeon: Ozell Fell, MD;  Location[facility] INVASIVE CV LAB;  Service: Cardiovascular;  Laterality: N/A;    Family History  Problem Relation Age of Onset   Colon cancer Brother    Diabetes Other    Hypertension Other    Esophageal cancer Neg Hx    Inflammatory  bowel disease Neg Hx    Liver disease Neg Hx    Pancreatic cancer Neg Hx    Gastric cancer Neg Hx    Rectal cancer Neg Hx    Stomach cancer Neg Hx     Social History   Socioeconomic History   Marital status: Single    Spouse name: Not on file   Number of children: Not on file   Years of education: Not on file   Highest education level: Not on file  Occupational History   Not on file  Tobacco Use   Smoking status: Never   Smokeless tobacco: Never  Substance and Sexual Activity   Alcohol use: Yes    Comment: 3-4 Beers Daily   Drug use: Yes    Types: Marijuana    Comment: pt reports marijuana use occasionally   Sexual activity: Yes    Birth control/protection: None  Other Topics Concern   Not on file  Social History Narrative   Not on file   Social Drivers of Health   Financial Resource Strain: Low Risk  (09/04/2022)   Overall Financial Resource Strain (CARDIA)    Difficulty of Paying Living Expenses: Not very hard  Food Insecurity: No Food Insecurity (02/15/2022)   Hunger Vital Sign    Worried About Running Out of Food in the Last Year: Never true    Ran Out of Food in the Last Year: Never true  Transportation Needs: No Transportation Needs (02/15/2022)   PRAPARE - Administrator, Civil Service (Medical): No    Lack of Transportation (Non-Medical): No  Physical Activity: Inactive (09/04/2022)   Exercise Vital Sign    Days of Exercise per Week: 0 days    Minutes of Exercise per Session: 0 min  Stress: No Stress Concern Present (09/04/2022)   [employer] of Occupational Health - Occupational Stress Questionnaire    Feeling of Stress : Not at all  Social Connections: Not on file  Intimate Partner Violence: Not At Risk (02/15/2022)   Humiliation, Afraid, Rape, and Kick questionnaire    Fear of Current or Ex-Partner: No    Emotionally Abused: No    Physically Abused: No    Sexually Abused: No    Review of Systems  All other systems reviewed and are  negative.       Objective    BP (!) 163/102   Pulse 95   Ht 6' 5 (1.956 m)   Wt 210 lb (95.3 kg)   SpO2 95%   BMI 24.90 kg/m   Physical Exam Vitals and nursing note reviewed.  Constitutional:      General: He is not in acute distress.    Appearance: He is well-developed.  Cardiovascular:     Rate and Rhythm: Normal rate and regular rhythm.  Pulmonary:     Effort: Pulmonary effort is normal.  Breath sounds: Normal breath sounds.  Skin:    General: Skin is warm and dry.  Neurological:     Mental Status: He is alert and oriented to person, place, and time.         Assessment & Plan:   Essential hypertension  Left ankle swelling -     Furosemide ; TAKE 1 TABLET BY MOUTH DAILY AS NEEDED FOR INCREASED ANKLE SWELLING  Dispense: 90 tablet; Refill: 0  Chronic systolic CHF (congestive heart failure) (HCC) -     Sacubitril -Valsartan ; Take 1 tablet by mouth 2 (two) times daily.  Dispense: 180 tablet; Refill: 0  Benign prostatic hyperplasia, unspecified whether lower urinary tract symptoms present -     Tamsulosin  HCl; Take 1 capsule (0.4 mg total) by mouth daily.  Dispense: 30 capsule; Refill: 2  Other orders -     Omeprazole ; Take 1 capsule (20 mg total) by mouth 2 (two) times daily for 42 days, THEN 1 capsule (20 mg total) daily  Dispense: 90 capsule; Refill: 0 -     Carvedilol ; Take 1 tablet (25 mg total) by mouth 2 (two) times daily with a meal.  Dispense: 60 tablet; Refill: 3 -     Spironolactone ; Take 1 tablet (50 mg total) by mouth daily.  Dispense: 90 tablet; Refill: 0   Elevated readings. Meds refilled.   Return in about 2 weeks (around 12/17/2023) for follow up.   Tanda Raguel SQUIBB, MD

## 2023-12-18 ENCOUNTER — Encounter: Payer: Self-pay | Admitting: Family Medicine

## 2023-12-18 ENCOUNTER — Ambulatory Visit: Admitting: Family Medicine

## 2023-12-18 VITALS — BP 117/79 | HR 78 | Ht 77.0 in | Wt 210.0 lb

## 2023-12-18 DIAGNOSIS — I10 Essential (primary) hypertension: Secondary | ICD-10-CM | POA: Diagnosis not present

## 2023-12-18 DIAGNOSIS — I5022 Chronic systolic (congestive) heart failure: Secondary | ICD-10-CM

## 2023-12-18 DIAGNOSIS — M25472 Effusion, left ankle: Secondary | ICD-10-CM

## 2023-12-18 MED ORDER — POTASSIUM CHLORIDE CRYS ER 20 MEQ PO TBCR: 20.0000 meq | EXTENDED_RELEASE_TABLET | ORAL | 1 refills | Status: DC

## 2023-12-18 MED ORDER — FUROSEMIDE 20 MG PO TABS: ORAL_TABLET | ORAL | 1 refills | Status: DC

## 2023-12-18 MED ORDER — CARVEDILOL 25 MG PO TABS: 25.0000 mg | ORAL_TABLET | Freq: Two times a day (BID) | ORAL | 1 refills | Status: AC

## 2023-12-18 MED ORDER — SPIRONOLACTONE 50 MG PO TABS: 50.0000 mg | ORAL_TABLET | Freq: Every day | ORAL | 1 refills | Status: AC

## 2023-12-18 MED ORDER — SACUBITRIL-VALSARTAN 97-103 MG PO TABS: 1.0000 | ORAL_TABLET | Freq: Two times a day (BID) | ORAL | 1 refills | Status: AC

## 2023-12-18 NOTE — Progress Notes (Unsigned)
 Established Patient Office Visit  Subjective    Patient ID: [name] [name], male    DOB: Mar 03, 1971  Age: 53 y.o. MRN: [id number]  CC:  Chief Complaint  Patient presents with   Medical Management of Chronic Issues    HPI MAXDEN NAJI presents for follow up of hypertension. Patient reports med compliance and denies acute complaints.   Outpatient Encounter Medications as of 12/18/2023  Medication Sig   allopurinol  (ZYLOPRIM ) 100 MG tablet Take one daily for 1 week then 2 tablets each morning for one week then take 3 tablets daily.   allopurinol  (ZYLOPRIM ) 300 MG tablet Take 1 tablet (300 mg total) by mouth daily.   folic acid  (FOLVITE ) 1 MG tablet Take 1 tablet (1 mg total) by mouth daily.   Multiple Vitamin (MULTIVITAMIN WITH MINERALS) TABS tablet Take 1 tablet by mouth daily.   omeprazole  (PRILOSEC ) 20 MG capsule Take 1 capsule (20 mg total) by mouth 2 (two) times daily for 42 days, THEN 1 capsule (20 mg total) daily   tamsulosin  (FLOMAX ) 0.4 MG CAPS capsule Take 1 capsule (0.4 mg total) by mouth daily.   tamsulosin  (FLOMAX ) 0.4 MG CAPS capsule Take 1 capsule (0.4 mg total) by mouth daily.   thiamine  (VITAMIN B1) 100 MG tablet Take 1 tablet (100 mg total) by mouth daily.   Vitamin D , Ergocalciferol , (DRISDOL ) 1.25 MG (50000 UNIT) CAPS capsule Take 1 capsule (50,000 Units total) by mouth every 7 (seven) days.   [DISCONTINUED] carvedilol  (COREG ) 12.5 MG tablet Take 1 tablet (12.5 mg total) by mouth 2 (two) times daily.   [DISCONTINUED] carvedilol  (COREG ) 25 MG tablet Take 1 tablet (25 mg total) by mouth 2 (two) times daily with a meal.   [DISCONTINUED] furosemide  (LASIX ) 20 MG tablet Use 1 po daily prn for increased ankle swelling   [DISCONTINUED] furosemide  (LASIX ) 20 MG tablet TAKE 1 TABLET BY MOUTH DAILY AS NEEDED FOR INCREASED ANKLE SWELLING   [DISCONTINUED] omeprazole  (PRILOSEC ) 20 MG capsule TAKE 1 CAPSULE BY MOUTH DAILY   [DISCONTINUED] potassium chloride  SA (KLOR-CON  M) 20  MEQ tablet Take 1 tablet (20 mEq total) by mouth once daily   [DISCONTINUED] sacubitril -valsartan  (ENTRESTO ) 97-103 MG Take 1 tablet by mouth 2 (two) times daily.   [DISCONTINUED] spironolactone  (ALDACTONE ) 25 MG tablet Take 1/2 tablet (12.5 mg total) by mouth daily.   [DISCONTINUED] spironolactone  (ALDACTONE ) 50 MG tablet Take 1 tablet (50 mg total) by mouth daily.   carvedilol  (COREG ) 25 MG tablet Take 1 tablet (25 mg total) by mouth 2 (two) times daily with a meal.   furosemide  (LASIX ) 20 MG tablet TAKE 1 TABLET BY MOUTH DAILY AS NEEDED FOR INCREASED ANKLE SWELLING   potassium chloride  SA (KLOR-CON  M) 20 MEQ tablet Take 1 tablet (20 mEq total) by mouth once daily   sacubitril -valsartan  (ENTRESTO ) 97-103 MG Take 1 tablet by mouth 2 (two) times daily.   spironolactone  (ALDACTONE ) 50 MG tablet Take 1 tablet (50 mg total) by mouth daily.   No facility-administered encounter medications on file as of 12/18/2023.    Past Medical History:  Diagnosis Date   COLONIC POLYPS, BENIGN 01/20/2010   Annotation: 12/2009: path benign; rpt 2016 Qualifier: Diagnosis of  By: Lelon RIGGERS, Scott     Hypertension     Past Surgical History:  Procedure Laterality Date   BIOPSY  02/18/2022   Procedure: BIOPSY;  Surgeon: San Sandor GAILS, DO;  Location: [facility];  Service: Gastroenterology;;   ESOPHAGOGASTRODUODENOSCOPY N/A 02/18/2022   Procedure: ESOPHAGOGASTRODUODENOSCOPY (  EGD);  Surgeon: San Sandor GAILS, DO;  Location: [facility];  Service: Gastroenterology;  Laterality: N/A;   POLYPECTOMY     RIGHT/LEFT HEART CATH AND CORONARY ANGIOGRAPHY N/A 08/27/2016   Procedure: Right/Left Heart Cath and Coronary Angiography;  Surgeon: Ozell Fell, MD;  [facility]: [facility] INVASIVE CV LAB;  Service: Cardiovascular;  Laterality: N/A;    Family History  Problem Relation Age of Onset   Colon cancer Brother    Diabetes Other    Hypertension Other    Esophageal cancer Neg Hx    Inflammatory bowel disease Neg Hx     Liver disease Neg Hx    Pancreatic cancer Neg Hx    Gastric cancer Neg Hx    Rectal cancer Neg Hx    Stomach cancer Neg Hx     Social History   Socioeconomic History   Marital status: Single    Spouse name: Not on file   Number of children: Not on file   Years of education: Not on file   Highest education level: 12th grade  Occupational History   Not on file  Tobacco Use   Smoking status: Never   Smokeless tobacco: Never  Substance and Sexual Activity   Alcohol use: Yes    Comment: 3-4 Beers Daily   Drug use: Yes    Types: Marijuana    Comment: pt reports marijuana use occasionally   Sexual activity: Yes    Birth control/protection: None  Other Topics Concern   Not on file  Social History Narrative   Not on file   Social Drivers of Health   Financial Resource Strain: High Risk (12/15/2023)   Overall Financial Resource Strain (CARDIA)    Difficulty of Paying Living Expenses: Very hard  Food Insecurity: Food Insecurity Present (12/15/2023)   Hunger Vital Sign    Worried About Running Out of Food in the Last Year: Often true    Ran Out of Food in the Last Year: Often true  Transportation Needs: No Transportation Needs (12/15/2023)   PRAPARE - Administrator, Civil Service (Medical): No    Lack of Transportation (Non-Medical): No  Physical Activity: Insufficiently Active (12/15/2023)   Exercise Vital Sign    Days of Exercise per Week: 2 days    Minutes of Exercise per Session: 10 min  Stress: Stress Concern Present (12/15/2023)   [employer] of Occupational Health - Occupational Stress Questionnaire    Feeling of Stress: To some extent  Social Connections: Unknown (12/15/2023)   Social Connection and Isolation Panel    Frequency of Communication with Friends and Family: More than three times a week    Frequency of Social Gatherings with Friends and Family: Once a week    Attends Religious Services: Patient declined    Database administrator or  Organizations: Patient declined    Attends Banker Meetings: Not on file    Marital Status: Patient declined  Intimate Partner Violence: Not At Risk (02/15/2022)   Humiliation, Afraid, Rape, and Kick questionnaire    Fear of Current or Ex-Partner: No    Emotionally Abused: No    Physically Abused: No    Sexually Abused: No    Review of Systems  All other systems reviewed and are negative.       Objective    BP 117/79   Pulse 78   Ht 6' 5 (1.956 m)   Wt 210 lb (95.3 kg)   SpO2 96%   BMI 24.90 kg/m  Physical Exam Vitals and nursing note reviewed.  Constitutional:      General: He is not in acute distress.    Appearance: He is well-developed.  Cardiovascular:     Rate and Rhythm: Normal rate and regular rhythm.  Pulmonary:     Effort: Pulmonary effort is normal.     Breath sounds: Normal breath sounds.  Skin:    General: Skin is warm and dry.  Neurological:     Mental Status: He is alert and oriented to person, place, and time.         Assessment & Plan:   Essential hypertension -     Furosemide ; TAKE 1 TABLET BY MOUTH DAILY AS NEEDED FOR INCREASED ANKLE SWELLING  Dispense: 90 tablet; Refill: 1  Chronic systolic CHF (congestive heart failure) (HCC) -     Sacubitril -Valsartan ; Take 1 tablet by mouth 2 (two) times daily.  Dispense: 180 tablet; Refill: 1  Other orders -     Carvedilol ; Take 1 tablet (25 mg total) by mouth 2 (two) times daily with a meal.  Dispense: 180 tablet; Refill: 1 -     Spironolactone ; Take 1 tablet (50 mg total) by mouth daily.  Dispense: 90 tablet; Refill: 1 -     Potassium Chloride  [facility] ER; Take 1 tablet (20 mEq total) by mouth once daily  Dispense: 10 tablet; Refill: 1   Improved with compliance. Continue   Return for physical.   Tanda Raguel SQUIBB, MD

## 2023-12-19 ENCOUNTER — Encounter: Payer: Self-pay | Admitting: Family Medicine

## 2024-01-29 ENCOUNTER — Encounter: Payer: Self-pay | Admitting: Family Medicine

## 2024-01-29 ENCOUNTER — Ambulatory Visit (INDEPENDENT_AMBULATORY_CARE_PROVIDER_SITE_OTHER): Admitting: Family Medicine

## 2024-01-29 VITALS — BP 101/69 | HR 81 | Ht 77.0 in | Wt 205.8 lb

## 2024-01-29 DIAGNOSIS — Z0000 Encounter for general adult medical examination without abnormal findings: Secondary | ICD-10-CM

## 2024-01-29 DIAGNOSIS — Z130 Encounter for screening for diseases of the blood and blood-forming organs and certain disorders involving the immune mechanism: Secondary | ICD-10-CM | POA: Diagnosis not present

## 2024-01-29 DIAGNOSIS — Z1329 Encounter for screening for other suspected endocrine disorder: Secondary | ICD-10-CM

## 2024-01-29 DIAGNOSIS — Z13228 Encounter for screening for other metabolic disorders: Secondary | ICD-10-CM

## 2024-01-29 DIAGNOSIS — Z13220 Encounter for screening for lipoid disorders: Secondary | ICD-10-CM | POA: Diagnosis not present

## 2024-01-29 NOTE — Progress Notes (Signed)
 Established Patient Office Visit  Subjective    Patient ID: [name] [name], male    DOB: [birth year]  Age: 53 y.o. MRN: [id number]  CC:  Chief Complaint  Patient presents with   Annual Exam    HPI [name] [name] presents for routine annual exam. Denies acute complaints.   Outpatient Encounter Medications as of 01/29/2024  Medication Sig   allopurinol  (ZYLOPRIM ) 100 MG tablet Take one daily for 1 week then 2 tablets each morning for one week then take 3 tablets daily.   carvedilol  (COREG ) 25 MG tablet Take 1 tablet (25 mg total) by mouth 2 (two) times daily with a meal.   folic acid  (FOLVITE ) 1 MG tablet Take 1 tablet (1 mg total) by mouth daily.   furosemide  (LASIX ) 20 MG tablet TAKE 1 TABLET BY MOUTH DAILY AS NEEDED FOR INCREASED ANKLE SWELLING   Multiple Vitamin (MULTIVITAMIN WITH MINERALS) TABS tablet Take 1 tablet by mouth daily.   omeprazole  (PRILOSEC ) 20 MG capsule Take 1 capsule (20 mg total) by mouth 2 (two) times daily for 42 days, THEN 1 capsule (20 mg total) daily   potassium chloride  SA (KLOR-CON  M) 20 MEQ tablet Take 1 tablet (20 mEq total) by mouth once daily   sacubitril -valsartan  (ENTRESTO ) 97-103 MG Take 1 tablet by mouth 2 (two) times daily.   spironolactone  (ALDACTONE ) 50 MG tablet Take 1 tablet (50 mg total) by mouth daily.   tamsulosin  (FLOMAX ) 0.4 MG CAPS capsule Take 1 capsule (0.4 mg total) by mouth daily.   tamsulosin  (FLOMAX ) 0.4 MG CAPS capsule Take 1 capsule (0.4 mg total) by mouth daily.   thiamine  (VITAMIN B1) 100 MG tablet Take 1 tablet (100 mg total) by mouth daily.   Vitamin D , Ergocalciferol , (DRISDOL ) 1.25 MG (50000 UNIT) CAPS capsule Take 1 capsule (50,000 Units total) by mouth every 7 (seven) days.   allopurinol  (ZYLOPRIM ) 300 MG tablet Take 1 tablet (300 mg total) by mouth daily.   No facility-administered encounter medications on file as of 01/29/2024.    Past Medical History:  Diagnosis Date   COLONIC POLYPS, BENIGN 01/20/2010    Annotation: 12/2009: path benign; rpt 2016 Qualifier: Diagnosis of  By: Lelon RIGGERS, Scott     Hypertension     Past Surgical History:  Procedure Laterality Date   BIOPSY  02/18/2022   Procedure: BIOPSY;  Surgeon: San Sandor GAILS, DO;  Location: [facility];  Service: Gastroenterology;;   ESOPHAGOGASTRODUODENOSCOPY N/A 02/18/2022   Procedure: ESOPHAGOGASTRODUODENOSCOPY (EGD);  Surgeon: San Sandor GAILS, DO;  Location: [facility];  Service: Gastroenterology;  Laterality: N/A;   POLYPECTOMY     RIGHT/LEFT HEART CATH AND CORONARY ANGIOGRAPHY N/A 08/27/2016   Procedure: Right/Left Heart Cath and Coronary Angiography;  Surgeon: Ozell Fell, MD;  Location: [facility] INVASIVE CV LAB;  Service: Cardiovascular;  Laterality: N/A;    Family History  Problem Relation Age of Onset   Colon cancer Brother    Diabetes Other    Hypertension Other    Esophageal cancer Neg Hx    Inflammatory bowel disease Neg Hx    Liver disease Neg Hx    Pancreatic cancer Neg Hx    Gastric cancer Neg Hx    Rectal cancer Neg Hx    Stomach cancer Neg Hx     Social History   Socioeconomic History   Marital status: Single    Spouse name: Not on file   Number of children: Not on file   Years of education: Not on file  Highest education level: 12th grade  Occupational History   Not on file  Tobacco Use   Smoking status: Never   Smokeless tobacco: Never  Substance and Sexual Activity   Alcohol use: Yes    Comment: 3-4 Beers Daily   Drug use: Yes    Types: Marijuana    Comment: pt reports marijuana use occasionally   Sexual activity: Yes    Birth control/protection: None  Other Topics Concern   Not on file  Social History Narrative   Not on file   Social Drivers of Health   Financial Resource Strain: High Risk (12/15/2023)   Overall Financial Resource Strain (CARDIA)    Difficulty of Paying Living Expenses: Very hard  Food Insecurity: Food Insecurity Present (12/15/2023)   Hunger Vital Sign     Worried About Running Out of Food in the Last Year: Often true    Ran Out of Food in the Last Year: Often true  Transportation Needs: No Transportation Needs (12/15/2023)   PRAPARE - Administrator, Civil Service (Medical): No    Lack of Transportation (Non-Medical): No  Physical Activity: Insufficiently Active (12/15/2023)   Exercise Vital Sign    Days of Exercise per Week: 2 days    Minutes of Exercise per Session: 10 min  Stress: Stress Concern Present (12/15/2023)   [employer] of Occupational Health - Occupational Stress Questionnaire    Feeling of Stress: To some extent  Social Connections: Unknown (12/15/2023)   Social Connection and Isolation Panel    Frequency of Communication with Friends and Family: More than three times a week    Frequency of Social Gatherings with Friends and Family: Once a week    Attends Religious Services: Patient declined    Database administrator or Organizations: Patient declined    Attends Banker Meetings: Not on file    Marital Status: Patient declined  Intimate Partner Violence: Not At Risk (02/15/2022)   Humiliation, Afraid, Rape, and Kick questionnaire    Fear of Current or Ex-Partner: No    Emotionally Abused: No    Physically Abused: No    Sexually Abused: No    Review of Systems  All other systems reviewed and are negative.       Objective    BP 101/69   Pulse 81   Ht 6' 5 (1.956 m)   Wt 205 lb 12.8 oz (93.4 kg)   SpO2 95%   BMI 24.40 kg/m   Physical Exam Vitals and nursing note reviewed.  Constitutional:      General: He is not in acute distress. HENT:     Head: Normocephalic and atraumatic.     Right Ear: Tympanic membrane, ear canal and external ear normal.     Left Ear: Tympanic membrane, ear canal and external ear normal.     Nose: Nose normal.     Mouth/Throat:     Mouth: Mucous membranes are moist.     Pharynx: Oropharynx is clear.  Eyes:     Conjunctiva/sclera: Conjunctivae  normal.     Pupils: Pupils are equal, round, and reactive to light.  Neck:     Thyroid : No thyromegaly.  Cardiovascular:     Rate and Rhythm: Normal rate and regular rhythm.     Heart sounds: Normal heart sounds. No murmur heard. Pulmonary:     Effort: Pulmonary effort is normal.     Breath sounds: Normal breath sounds.  Abdominal:     General: There is  no distension.     Palpations: Abdomen is soft. There is no mass.     Tenderness: There is no abdominal tenderness.     Hernia: There is no hernia in the left inguinal area or right inguinal area.  Musculoskeletal:        General: Normal range of motion.     Cervical back: Normal range of motion and neck supple.     Right lower leg: No edema.     Left lower leg: No edema.  Skin:    General: Skin is warm and dry.  Neurological:     General: No focal deficit present.     Mental Status: He is alert and oriented to person, place, and time. Mental status is at baseline.  Psychiatric:        Mood and Affect: Mood normal.        Behavior: Behavior normal.         Assessment & Plan:   Annual physical exam -     Comprehensive metabolic panel with GFR  Screening for deficiency anemia -     CBC with Differential/Platelet  Screening for lipid disorders -     Lipid panel  Screening for endocrine/metabolic/immunity disorders -     Hemoglobin A1c     No follow-ups on file.   Tanda Raguel SQUIBB, MD

## 2024-01-30 LAB — COMPREHENSIVE METABOLIC PANEL WITH GFR
ALT: 36 IU/L (ref 0–44)
AST: 87 IU/L — ABNORMAL HIGH (ref 0–40)
Albumin: 4.3 g/dL (ref 3.8–4.9)
Alkaline Phosphatase: 93 IU/L (ref 44–121)
BUN/Creatinine Ratio: 9 (ref 9–20)
BUN: 15 mg/dL (ref 6–24)
Bilirubin Total: 0.9 mg/dL (ref 0.0–1.2)
CO2: 25 mmol/L (ref 20–29)
Calcium: 9.7 mg/dL (ref 8.7–10.2)
Chloride: 91 mmol/L — ABNORMAL LOW (ref 96–106)
Creatinine, Ser: 1.73 mg/dL — ABNORMAL HIGH (ref 0.76–1.27)
Globulin, Total: 4.1 g/dL (ref 1.5–4.5)
Glucose: 72 mg/dL (ref 70–99)
Potassium: 5 mmol/L (ref 3.5–5.2)
Sodium: 132 mmol/L — ABNORMAL LOW (ref 134–144)
Total Protein: 8.4 g/dL (ref 6.0–8.5)
eGFR: 46 mL/min/1.73 — ABNORMAL LOW (ref >59)

## 2024-01-30 LAB — CBC WITH DIFFERENTIAL/PLATELET
Basophils Absolute: 0 x10E3/uL (ref 0.0–0.2)
Basos: 1 %
EOS (ABSOLUTE): 0.1 x10E3/uL (ref 0.0–0.4)
Eos: 3 %
Hematocrit: 44.9 % (ref 37.5–51.0)
Hemoglobin: 14.7 g/dL (ref 13.0–17.7)
Immature Grans (Abs): 0 x10E3/uL (ref 0.0–0.1)
Immature Granulocytes: 0 %
Lymphocytes Absolute: 2.2 x10E3/uL (ref 0.7–3.1)
Lymphs: 45 %
MCH: 32.4 pg (ref 26.6–33.0)
MCHC: 32.7 g/dL (ref 31.5–35.7)
MCV: 99 fL — ABNORMAL HIGH (ref 79–97)
Monocytes Absolute: 0.6 x10E3/uL (ref 0.1–0.9)
Monocytes: 13 %
Neutrophils Absolute: 1.8 x10E3/uL (ref 1.4–7.0)
Neutrophils: 38 %
Platelets: 201 x10E3/uL (ref 150–450)
RBC: 4.54 x10E6/uL (ref 4.14–5.80)
RDW: 13.6 % (ref 11.6–15.4)
WBC: 4.7 x10E3/uL (ref 3.4–10.8)

## 2024-01-30 LAB — LIPID PANEL
Chol/HDL Ratio: 3.4 ratio (ref 0.0–5.0)
Cholesterol, Total: 191 mg/dL (ref 100–199)
HDL: 56 mg/dL (ref >39)
LDL Chol Calc (NIH): 119 mg/dL — ABNORMAL HIGH (ref 0–99)
Triglycerides: 88 mg/dL (ref 0–149)
VLDL Cholesterol Cal: 16 mg/dL (ref 5–40)

## 2024-01-30 LAB — HEMOGLOBIN A1C
Est. average glucose Bld gHb Est-mCnc: 128 mg/dL
Hgb A1c MFr Bld: 6.1 % — ABNORMAL HIGH (ref 4.8–5.6)

## 2024-02-03 ENCOUNTER — Other Ambulatory Visit: Payer: Self-pay | Admitting: Family Medicine

## 2024-02-14 ENCOUNTER — Ambulatory Visit: Payer: Self-pay | Admitting: Family Medicine

## 2024-02-14 NOTE — Progress Notes (Signed)
 Called pt and gave results. Pt expressed understanding. Scheduled OV with patient to recheck labs on 04-10-24 at 10:20 am

## 2024-03-04 ENCOUNTER — Other Ambulatory Visit: Payer: Self-pay | Admitting: Family Medicine

## 2024-03-23 ENCOUNTER — Encounter: Payer: Self-pay | Admitting: Radiology

## 2024-04-10 ENCOUNTER — Ambulatory Visit: Admitting: Family Medicine

## 2024-04-10 ENCOUNTER — Encounter: Payer: Self-pay | Admitting: Family Medicine

## 2024-04-10 VITALS — BP 148/113 | HR 111 | Ht 77.0 in | Wt 213.8 lb

## 2024-04-10 DIAGNOSIS — I10 Essential (primary) hypertension: Secondary | ICD-10-CM | POA: Diagnosis not present

## 2024-04-10 DIAGNOSIS — N400 Enlarged prostate without lower urinary tract symptoms: Secondary | ICD-10-CM

## 2024-04-10 DIAGNOSIS — R944 Abnormal results of kidney function studies: Secondary | ICD-10-CM | POA: Diagnosis not present

## 2024-04-10 MED ORDER — FOLIC ACID 1 MG PO TABS: 1.0000 mg | ORAL_TABLET | Freq: Every day | ORAL | 2 refills | Status: AC

## 2024-04-10 MED ORDER — TAMSULOSIN HCL 0.4 MG PO CAPS: 0.4000 mg | ORAL_CAPSULE | Freq: Every day | ORAL | 2 refills | Status: AC

## 2024-04-10 MED ORDER — POTASSIUM CHLORIDE CRYS ER 20 MEQ PO TBCR: 20.0000 meq | EXTENDED_RELEASE_TABLET | Freq: Every day | ORAL | 1 refills | Status: DC

## 2024-04-10 MED ORDER — THIAMINE HCL 100 MG PO TABS: 100.0000 mg | ORAL_TABLET | Freq: Every day | ORAL | 0 refills | Status: AC

## 2024-04-10 MED ORDER — FUROSEMIDE 20 MG PO TABS: ORAL_TABLET | ORAL | 1 refills | Status: AC

## 2024-04-11 LAB — BASIC METABOLIC PANEL WITH GFR
BUN/Creatinine Ratio: 9 (ref 9–20)
BUN: 6 mg/dL (ref 6–24)
CO2: 21 mmol/L (ref 20–29)
Calcium: 9.1 mg/dL (ref 8.7–10.2)
Chloride: 95 mmol/L — ABNORMAL LOW (ref 96–106)
Creatinine, Ser: 0.64 mg/dL — ABNORMAL LOW (ref 0.76–1.27)
Sodium: 138 mmol/L (ref 134–144)
eGFR: 112 mL/min/1.73 (ref >59)

## 2024-04-13 ENCOUNTER — Ambulatory Visit: Payer: Self-pay | Admitting: Family Medicine

## 2024-04-14 ENCOUNTER — Encounter: Payer: Self-pay | Admitting: Family Medicine

## 2024-04-14 NOTE — Progress Notes (Signed)
 Established Patient Office Visit  Subjective    Patient ID: [name] [name], male    DOB: 04/04/1971  Age: 53 y.o. MRN: [id number]  CC:  Chief Complaint  Patient presents with   Medical Management of Chronic Issues    Follow up on lab work and medications     HPI [name] [name] presents for routine follow up of hypertension. He also wants to follow up on his recent labs.  Patient reports med compliance and denies acute complaints.   Outpatient Encounter Medications as of 04/10/2024  Medication Sig   allopurinol  (ZYLOPRIM ) 300 MG tablet Take 1 tablet (300 mg total) by mouth daily.   carvedilol  (COREG ) 25 MG tablet Take 1 tablet (25 mg total) by mouth 2 (two) times daily with a meal.   Multiple Vitamin (MULTIVITAMIN WITH MINERALS) TABS tablet Take 1 tablet by mouth daily.   omeprazole  (PRILOSEC ) 20 MG capsule TAKE 1 CAPSULE BY MOUTH TWICE DAILY FOR 42 DAYS THEN 1 CAPSULE DAILY   sacubitril -valsartan  (ENTRESTO ) 97-103 MG Take 1 tablet by mouth 2 (two) times daily.   spironolactone  (ALDACTONE ) 50 MG tablet Take 1 tablet (50 mg total) by mouth daily.   Vitamin D , Ergocalciferol , (DRISDOL ) 1.25 MG (50000 UNIT) CAPS capsule Take 1 capsule (50,000 Units total) by mouth every 7 (seven) days.   allopurinol  (ZYLOPRIM ) 100 MG tablet Take one daily for 1 week then 2 tablets each morning for one week then take 3 tablets daily.   folic acid  (FOLVITE ) 1 MG tablet Take 1 tablet (1 mg total) by mouth daily.   furosemide  (LASIX ) 20 MG tablet TAKE 1 TABLET BY MOUTH DAILY AS NEEDED FOR INCREASED ANKLE SWELLING   potassium chloride  SA (KLOR-CON  M) 20 MEQ tablet Take 1 tablet (20 mEq total) by mouth daily.   tamsulosin  (FLOMAX ) 0.4 MG CAPS capsule Take 1 capsule (0.4 mg total) by mouth daily.   tamsulosin  (FLOMAX ) 0.4 MG CAPS capsule Take 1 capsule (0.4 mg total) by mouth daily.   thiamine  (VITAMIN B1) 100 MG tablet Take 1 tablet (100 mg total) by mouth daily.   [DISCONTINUED] folic acid  (FOLVITE ) 1 MG  tablet Take 1 tablet (1 mg total) by mouth daily.   [DISCONTINUED] furosemide  (LASIX ) 20 MG tablet TAKE 1 TABLET BY MOUTH DAILY AS NEEDED FOR INCREASED ANKLE SWELLING   [DISCONTINUED] potassium chloride  SA (KLOR-CON  M) 20 MEQ tablet TAKE 1 TABLET BY MOUTH DAILY   [DISCONTINUED] tamsulosin  (FLOMAX ) 0.4 MG CAPS capsule Take 1 capsule (0.4 mg total) by mouth daily.   [DISCONTINUED] thiamine  (VITAMIN B1) 100 MG tablet Take 1 tablet (100 mg total) by mouth daily.   No facility-administered encounter medications on file as of 04/10/2024.    Past Medical History:  Diagnosis Date   COLONIC POLYPS, BENIGN 01/20/2010   Annotation: 12/2009: path benign; rpt 2016 Qualifier: Diagnosis of  By: Lelon RIGGERS, Scott     Hypertension     Past Surgical History:  Procedure Laterality Date   BIOPSY  02/18/2022   Procedure: BIOPSY;  Surgeon: San Sandor GAILS, DO;  Location: [facility];  Service: Gastroenterology;;   ESOPHAGOGASTRODUODENOSCOPY N/A 02/18/2022   Procedure: ESOPHAGOGASTRODUODENOSCOPY (EGD);  Surgeon: San Sandor GAILS, DO;  Location: [facility];  Service: Gastroenterology;  Laterality: N/A;   POLYPECTOMY     RIGHT/LEFT HEART CATH AND CORONARY ANGIOGRAPHY N/A 08/27/2016   Procedure: Right/Left Heart Cath and Coronary Angiography;  Surgeon: Ozell Fell, MD;  Location: [facility] INVASIVE CV LAB;  Service: Cardiovascular;  Laterality: N/A;  Family History  Problem Relation Age of Onset   Colon cancer Brother    Diabetes Other    Hypertension Other    Esophageal cancer Neg Hx    Inflammatory bowel disease Neg Hx    Liver disease Neg Hx    Pancreatic cancer Neg Hx    Gastric cancer Neg Hx    Rectal cancer Neg Hx    Stomach cancer Neg Hx     Social History   Socioeconomic History   Marital status: Single    Spouse name: Not on file   Number of children: Not on file   Years of education: Not on file   Highest education level: 12th grade  Occupational History   Not on file  Tobacco  Use   Smoking status: Never   Smokeless tobacco: Never  Substance and Sexual Activity   Alcohol use: Yes    Comment: 3-4 Beers Daily   Drug use: Yes    Types: Marijuana    Comment: pt reports marijuana use occasionally   Sexual activity: Yes    Birth control/protection: None  Other Topics Concern   Not on file  Social History Narrative   Not on file   Social Drivers of Health   Financial Resource Strain: High Risk (04/10/2024)   Overall Financial Resource Strain (CARDIA)    Difficulty of Paying Living Expenses: Hard  Food Insecurity: No Food Insecurity (04/10/2024)   Hunger Vital Sign    Worried About Running Out of Food in the Last Year: Never true    Ran Out of Food in the Last Year: Never true  Transportation Needs: No Transportation Needs (04/10/2024)   PRAPARE - Administrator, Civil Service (Medical): No    Lack of Transportation (Non-Medical): No  Physical Activity: Sufficiently Active (04/10/2024)   Exercise Vital Sign    Days of Exercise per Week: 3 days    Minutes of Exercise per Session: 60 min  Stress: Stress Concern Present (04/10/2024)   [employer] of Occupational Health - Occupational Stress Questionnaire    Feeling of Stress: To some extent  Social Connections: Moderately Isolated (04/10/2024)   Social Connection and Isolation Panel    Frequency of Communication with Friends and Family: More than three times a week    Frequency of Social Gatherings with Friends and Family: More than three times a week    Attends Religious Services: More than 4 times per year    Active Member of [employer] or Organizations: No    Attends Banker Meetings: Never    Marital Status: Never married  Intimate Partner Violence: Not At Risk (04/10/2024)   Humiliation, Afraid, Rape, and Kick questionnaire    Fear of Current or Ex-Partner: No    Emotionally Abused: No    Physically Abused: No    Sexually Abused: No    Review of Systems  All other  systems reviewed and are negative.       Objective    BP (!) 148/113   Pulse (!) 111   Ht 6' 5 (1.956 m)   Wt 213 lb 12.8 oz (97 kg)   SpO2 94%   BMI 25.35 kg/m   Physical Exam Vitals and nursing note reviewed.  Constitutional:      General: He is not in acute distress.    Appearance: He is well-developed.  Cardiovascular:     Rate and Rhythm: Normal rate and regular rhythm.  Pulmonary:     Effort: Pulmonary effort is  normal.     Breath sounds: Normal breath sounds.  Skin:    General: Skin is warm and dry.  Neurological:     Mental Status: He is alert and oriented to person, place, and time.         Assessment & Plan:   Decreased GFR -     Basic metabolic panel with GFR  Benign prostatic hyperplasia, unspecified whether lower urinary tract symptoms present -     Tamsulosin  HCl; Take 1 capsule (0.4 mg total) by mouth daily.  Dispense: 30 capsule; Refill: 2  Essential hypertension -     Furosemide ; TAKE 1 TABLET BY MOUTH DAILY AS NEEDED FOR INCREASED ANKLE SWELLING  Dispense: 90 tablet; Refill: 1  Other orders -     Folic Acid ; Take 1 tablet (1 mg total) by mouth daily.  Dispense: 30 tablet; Refill: 2 -     Potassium Chloride  Crys ER; Take 1 tablet (20 mEq total) by mouth daily.  Dispense: 10 tablet; Refill: 1 -     Thiamine  HCl; Take 1 tablet (100 mg total) by mouth daily.  Dispense: 100 tablet; Refill: 0     Return in about 5 weeks (around 05/15/2024) for follow up.   Tanda Raguel SQUIBB, MD

## 2024-05-11 ENCOUNTER — Ambulatory Visit: Admitting: Family Medicine

## 2024-05-11 ENCOUNTER — Telehealth: Payer: Self-pay | Admitting: Family Medicine

## 2024-05-11 NOTE — Telephone Encounter (Signed)
 Called pt and left vm to call office back to reschedule missed appt

## 2024-06-22 ENCOUNTER — Other Ambulatory Visit: Payer: Self-pay | Admitting: Family Medicine

## 2024-06-24 NOTE — Telephone Encounter (Signed)
 Requested Prescriptions  Pending Prescriptions Disp Refills   potassium chloride  SA (KLOR-CON  M) 20 MEQ tablet [Pharmacy Med Name: potassium chloride  ER 20 mEq tablet,extended release(part/cryst)] 90 tablet 0    Sig: TAKE 1 TABLET BY MOUTH DAILY     Endocrinology:  Minerals - Potassium Supplementation Failed - 06/24/2024  8:16 AM      Failed - Cr in normal range and within 360 days    Creatinine, Ser  Date Value Ref Range Status  04/10/2024 0.64 (L) 0.76 - 1.27 mg/dL Final         Passed - K in normal range and within 360 days    Potassium  Date Value Ref Range Status  04/10/2024 CANCELED mmol/L     Comment:    Test not performed.  Serum was in contact with cells when received which will make the result inaccurate.  Result canceled by the ancillary.          Passed - Valid encounter within last 12 months    Recent Outpatient Visits           2 months ago Essential hypertension   [facility], MD   4 months ago Annual physical exam   [facility], MD   6 months ago Essential hypertension   [facility], MD   6 months ago Essential hypertension   [facility], MD   1 year ago Essential hypertension   [facility] PARAS, NP
